# Patient Record
Sex: Female | Born: 1970 | Race: White | Hispanic: No | Smoking: Never smoker
Health system: Southern US, Community
[De-identification: ages and names within clinical notes are randomized; demographics above are authoritative.]

## PROBLEM LIST (undated history)

## (undated) DIAGNOSIS — I4719 Other supraventricular tachycardia: Secondary | ICD-10-CM

## (undated) DIAGNOSIS — F32A Depression, unspecified: Secondary | ICD-10-CM

## (undated) DIAGNOSIS — R51 Headache: Secondary | ICD-10-CM

## (undated) DIAGNOSIS — R000 Tachycardia, unspecified: Secondary | ICD-10-CM

## (undated) DIAGNOSIS — N200 Calculus of kidney: Secondary | ICD-10-CM

## (undated) DIAGNOSIS — R55 Syncope and collapse: Secondary | ICD-10-CM

## (undated) DIAGNOSIS — E785 Hyperlipidemia, unspecified: Secondary | ICD-10-CM

## (undated) DIAGNOSIS — I471 Supraventricular tachycardia: Secondary | ICD-10-CM

## (undated) DIAGNOSIS — T7840XA Allergy, unspecified, initial encounter: Secondary | ICD-10-CM

## (undated) DIAGNOSIS — R002 Palpitations: Secondary | ICD-10-CM

## (undated) DIAGNOSIS — I499 Cardiac arrhythmia, unspecified: Secondary | ICD-10-CM

## (undated) DIAGNOSIS — M1990 Unspecified osteoarthritis, unspecified site: Secondary | ICD-10-CM

## (undated) DIAGNOSIS — R519 Headache, unspecified: Secondary | ICD-10-CM

## (undated) DIAGNOSIS — IMO0001 Reserved for inherently not codable concepts without codable children: Secondary | ICD-10-CM

## (undated) HISTORY — DX: Depression, unspecified: F32.A

## (undated) HISTORY — DX: Tachycardia, unspecified: R00.0

## (undated) HISTORY — DX: Hyperlipidemia, unspecified: E78.5

## (undated) HISTORY — PX: TONSILLECTOMY: SUR1361

## (undated) HISTORY — DX: Unspecified osteoarthritis, unspecified site: M19.90

## (undated) HISTORY — DX: Palpitations: R00.2

## (undated) HISTORY — DX: Allergy, unspecified, initial encounter: T78.40XA

---

## 2004-08-21 ENCOUNTER — Ambulatory Visit: Payer: Self-pay

## 2005-10-12 ENCOUNTER — Ambulatory Visit: Payer: Self-pay

## 2005-11-15 ENCOUNTER — Ambulatory Visit: Payer: Self-pay | Admitting: Obstetrics and Gynecology

## 2005-11-21 ENCOUNTER — Encounter: Payer: Self-pay | Admitting: Internal Medicine

## 2005-11-28 ENCOUNTER — Ambulatory Visit: Payer: Self-pay

## 2006-02-26 ENCOUNTER — Observation Stay: Payer: Self-pay | Admitting: Certified Nurse Midwife

## 2006-02-26 ENCOUNTER — Ambulatory Visit: Payer: Self-pay | Admitting: Obstetrics and Gynecology

## 2006-02-27 ENCOUNTER — Ambulatory Visit: Payer: Self-pay | Admitting: Obstetrics and Gynecology

## 2006-04-07 ENCOUNTER — Observation Stay: Payer: Self-pay | Admitting: Obstetrics and Gynecology

## 2006-04-25 ENCOUNTER — Inpatient Hospital Stay: Payer: Self-pay | Admitting: Obstetrics and Gynecology

## 2008-09-19 ENCOUNTER — Ambulatory Visit: Payer: Self-pay | Admitting: Family Medicine

## 2009-08-09 ENCOUNTER — Ambulatory Visit: Payer: Self-pay | Admitting: General Practice

## 2009-08-15 ENCOUNTER — Encounter: Payer: Self-pay | Admitting: General Practice

## 2009-09-09 ENCOUNTER — Encounter: Payer: Self-pay | Admitting: General Practice

## 2009-10-09 ENCOUNTER — Encounter: Payer: Self-pay | Admitting: General Practice

## 2010-07-10 ENCOUNTER — Emergency Department: Payer: Self-pay | Admitting: Emergency Medicine

## 2010-07-13 ENCOUNTER — Encounter: Payer: Self-pay | Admitting: Internal Medicine

## 2010-07-13 ENCOUNTER — Observation Stay: Payer: Self-pay | Admitting: Internal Medicine

## 2010-07-14 ENCOUNTER — Encounter: Payer: Self-pay | Admitting: Internal Medicine

## 2010-07-18 ENCOUNTER — Ambulatory Visit (INDEPENDENT_AMBULATORY_CARE_PROVIDER_SITE_OTHER): Payer: PRIVATE HEALTH INSURANCE | Admitting: Internal Medicine

## 2010-07-18 ENCOUNTER — Encounter: Payer: Self-pay | Admitting: Internal Medicine

## 2010-07-18 DIAGNOSIS — I498 Other specified cardiac arrhythmias: Secondary | ICD-10-CM | POA: Insufficient documentation

## 2010-07-18 DIAGNOSIS — R0989 Other specified symptoms and signs involving the circulatory and respiratory systems: Secondary | ICD-10-CM

## 2010-07-18 DIAGNOSIS — I459 Conduction disorder, unspecified: Secondary | ICD-10-CM | POA: Insufficient documentation

## 2010-07-18 DIAGNOSIS — R0609 Other forms of dyspnea: Secondary | ICD-10-CM

## 2010-07-18 DIAGNOSIS — Q078 Other specified congenital malformations of nervous system: Secondary | ICD-10-CM

## 2010-07-18 DIAGNOSIS — R002 Palpitations: Secondary | ICD-10-CM | POA: Insufficient documentation

## 2010-07-20 ENCOUNTER — Telehealth: Payer: Self-pay | Admitting: Internal Medicine

## 2010-07-20 ENCOUNTER — Encounter: Payer: Self-pay | Admitting: Internal Medicine

## 2010-07-20 DIAGNOSIS — I514 Myocarditis, unspecified: Secondary | ICD-10-CM | POA: Insufficient documentation

## 2010-07-24 ENCOUNTER — Telehealth: Payer: Self-pay | Admitting: Internal Medicine

## 2010-07-25 ENCOUNTER — Other Ambulatory Visit: Payer: Self-pay | Admitting: Internal Medicine

## 2010-07-25 DIAGNOSIS — I514 Myocarditis, unspecified: Secondary | ICD-10-CM

## 2010-07-26 ENCOUNTER — Ambulatory Visit (HOSPITAL_COMMUNITY)
Admission: RE | Admit: 2010-07-26 | Discharge: 2010-07-26 | Disposition: A | Discharge status: Home / Self Care | Payer: PRIVATE HEALTH INSURANCE | Source: Ambulatory Visit | Attending: Internal Medicine | Admitting: Internal Medicine

## 2010-07-26 DIAGNOSIS — I514 Myocarditis, unspecified: Secondary | ICD-10-CM | POA: Insufficient documentation

## 2010-07-26 MED ORDER — GADOPENTETATE DIMEGLUMINE 469.01 MG/ML IV SOLN
27.0000 mL | Freq: Once | INTRAVENOUS | Status: AC
  Administered 2010-07-26: 27 mL via INTRAVENOUS

## 2010-07-27 NOTE — Letter (Signed)
Summary: Work Writer at [employer]. [address]   [place], [place] [address]   Phone: [phone]  Fax: [phone]     July 20, 2010    Lisa Clay   The above named patient had a doctor appointment 07/18/10 @ 9:30am. Upon assessment, patient may work 2 hour shift today for medical reasons from 9:00am - 11:00am and will call our office to notify us if she can tolerate.  Please take this into consideration when reviewing the time away from work.  Feel free to contact our office with any questions or concerns at [phone].    Sincerely yours,       Sherryl Manges, MD  [facility]

## 2010-07-27 NOTE — Assessment & Plan Note (Signed)
Summary: SINUS TACHYCARDIA/PALPITATIONS/Referred by Patel/#[phone]...   Visit Type:  Initial Consult Primary Provider:  Dr. Terance Hart  CC:  c/o SOB on exertion and palpitations and tachycardia.Marland Kitchen  History of Present Illness: Ms. Detwiler is referred by Dr. Allena Katz from [facility] because of tachycardia.  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 It was thought that her tachycardia might be related to Allegra and/or coffee.   In the wake of that, she noted that she remained  dyspneic with exertion and that this was associated with a rapid rate with heart rates recorded in the 130-40 range. She was seen in the emergency room on 2 occasions and is admitted on one because of the aforementioned symptoms. During these visits she underwent CT scanning to exclude pulmonary embolism, fluid resuscitation, 2-D echo that was normal including left ventricular and pulmonary artery pressures (see below).Her TSH and hemoglobin were also normal  She has found that her heart rate has continued to be a problem associated with dyspnea.  Most of the time these symptoms track together. She is also had some palpitations where her heart rate has only been 80-90 range.  She was started on metoprolol at 12.5 daily; this has been associated with some improvement in her heart rate but not in her overall symptoms.  She remains quite fatigued area and she is having difficulty with concentration. She has been sleeping much more than her normal 3-4 hours a night.  she has noted some recent orthostatic intolerance. She denies sour intolerance. Her urine color suggest that she is volume repleted; her diet is probably average her salt intake.  she denies significant psychosocial stress apart from what is secondary to her acute symptoms. She notes that she was taking an appetite suppressant from the summer  through December. This is true not withstanding the fact that she is quite. He denies anorexia and or bulimia as his only that she is "very conscientious about her weight".    Preventive Screening-Counseling & Management  Caffeine-Diet-Exercise     Does Patient Exercise: no  Current Medications (verified): 1)  Metoprolol Succinate 25 Mg Xr24h-Tab (Metoprolol Succinate) .... 1/2 Tablet Once Daily 2)  Ambien 5 Mg Tabs (Zolpidem Tartrate) .Marland Kitchen.. 1 To 2 Tablets At Bedtime As Needed 3)  Mirena 20 Mcg/24hr Iud (Levonorgestrel)  Allergies (verified): 1)  ! Erythromycin  Past History:  Past Medical History: Last updated: 07/17/2010 Palpitations-[facility]-07/2010 Sinus tachycardia-[facility]-07/2010  Family History: Last updated: 07/18/2010 Maternal Grandmother-massive MI age 38-deceased Father: Hypertension, High cholesterol Mother:Hypertension, High cholesterol  Social History: Last updated: 07/18/2010 Full Time Tobacco Use - No.  Alcohol Use - yes-occasional Drug Use - no single Regular Exercise - no  Risk Factors: Exercise: no (07/18/2010)  Risk Factors: Smoking Status: never (07/17/2010)  Past Surgical History: Tonsillectomy  Family History: Maternal Grandmother-massive MI age 38-deceased Father: Hypertension, High cholesterol Mother:Hypertension, High cholesterol  Social History: Full Time Tobacco Use - No.  Alcohol Use - yes-occasional Drug Use - no single Regular Exercise - no Does Patient Exercise:  no  Review of Systems       full review of systems was negative apart from a history of present illness and past medical history allergic diathesis and menstrual dysfunction   Vital Signs:  Patient profile:   40 year old female Height:      66 inches Weight:      124 pounds  BMI:     20.09 Pulse rate:   85 / minute BP supine:   127 / 80  (left arm) BP sitting:   138 / 88  (left arm) BP standing:   128 / 88  (left arm) Cuff size:   regular  Vitals Entered By:  Lysbeth Galas CMA (July 18, 2010 9:54 AM)  Serial Vital Signs/Assessments:  Time      Position  BP       Pulse  Resp  Temp     By                     118/81   102                   Lysbeth Galas CMA   Physical Exam  General:  Well developed, well nourished,quite thin middle-aged Caucasian female appearing her stated agein no acute distress. Head:  normal HEENT Neck:  supple without thyromegaly Chest Wall:  without kyphosis Lungs:  clear to auscultation Heart:  regular rate and rhythm without murmurs or gallops Abdomen:  soft with active bowel sounds with abdomen pulsation or hepatomegaly Msk:  without obvious skeletal defects Pulses:  intact distal pulses Extremities:  without clubbing cyanosis or edema Neurologic:  alert and oriented and grossly normal motor and sensory function Skin:  warm and dry Cervical Nodes:  without adenopathy Psych:  somewhat flat affect   EKG  Procedure date:  07/18/2010  Findings:      sinus rhythm at 81 Intervals 0.1-6.09/23 8 Axis is 92 Otherwise normal  EKG  Procedure date:  07/13/2010  Findings:      2 different with her cardiograms are compared. P Wave morphologies compared to today's heart rate 81 shows 80 negative P wave in lead V2 otherwise the vectors are quite consistent  Impression & Recommendations:  Problem # 1:  SINUS TACHYCARDIA (ICD-427.89) the patient has supraventricular tachycardia with a long RP configuration. P Wave morphologies suggest that these are quite similar to sinus. The multiple rates of her rapid heart rate that has been described and recorded make me think it is unlikely that this represents atrial ectopic focus which would more likely had a narrower heart rate range. the alternative explanation to a primary arrhythmia would be secondary sinus tachycardia. There is no obvious trigger for this raising the possibility that this represents a dysautonomic process. He is not consistent with POTS in at the symptoms  occur while flat and were not really ameliorated by hydration. There is an orthostatic component however. 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  The dyspnea may well be explained by the dysautonomic process. However, there may be something else that has been missed here that is more cardio respiratory in origin. The fact that her echo cardiogram was normal makes this less likely. I will talk with colleagues about whether a myocarditis process is possible in the absence of left ventricular dysfunction which might be identifiable by MRI scanning.  In addition we will undertake the use of an event recorder as the transition from offset to onset and back might help [place] understand the mechanism of her tachycardia.  I have given her information from the  NDRF website I've also asked her to increase her metoprolol from 12.5-25 mg a day. She'll let us know later this week as she is feeling is  released to return to work.  I have also emphasized the fact that Dysautonomic processes are frequently associated with psychosocial stress. Also have to explore whether her exposure to appetite suppressants maycontribute Her updated medication list for this problem includes:    Metoprolol Succinate 25 Mg Xr24h-tab (Metoprolol succinate) .Marland Kitchen... Take one tablet once daily  Problem # 2:  PALPITATIONS, RECURRENT (ICD-785.1) she has 2 different palpitations syndrome. However, I think they probably related more to the right heart rate range and a different mechanism Her updated medication list for this problem includes:    Metoprolol Succinate 25 Mg Xr24h-tab (Metoprolol succinate) .Marland Kitchen... 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 Take one tablet once daily  Patient Instructions: 1)  Your physician recommends that you schedule a follow-up appointment in:  4 weeks. 2)  Your physician has recommended you make the following change in your medication:  Increase Metoprolol from 12.5mg  to 25mg  take one tablet daily.  3)  Your physician has recommended that you wear an event monitor.  Event monitors are medical devices that record the heart's electrical activity. Doctors most often use these monitors to diagnose arrhythmias. Arrhythmias are problems with the speed or rhythm of the heartbeat. The monitor is a small, portable device. You can wear one while you do your normal daily activities. This is usually used to diagnose what is causing palpitations/syncope (passing out). Prescriptions: METOPROLOL SUCCINATE 25 MG XR24H-TAB (METOPROLOL SUCCINATE) take one tablet once daily  #30 x 6   Entered by:   Lanny Hurst RN   Authorized by:   Nathen May, MD, [facility]   Signed by:   Lanny Hurst RN on 07/18/2010   Method used:   Electronically to        [facility] (retail)       [address]       [place], [place]  [address]       Ph: [phone]       Fax: [phone]   RxID:   [id number]

## 2010-07-27 NOTE — Letter (Signed)
Summary: Work Writer at [employer]. [address]   [place], [place] [address]   Phone: [phone]  Fax: [phone]     July 20, 2010    Lisa Clay   The above named patient needs to have a sedentary position at work until further notice. If you have any questions or concerns you may call Dr. Graciela Husbands at [facility] in [place] at [phone].    Sincerely yours,       Sherryl Manges, MD  [facility]

## 2010-07-27 NOTE — Progress Notes (Addendum)
Summary: Needing a letter for work for restrictions  Phone Note Call from Patient Call back at [employer] [phone]   Caller: Self Call For: Gollan Summary of Call: Pt is calling to inform that she is still SOB after working for 2  hours.  Pt states that she does not want to be out of work completely.  Pt is inquiring whether she can have a note with modifications for work.  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  Pt is also calling about the appt for a cardiac MRI.  Pt would prefer to be able to have a release to do some level of work, but does not feel comfortable working at a normal level.  Pt has been having episodes of the SOB and palpitations on exertion only.  This was the pt's first time returning to work since her appt here.   Initial call taken by: Harlon Flor,  July 20, 2010 11:34 AM     Appended Document: Needing a letter for work for restrictions do not know patient. Will forward to Dr. Graciela Husbands

## 2010-07-27 NOTE — Letter (Signed)
Summary: Letter  Letter   Imported By: Harlon Flor 07/21/2010 15:50:06  _____________________________________________________________________  External Attachment:    Type:   Image     Comment:   External Document

## 2010-07-27 NOTE — Miscellaneous (Signed)
Summary: Cardiac MRI orders  Clinical Lists Changes  Problems: Added new problem of UNSPECIFIED MYOCARDITIS (ICD-429.0) Orders: Added new Referral order of Cardiac MRI (Cardiac MRI) - Signed

## 2010-07-30 DIAGNOSIS — I517 Cardiomegaly: Secondary | ICD-10-CM

## 2010-08-02 NOTE — Letter (Signed)
Summary: Letter for Work  Letter for Work   Imported By: Harlon Flor 07/24/2010 15:13:25  _____________________________________________________________________  External Attachment:    Type:   Image     Comment:   External Document

## 2010-08-02 NOTE — Progress Notes (Signed)
Summary: Event monitor  Phone Note Call from Patient   Caller: Patient Details for Reason: Monitor [phone] Summary of Call: She received the monitor and thought it would be continuous monitoring but this monitor is only when she has a spell to push the button. Please call after 12:00, she is in meetings all morning. Initial call taken by: Bishop Dublin, CMA,  July 24, 2010 9:44 AM  Follow-up for Phone Call        Attempted to contact pt, LMOM TCB Lanny Hurst RN  July 25, 2010 10:28 AM   Spoke to pt, notified her that the Cougar of hearts that Dr. Graciela Husbands ordered only records when pt has symptoms, it does not continuously record. Pt is concerned that the rhythms that are important are not being captured with this type of monitor. Pt also states since taking metoprolol that this has helped some. Will talk to Dr. Graciela Husbands today to see what he recommends. Follow-up by: Lanny Hurst RN,  July 25, 2010 10:45 AM  Additional Follow-up for Phone Call Additional follow up Details #1::        Spoke to Dr. Graciela Husbands, pt's event monitor was changed to Explorer with 412 seconds pre-recording and 27 sec post recording. Also talked to [facility], pt's cardiac MRI is scheduled for tomorrow 07/26/10 @ 2:00pm. Attempted to call pt with this information, LMOM TCB Lanny Hurst RN  July 25, 2010 2:59 PM   Notified pt of above, pt will go for MRI tomorrow at [facility]. Additional Follow-up by: Lanny Hurst RN,  July 25, 2010 4:01 PM

## 2010-08-08 ENCOUNTER — Telehealth: Payer: Self-pay | Admitting: Internal Medicine

## 2010-08-15 ENCOUNTER — Encounter: Payer: Self-pay | Admitting: Internal Medicine

## 2010-08-15 ENCOUNTER — Ambulatory Visit (INDEPENDENT_AMBULATORY_CARE_PROVIDER_SITE_OTHER): Payer: PRIVATE HEALTH INSURANCE | Admitting: Internal Medicine

## 2010-08-15 DIAGNOSIS — Q078 Other specified congenital malformations of nervous system: Secondary | ICD-10-CM

## 2010-08-16 ENCOUNTER — Telehealth: Payer: Self-pay | Admitting: Internal Medicine

## 2010-08-16 ENCOUNTER — Encounter: Payer: Self-pay | Admitting: Internal Medicine

## 2010-08-22 NOTE — Letter (Signed)
Summary: FMLA papers  FMLA papers   Imported By: Harlon Flor 08/16/2010 16:05:08  _____________________________________________________________________  External Attachment:    Type:   Image     Comment:   External Document

## 2010-08-22 NOTE — Letter (Signed)
SummaryScientist, [facility]   [facility]   Imported By: Roderic Ovens 08/10/2010 11:28:20  _____________________________________________________________________  External Attachment:    Type:   Image     Comment:   External Document

## 2010-08-22 NOTE — Progress Notes (Signed)
Summary: Pulmonary appt  Phone Note Call from Patient Call back at Home Phone [phone]   Caller: Patient Call For: Nurse Summary of Call: Pt called wanting to be referred to pulmonary. Let pt know I will call her with appt. She wants to be referred to Dr. Meredeth Ide. Appt is made for 08/25/10 @9 :15am. pt notified/sab Initial call taken by: Lysbeth Galas CMA,  August 16, 2010 3:59 PM

## 2010-08-22 NOTE — Letter (Signed)
Summary: Generic Engineer, agricultural at [address]   [place], [place] [address]   Phone: [phone]  Fax: [phone]    08/16/2010  Lisa Clay [address] [place], [place]  [address]  [place]  Dear Ms. [name],    We have you scheduled with Dr. Luella Cook on 09/04/2010 @ 10:30. Arrive 15 minutes before appointment.   Address: [facility], [place] [address]  Phone number: [phone]      Sincerely,   Lysbeth Galas CMA

## 2010-08-22 NOTE — Progress Notes (Signed)
Summary: Wants call back  Phone Note Call from Patient Call back at Home Phone [phone]   Caller: Patient Call For: Lisa Clay/Megan Summary of Call: pt states she wants to talk to you. She states that she hasn't received a call from Dr. Graciela Husbands.  Initial call taken by: Lysbeth Galas CMA,  August 08, 2010 9:05 AM  Follow-up for Phone Call        saw pt in office today Follow-up by: Nathen May, MD, [facility],  August 15, 2010 6:17 PM

## 2010-08-22 NOTE — Letter (Signed)
Summary: Work Writer at [employer]. [address]   [place], [place] [address]   Phone: [phone]  Fax: [phone]     August 15, 2010    Lisa Clay   The above named patient had a medical visit today at:   08/15/10  @11  am.  Please take this into consideration when reviewing the time away from work until further notice.     Sincerely yours,  Architectural technologist

## 2010-08-22 NOTE — Letter (Signed)
Summary: [facility] Cardiology Consult, 24 Hour Holter Report   [facility] Cardiology Consult, 24 Hour Holter Report   Imported By: Roderic Ovens 08/10/2010 11:27:35  _____________________________________________________________________  External Attachment:    Type:   Image     Comment:   External Document

## 2010-08-22 NOTE — Assessment & Plan Note (Signed)
Summary: F4W/AMD   Visit Type:  Follow-up Primary Provider:  Dr. Terance Clay  CC:  "doing well". No problems since f/u. Denies chest pain and SOB.Marland Kitchen  History of Present Illness: Lisa Clay is seen in followup for tachycardia palpitations dyspnea on exertion that you've lost rather acutely about 6 or 8 weeks ago following a viral illness. Cardiac MRI failed to demonstrate any evidence of myocardial disease. As it recurs thus far with incomplete due to have failed to demonstrate anything besides gradually changing heart rates consistent with sinus tachycardia and my working diagnosis has been dysautonomia. [name] been tried on low-dose beta blockers. He is working on salt and water repletion.  SHe comes in today frustrated and angry the latter related to poor communication from our office which I am probably responsible and has been apologetic. She is also remains frustrated with her diagnosis and ongoing symptoms. She continues to have significant dyspnea with exertion and hence is concerned about being able to return to work  Current Medications (verified): 1)  Metoprolol Succinate 25 Mg Xr24h-Tab (Metoprolol Succinate) .... Take One Tablet Once Daily 2)  Ambien 5 Mg Tabs (Zolpidem Tartrate) .Marland Kitchen.. 1 To 2 Tablets At Bedtime As Needed 3)  Mirena 20 Mcg/24hr Iud (Levonorgestrel)  Allergies (verified): 1)  ! Erythromycin  Past History:  Past Medical History: Last updated: 07/17/2010 Palpitations-[facility]-07/2010 Sinus tachycardia-[facility]-07/2010  Past Surgical History: Last updated: 07/18/2010 Tonsillectomy  Family History: Last updated: 07/18/2010 Maternal Grandmother-massive MI age 76-deceased Father: Hypertension, High cholesterol Mother:Hypertension, High cholesterol  Social History: Last updated: 07/18/2010 Full Time Tobacco Use - No.  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 We spent about 30-35 minutes discussing the physiology the potential relationship to viral illness and outcomes. Also discussed treatments. We reviewed her excursion into NDRFc website. I think that there is more information there that she would benefit from.  She is quite unsettled with the diagnosis. I have told her given her dyspnea that she should see pulmonary. She will look into whether she wants to have it done here in [name] or with [name];  we will also refer her to Lisa Clay at [facility] for another opinion related to her tachycardia palpitations.  Patient Instructions: 1)  Your physician recommends that you continue on your current medications as directed. Please refer to the Current Medication list given to you today. 2)  Will call Lisa Clay office at [facility] to set you up with a appointment, appt set up for 3/26 @ 10:30 w/ LisaSimmons.

## 2010-08-25 ENCOUNTER — Other Ambulatory Visit: Payer: Self-pay | Admitting: Specialist

## 2010-08-29 ENCOUNTER — Telehealth: Payer: Self-pay | Admitting: Internal Medicine

## 2010-08-29 NOTE — Telephone Encounter (Signed)
Pt is f/u on the status of her short term disability papers.  The return date to work was missing and this was discussed with [place] Dec.  Pt stated that she was to receive a return call to inform her of once this has been corrected.  Pt has not heard anything in a week from our office. Please advise.

## 2010-08-29 NOTE — Telephone Encounter (Signed)
Pt. Called regarding short term disability papers. Left message to call back.

## 2010-08-31 ENCOUNTER — Ambulatory Visit: Payer: Self-pay | Admitting: Specialist

## 2010-08-31 NOTE — Telephone Encounter (Signed)
Please tell pt did not know how to fill in return to work status;  Anticipated that she was seeing Dr Sharol Harness at [facility]  3/26  And would suggest we wait for his opinion Thank steve

## 2010-09-01 NOTE — Telephone Encounter (Signed)
FYI

## 2010-09-01 NOTE — Telephone Encounter (Signed)
Spoke with patient and form not filled out all the way. Spoke with Dr.Klein and he wants to wait till pt sees Dr.Simmons at [facility] with patient and she understands that she needs to wait till after the visit with Dr.Simmons. Also patient is having last office visit, procedures, and lab work faxed over to our office from Dr. Reita Cliche office.

## 2010-09-19 ENCOUNTER — Other Ambulatory Visit: Payer: Self-pay | Admitting: General Practice

## 2010-09-28 ENCOUNTER — Telehealth: Payer: Self-pay | Admitting: Emergency Medicine

## 2010-09-28 NOTE — Telephone Encounter (Signed)
Pt called in today regarding Dr.Simmons. She states that Dr.Simmons was supposed to contact you (Dr.Klein) and Dr.Fleming regarding her care. Per Maryellen he has not done that. She was supposed to have a f/u appt with him in the next week or so but Dr.Simmons is out of town. The office won't give her a f/u appt. She feels like she is getting the run around with them. She wanted to know your recommendations. Pt is very frustrated.

## 2010-10-04 NOTE — Telephone Encounter (Signed)
Lisa Clay, I have little to add here unfortunately;  I dont know what Dr Sharol Harness said and she was fairly unsatisfied with my diagnosis and insights I look ofroward to hearing from Dr Sharol Harness

## 2010-11-02 ENCOUNTER — Other Ambulatory Visit: Payer: Self-pay | Admitting: Rheumatology

## 2010-12-22 ENCOUNTER — Encounter: Payer: Self-pay | Admitting: Internal Medicine

## 2011-02-15 ENCOUNTER — Telehealth: Payer: Self-pay | Admitting: Internal Medicine

## 2011-02-15 NOTE — Telephone Encounter (Signed)
Patient called and wasn't sure what she needs to do.  She was referred to Dr. Sharol Harness at [facility] for EP by DR. Graciela Husbands.  She is very unhappy with the care she has received there and is having recurring symptoms on the medication and has run into a difficult time even getting an appointment with DR. Simmons.  She does not plan on going back there and wants to know if DR. Graciela Husbands could suggest another EP specialist or can Dr. Graciela Husbands see her again.  Her heart rate has been on and off running in the 140's to 150s.

## 2011-02-15 NOTE — Telephone Encounter (Signed)
Notified patient need to increase her salt intake due to decrease blood pressure when taking the extra 1/2 metoprolol for increased heart rate.  Told her to keep a record of blood pressure readings & heart rate for one week.  She will bring the readings to the office next Thursday. She was told to contact our office or go to the ER if she gets worse.  We will decide then if need to make a follow up appointment with Dr. Mariah Milling after she has brought the readings by.  Dr. Mariah Milling had mentioned [facility] but will need to see her first.  She does not want to see Dr. Sharol Harness for EP anymore, she is not very happy with the practice at this time.  She would like to see Dr. Graciela Husbands again or another EP at [facility]. I told her to follow up with Dr. Sharol Harness first since he had mentioned some further testing. She refused to call Dr. Sharol Harness office back.

## 2011-03-21 ENCOUNTER — Other Ambulatory Visit: Payer: Self-pay | Admitting: *Deleted

## 2011-03-21 MED ORDER — METOPROLOL SUCCINATE ER 25 MG PO TB24: 25.0000 mg | ORAL_TABLET | Freq: Every day | ORAL | Status: DC

## 2011-04-20 ENCOUNTER — Ambulatory Visit: Payer: Self-pay | Admitting: Specialist

## 2011-04-24 HISTORY — PX: CARDIAC ELECTROPHYSIOLOGY STUDY AND ABLATION: SHX1294

## 2011-05-21 ENCOUNTER — Other Ambulatory Visit: Payer: Self-pay | Admitting: Family Medicine

## 2011-05-28 DIAGNOSIS — I499 Cardiac arrhythmia, unspecified: Secondary | ICD-10-CM | POA: Insufficient documentation

## 2011-06-09 ENCOUNTER — Ambulatory Visit: Payer: Self-pay | Admitting: Family Medicine

## 2011-06-15 ENCOUNTER — Other Ambulatory Visit: Payer: Self-pay

## 2011-06-15 MED ORDER — METOPROLOL SUCCINATE ER 25 MG PO TB24: 25.0000 mg | ORAL_TABLET | Freq: Every day | ORAL | Status: DC

## 2011-08-30 DIAGNOSIS — R000 Tachycardia, unspecified: Secondary | ICD-10-CM | POA: Insufficient documentation

## 2011-09-08 ENCOUNTER — Other Ambulatory Visit: Payer: Self-pay

## 2011-09-08 LAB — BASIC METABOLIC PANEL
Co2: 26 mmol/L (ref 21–32)
Creatinine: 0.79 mg/dL (ref 0.60–1.30)
EGFR (Non-African Amer.): >60
Osmolality: 287 (ref 275–301)

## 2012-09-05 DIAGNOSIS — R000 Tachycardia, unspecified: Secondary | ICD-10-CM | POA: Insufficient documentation

## 2012-09-05 DIAGNOSIS — I4892 Unspecified atrial flutter: Secondary | ICD-10-CM | POA: Insufficient documentation

## 2012-09-05 DIAGNOSIS — D8989 Other specified disorders involving the immune mechanism, not elsewhere classified: Secondary | ICD-10-CM | POA: Insufficient documentation

## 2013-04-15 ENCOUNTER — Ambulatory Visit: Payer: Self-pay | Admitting: Family Medicine

## 2013-04-24 ENCOUNTER — Ambulatory Visit: Payer: Self-pay | Admitting: Family Medicine

## 2013-05-04 ENCOUNTER — Ambulatory Visit: Payer: Self-pay | Admitting: Family Medicine

## 2013-05-11 HISTORY — PX: BREAST BIOPSY: SHX20

## 2013-10-29 ENCOUNTER — Encounter: Payer: Self-pay | Admitting: Internal Medicine

## 2014-05-11 DIAGNOSIS — R55 Syncope and collapse: Secondary | ICD-10-CM | POA: Insufficient documentation

## 2014-12-29 DIAGNOSIS — M7581 Other shoulder lesions, right shoulder: Secondary | ICD-10-CM

## 2014-12-29 DIAGNOSIS — M7550 Bursitis of unspecified shoulder: Secondary | ICD-10-CM | POA: Insufficient documentation

## 2014-12-29 DIAGNOSIS — M778 Other enthesopathies, not elsewhere classified: Secondary | ICD-10-CM | POA: Insufficient documentation

## 2015-01-03 ENCOUNTER — Ambulatory Visit: Payer: Medicaid Other | Attending: Surgery | Admitting: Physical Therapy

## 2015-01-03 DIAGNOSIS — M6281 Muscle weakness (generalized): Secondary | ICD-10-CM | POA: Diagnosis not present

## 2015-01-03 DIAGNOSIS — M25511 Pain in right shoulder: Secondary | ICD-10-CM | POA: Insufficient documentation

## 2015-01-03 DIAGNOSIS — M25611 Stiffness of right shoulder, not elsewhere classified: Secondary | ICD-10-CM | POA: Diagnosis not present

## 2015-01-04 ENCOUNTER — Encounter: Payer: Self-pay | Admitting: Physical Therapy

## 2015-01-04 NOTE — Therapy (Signed)
[facility] [address] [place], [place], [address] Phone: [phone]   Fax:  [phone]  Physical Therapy Evaluation  Patient Details  Name: Lisa Clay MRN: [id number] Date of Birth: Oct 23, 1970 Referring Provider:  Christena Flake, MD  Encounter Date: 01/03/2015      PT End of Session - 01/04/15 1905    Visit Number 11   PT Start Time 1038   PT Stop Time 1135   PT Time Calculation (min) 57 min   Activity Tolerance Patient tolerated treatment well;Patient limited by pain   Behavior During Therapy [facility] for tasks assessed/performed      Past Medical History  Diagnosis Date  . Palpitations     [facility] 2/12  . Sinus tachycardia     [facility] 2/12    Past Surgical History  Procedure Laterality Date  . Tonsillectomy      There were no vitals filed for this visit.  Visit Diagnosis:  Right shoulder pain  Stiffness of joint, shoulder region, right  Muscle weakness      Subjective Assessment - 01/04/15 1851    Subjective Pt. reports severe R shoulder pain with movement/ sleeping position.  Pt. states pain started 2 months ago with no known MOI.  Pt. 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  Limited PT in past secondary to financial issues.  Medicaid only allows 1 PT visit/calendar year.     Limitations Lifting;House hold activities;Other (comment)   Diagnostic tests No MRI   Patient Stated Goals Decrease R shoulder pain/ increase ROM.     Currently in Pain? Yes   Pain Score 7    Pain Location Shoulder   Pain Orientation Right   Pain Descriptors / Indicators Aching;Constant;Sharp;Radiating;Stabbing;Tightness   Pain Type Chronic pain   Pain Onset More than a month ago   Pain Frequency Constant   Aggravating Factors  reaching/ household tasks/ dressing/ bathing/ sleeping   Pain Relieving Factors Nothing.  Pt. reports having injection last week in R shoulder with  no benefit.              [facility] PT Assessment - 01/04/15 0001    Assessment   Medical Diagnosis R shoulder bursitis   Hand Dominance Right   Prior Therapy PT for L shoulder last year (limited visits).            PT Education - 01/04/15 1904    Education provided Yes   Education Details Issued HEP/ sh. pulleys for ROM/ discussed benefits of ice/heat/ [employer] clinic referral.   Person(s) Educated Patient   Methods Explanation;Demonstration;Handout   Comprehension Verbalized understanding;Returned demonstration            Plan - 01/04/15 1847    Clinical Impression Statement Pt. is a 44 y/o female with h/o R shoulder pain and recent c/o L shoulder joint stiffness/severe pain with no known MOI.  Decrease R shoulder AROM: flexion 130 deg., abd. 95 deg., IR to R PSIS (sharp pain reported).  Significant c/o pain with PROM but pt. able to progress AAROM with use of sh. pulleys in seated position. Cervical AROM WFL (no reproduction of R sh. symptoms).  (+) Hawkins, (+) Neers impingement, (-) apprehension, (-) empty can test. Pt. currently on disability for cardiac issues and limited all ADLS/ household tasks involving R shoulder.  QuickDASH: 63.6%.  Pt. will benefit from daily completion of HEP and referral to [facility] at [facility] DPT program to decrease R shoulder pain/ increase pain-free ROM.      Pt will benefit from skilled therapeutic intervention in order to improve on the following deficits Hypomobility;Decreased activity tolerance;Decreased strength;Pain;Decreased mobility;Decreased range of motion;Improper body mechanics   PT Frequency 1x / week   PT Treatment/Interventions ADLs/Self Care Home Management;Cryotherapy;Electrical Stimulation;Iontophoresis 4mg /ml Dexamethasone;Moist Heat;Therapeutic exercise;Functional mobility training;Dry needling;Patient/family education;Passive range of motion   PT Next Visit Plan Pt. instructed to call PT with any further  questions and f/u with [facility] clinic at [facility].    PT Home Exercise Plan see handouts.         Problem List Patient Active Problem List   Diagnosis Date Noted  . UNSPECIFIED MYOCARDITIS 07/20/2010  . SINUS TACHYCARDIA 07/18/2010  . DYSAUTONOMIA 07/18/2010  . PALPITATIONS, RECURRENT 07/18/2010  . DYSPNEA ON EXERTION 07/18/2010   Cammie Mcgee, PT, DPT # [phone]   01/04/2015, 7:16 PM  [facility] [address] [place], [place], [address] Phone: [phone]   Fax:  [phone]

## 2015-02-03 ENCOUNTER — Ambulatory Visit (INDEPENDENT_AMBULATORY_CARE_PROVIDER_SITE_OTHER): Payer: Medicaid Other | Admitting: Urology

## 2015-02-03 ENCOUNTER — Ambulatory Visit: Payer: Self-pay

## 2015-02-03 VITALS — BP 127/77 | HR 71 | Ht 66.0 in | Wt 117.1 lb

## 2015-02-03 DIAGNOSIS — N200 Calculus of kidney: Secondary | ICD-10-CM

## 2015-02-03 LAB — URINALYSIS, COMPLETE
Bilirubin, UA: POSITIVE — AB
Glucose, UA: NEGATIVE
Ketones, UA: NEGATIVE
NITRITE UA: NEGATIVE
Specific Gravity, UA: 1.025 (ref 1.005–1.030)
Urobilinogen, Ur: 1 mg/dL (ref 0.2–1.0)
pH, UA: 6 (ref 5.0–7.5)

## 2015-02-03 LAB — MICROSCOPIC EXAMINATION: Epithelial Cells (non renal): >10 /hpf — ABNORMAL HIGH (ref 0–10)

## 2015-02-03 NOTE — Progress Notes (Signed)
H&P  Chief Complaint: kidney stone  History of Present Illness: Lisa Clay is a 44 y.o. year old female who presents to our office for evaluation and management recent ureteral calculi. She became symptomatic approximately 8 days ago with sudden onset of lower abdominal/pressure, frequency, urgency, pain after urination as well as eventually hematuria. She denied any fever, chills, nausea, vomiting, lateralizing flank or abdominal pain. She denies prior history of kidney stones. She does have a family history of gout.  She began passing quite a few small fragments of stones couple of days ago. She brings those with her. She denies significant persistent lower urinary tract/abdominal symptoms. She denies any change in bowel habits. She denies frequent urinary tract infections.  Past Medical History  Diagnosis Date  . Palpitations     [facility] 2/12  . Sinus tachycardia     [facility] 2/12    Past Surgical History  Procedure Laterality Date  . Tonsillectomy      Home Medications:   (Not in a hospital admission)  Allergies:  Allergies  Allergen Reactions  . Erythromycin     Family History  Problem Relation Age of Onset  . Heart attack Maternal Grandmother 50    deceased  . Hypertension Father     and high cholesterol   . Hypertension Mother     and high cholesterol     Social History:  reports that she drinks alcohol. She reports that she does not use illicit drugs. Her tobacco history is not on file.  ROS: A complete review of systems was performed.  All systems are negative except for pertinent findings as noted.  Physical Exam:  Vital signs in last 24 hours: @ General:  Alert and oriented, No acute distress HEENT: Normocephalic, atraumatic Neck: No JVD or lymphadenopathy Cardiovascular: Regular rate and rhythm Lungs: normal respiratory rate, normal inspiratory effort. Abdomen: Soft, nontender, nondistended, no abdominal massesscaphoid Back: No CVA  tenderness Extremities: No edema Neurologic: Grossly intact  Laboratory Data:  No results found for this or any previous visit (from the past 24 hour(s)). No results found for this or any previous visit (from the past 240 hour(s)).   Patient did not collect clean-catch urine Creatinine No results for input(s): CREATININE in the last 168 hours.  Radiologic Imaging: No results found.   I reviewed her KUB results-she was seen to have a possible right renal stone  Impression/Assessment:  Multiple small ureteral calculi, passed. She is currently asymptomatic. We do not have a current urinalysis on her. Question history of right renal stone based on her KUB. Based on the appearance of her calculi which are tan in color, as well as the multiple small fragments, question whether these are uric acid, especially with family history of gout.  Plan:  1. We will send stones for analysis  2. I will send her out with a 24-hour urine litho-link request  3. I would like her to come back in 6 weeks following renal ultrasound for upper tract calculi  4. I did discuss with patient effect these may well be uric acid calculi  5. She does have a normal serum calcium having been done recently.  Chelsea Aus 02/03/2015, 9:23 AM  Bertram Millard. Markees Carns MD

## 2015-02-08 ENCOUNTER — Ambulatory Visit: Payer: Self-pay | Admitting: Obstetrics and Gynecology

## 2015-02-11 ENCOUNTER — Other Ambulatory Visit: Payer: Self-pay | Admitting: Urology

## 2015-02-11 DIAGNOSIS — N200 Calculus of kidney: Secondary | ICD-10-CM

## 2015-02-15 ENCOUNTER — Other Ambulatory Visit: Payer: Medicaid Other

## 2015-02-15 ENCOUNTER — Encounter: Payer: Self-pay | Admitting: *Deleted

## 2015-02-15 ENCOUNTER — Ambulatory Visit
Admission: RE | Admit: 2015-02-15 | Discharge: 2015-02-15 | Disposition: A | Discharge status: Home / Self Care | Payer: Medicaid Other | Source: Ambulatory Visit | Attending: Urology | Admitting: Urology

## 2015-02-15 DIAGNOSIS — N200 Calculus of kidney: Secondary | ICD-10-CM | POA: Insufficient documentation

## 2015-02-16 ENCOUNTER — Ambulatory Visit: Payer: Medicaid Other | Admitting: Anesthesiology

## 2015-02-16 ENCOUNTER — Other Ambulatory Visit: Payer: Self-pay | Admitting: Urology

## 2015-02-16 ENCOUNTER — Ambulatory Visit
Admission: RE | Admit: 2015-02-16 | Discharge: 2015-02-16 | Disposition: A | Discharge status: Home / Self Care | Payer: Medicaid Other | Source: Ambulatory Visit | Attending: Surgery | Admitting: Surgery

## 2015-02-16 ENCOUNTER — Encounter
Admission: RE | Disposition: A | Discharge status: Home / Self Care | Payer: Self-pay | Source: Ambulatory Visit | Attending: Surgery

## 2015-02-16 DIAGNOSIS — Z881 Allergy status to other antibiotic agents status: Secondary | ICD-10-CM | POA: Diagnosis not present

## 2015-02-16 DIAGNOSIS — G901 Familial dysautonomia [Riley-Day]: Secondary | ICD-10-CM | POA: Insufficient documentation

## 2015-02-16 DIAGNOSIS — M7501 Adhesive capsulitis of right shoulder: Secondary | ICD-10-CM | POA: Diagnosis present

## 2015-02-16 DIAGNOSIS — M778 Other enthesopathies, not elsewhere classified: Secondary | ICD-10-CM

## 2015-02-16 DIAGNOSIS — M7581 Other shoulder lesions, right shoulder: Secondary | ICD-10-CM

## 2015-02-16 DIAGNOSIS — M7500 Adhesive capsulitis of unspecified shoulder: Secondary | ICD-10-CM | POA: Insufficient documentation

## 2015-02-16 HISTORY — DX: Syncope and collapse: R55

## 2015-02-16 HISTORY — DX: Reserved for inherently not codable concepts without codable children: IMO0001

## 2015-02-16 HISTORY — PX: CLOSED MANIPULATION SHOULDER WITH STERIOD INJECTION: SHX5611

## 2015-02-16 HISTORY — DX: Calculus of kidney: N20.0

## 2015-02-16 HISTORY — DX: Cardiac arrhythmia, unspecified: I49.9

## 2015-02-16 HISTORY — DX: Headache: R51

## 2015-02-16 HISTORY — DX: Headache, unspecified: R51.9

## 2015-02-16 SURGERY — CLOSED MANIPULATION SHOULDER WITH STEROID INJECTION
Anesthesia: Regional | Laterality: Right | Wound class: Clean

## 2015-02-16 MED ORDER — ROPIVACAINE HCL 5 MG/ML IJ SOLN
INTRAMUSCULAR | Status: DC | PRN
  Administered 2015-02-16 (×2): 20 mL via PERINEURAL

## 2015-02-16 MED ORDER — OXYCODONE HCL 5 MG PO TABS: 5.0000 mg | ORAL_TABLET | ORAL | Status: DC | PRN

## 2015-02-16 MED ORDER — BUPIVACAINE-EPINEPHRINE 0.25% -1:200000 IJ SOLN
INTRAMUSCULAR | Status: DC | PRN
  Administered 2015-02-16: 9 mL

## 2015-02-16 MED ORDER — TRAMADOL HCL 50 MG PO TABS: 50.0000 mg | ORAL_TABLET | Freq: Four times a day (QID) | ORAL | Status: DC | PRN

## 2015-02-16 MED ORDER — MIDAZOLAM HCL 2 MG/2ML IJ SOLN
INTRAMUSCULAR | Status: DC | PRN
  Administered 2015-02-16: 2 mg via INTRAVENOUS

## 2015-02-16 MED ORDER — TRIAMCINOLONE ACETONIDE 40 MG/ML IJ SUSP
INTRAMUSCULAR | Status: DC | PRN
  Administered 2015-02-16: 40 mg

## 2015-02-16 MED ORDER — LIDOCAINE HCL (CARDIAC) 20 MG/ML IV SOLN
INTRAVENOUS | Status: DC | PRN
  Administered 2015-02-16: 40 mg via INTRAVENOUS

## 2015-02-16 MED ORDER — POTASSIUM CHLORIDE IN NACL 20-0.9 MEQ/L-% IV SOLN: INTRAVENOUS | Status: DC

## 2015-02-16 MED ORDER — OXYCODONE HCL 5 MG PO TABS: 5.0000 mg | ORAL_TABLET | Freq: Once | ORAL | Status: DC | PRN

## 2015-02-16 MED ORDER — METOCLOPRAMIDE HCL 5 MG PO TABS: 5.0000 mg | ORAL_TABLET | Freq: Three times a day (TID) | ORAL | Status: DC | PRN

## 2015-02-16 MED ORDER — ONDANSETRON HCL 4 MG/2ML IJ SOLN: 4.0000 mg | Freq: Four times a day (QID) | INTRAMUSCULAR | Status: DC | PRN

## 2015-02-16 MED ORDER — OXYCODONE HCL 5 MG/5ML PO SOLN: 5.0000 mg | Freq: Once | ORAL | Status: DC | PRN

## 2015-02-16 MED ORDER — FENTANYL CITRATE (PF) 100 MCG/2ML IJ SOLN
INTRAMUSCULAR | Status: DC | PRN
  Administered 2015-02-16: 100 ug via INTRAVENOUS

## 2015-02-16 MED ORDER — METOCLOPRAMIDE HCL 5 MG/ML IJ SOLN: 5.0000 mg | Freq: Three times a day (TID) | INTRAMUSCULAR | Status: DC | PRN

## 2015-02-16 MED ORDER — ONDANSETRON HCL 4 MG PO TABS: 4.0000 mg | ORAL_TABLET | Freq: Four times a day (QID) | ORAL | Status: DC | PRN

## 2015-02-16 MED ORDER — LACTATED RINGERS IV SOLN
INTRAVENOUS | Status: DC
  Administered 2015-02-16: 13:00:00 via INTRAVENOUS

## 2015-02-16 MED ORDER — PROPOFOL 10 MG/ML IV BOLUS
INTRAVENOUS | Status: DC | PRN
  Administered 2015-02-16: 100 mg via INTRAVENOUS

## 2015-02-16 SURGICAL SUPPLY — 8 items
BNDG ADH 2 X3.75 FABRIC TAN LF (GAUZE/BANDAGES/DRESSINGS) ×3 IMPLANT
NEEDLE HYPO 21X1.5 SAFETY (NEEDLE) IMPLANT
PAD ALCOHOL SWAB (MISCELLANEOUS) IMPLANT
SLING ARM LRG DEEP (SOFTGOODS) IMPLANT
SLING ARM M TX990204 (SOFTGOODS) ×3 IMPLANT
SLING ARM S TX990203 (SOFTGOODS) IMPLANT
STRAP BODY AND KNEE 60X3 (MISCELLANEOUS) ×3 IMPLANT
SYRINGE 10CC LL (SYRINGE) IMPLANT

## 2015-02-16 NOTE — Discharge Instructions (Signed)
General Anesthesia, Care After °Refer to this sheet in the next few weeks. These instructions provide you with information on caring for yourself after your procedure. Your health care provider may also give you more specific instructions. Your treatment has been planned according to current medical practices, but problems sometimes occur. Call your health care provider if you have any problems or questions after your procedure. °WHAT TO EXPECT AFTER THE PROCEDURE °After the procedure, it is typical to experience: °· Sleepiness. °· Nausea and vomiting. °HOME CARE INSTRUCTIONS °· For the first 24 hours after general anesthesia: °¨ Have a responsible person with you. °¨ Do not drive a car. If you are alone, do not take public transportation. °¨ Do not drink alcohol. °¨ Do not take medicine that has not been prescribed by your health care provider. °¨ Do not sign important papers or make important decisions. °¨ You may resume a normal diet and activities as directed by your health care provider. °· Change bandages (dressings) as directed. °· If you have questions or problems that seem related to general anesthesia, call the hospital and ask for the anesthetist or anesthesiologist on call. °SEEK MEDICAL CARE IF: °· You have nausea and vomiting that continue the day after anesthesia. °· You develop a rash. °SEEK IMMEDIATE MEDICAL CARE IF:  °· You have difficulty breathing. °· You have chest pain. °· You have any allergic problems. °Document Released: 09/03/2000 Document Revised: 06/02/2013 Document Reviewed: 12/11/2012 °ExitCare® Patient Information ©2015 [employer]. This information is not intended to replace advice given to you by your health care provider. Make sure you discuss any questions you have with your health care provider. ° °Keep sling on until arm function returns, then remove sling and use arm as much as possible. °Apply ice to affected area frequently. °Begin physical therapy as scheduled. °Return for  follow-up in 10-14 days or as scheduled. ° °

## 2015-02-16 NOTE — Anesthesia Preprocedure Evaluation (Signed)
Anesthesia Evaluation  Patient identified by MRN, date of birth, ID band  Reviewed: NPO status   History of Anesthesia Complications Negative for: history of anesthetic complications  Airway Mallampati: II  TM Distance: >3 FB Neck ROM: full    Dental no notable dental hx.    Pulmonary neg pulmonary ROS,    Pulmonary exam normal        Cardiovascular Normal cardiovascular exam+ dysrhythmias (tachy > ablated 2012)      Neuro/Psych  Headaches, Near Syncope ; DYSAUTONOMIA; negative psych ROS   GI/Hepatic negative GI ROS, Neg liver ROS,   Endo/Other  negative endocrine ROS  Renal/GU negative Renal ROS  negative genitourinary   Musculoskeletal   Abdominal   Peds  Hematology negative hematology ROS (+)   Anesthesia Other Findings Pt refused urine HCG.  Reproductive/Obstetrics                             Anesthesia Physical Anesthesia Plan  ASA: II  Anesthesia Plan: Regional   Post-op Pain Management:    Induction:   Airway Management Planned:   Additional Equipment:   Intra-op Plan:   Post-operative Plan:   Informed Consent: I have reviewed the patients History and Physical, chart, labs and discussed the procedure including the risks, benefits and alternatives for the proposed anesthesia with the patient or authorized representative who has indicated his/her understanding and acceptance.     Plan Discussed with: CRNA  Anesthesia Plan Comments: (Interscalene block)        Anesthesia Quick Evaluation

## 2015-02-16 NOTE — Anesthesia Procedure Notes (Addendum)
Anesthesia Regional Block:  Interscalene brachial plexus block  Pre-Anesthetic Checklist: ,, timeout performed, Correct Patient, Correct Site, Correct Laterality, Correct Procedure, Correct Position, site marked, Risks and benefits discussed,  Surgical consent,  Pre-op evaluation,  At surgeon's request and post-op pain management  Laterality: Right  Prep: chloraprep       Needles:  Injection technique: Single-shot  Needle Type: Stimiplex     Needle Length: 10cm 10 cm Needle Gauge: 21 and 21 G    Additional Needles:  Procedures: ultrasound guided (picture in chart) Interscalene brachial plexus block  Nerve Stimulator or Paresthesia:  Response: shoulder twitch, 0.5 mA,   Additional Responses:   Narrative:  Start time: 02/16/2015 12:51 PM End time: 02/16/2015 1:01 PM Injection made incrementally with aspirations every 5 mL.  Performed by: Personally  Anesthesiologist: Orrin Brigham  Additional Notes: Functioning IV was confirmed and monitors applied. Ultrasound guidance: relevant anatomy identified, needle position confirmed, local anesthetic spread visualized around nerve(s)., vascular puncture avoided.  Image printed for medical record.  Negative aspiration and no paresthesias; incremental administration of local anesthetic. The patient tolerated the procedure well. Vitals signes recorded in RN notes.   Procedure Name: MAC Performed by: Andee Poles Pre-anesthesia Checklist: Patient identified, Emergency Drugs available, Suction available, Timeout performed and Patient being monitored Patient Re-evaluated:Patient Re-evaluated prior to inductionOxygen Delivery Method: Nasal cannula Placement Confirmation: positive ETCO2

## 2015-02-16 NOTE — Progress Notes (Signed)
Assisted Debabtrata Maji ANMD with right, interscalene  block. Side rails up, monitors on throughout procedure. See vital signs in flow sheet. Tolerated Procedure well.

## 2015-02-16 NOTE — H&P (Signed)
Paper H&P to be scanned into permanent record. H&P reviewed. No changes. 

## 2015-02-16 NOTE — Anesthesia Postprocedure Evaluation (Signed)
  Anesthesia Post-op Note  Patient: Lisa Clay  Procedure(s) Performed: Procedure(s): CLOSED MANIPULATION SHOULDER WITH STEROID INJECTION (Right)  Anesthesia type:Regional  Patient location: PACU  Post pain: Pain level controlled  Post assessment: Post-op Vital signs reviewed, Patient's Cardiovascular Status Stable, Respiratory Function Stable, Patent Airway and No signs of Nausea or vomiting  Post vital signs: Reviewed and stable  Last Vitals:  Filed Vitals:   02/16/15 1330  BP: 99/64  Pulse: 63  Temp:   Resp:     Level of consciousness: awake, alert  and patient cooperative  Complications: No apparent anesthesia complications

## 2015-02-16 NOTE — Transfer of Care (Signed)
Immediate Anesthesia Transfer of Care Note  Patient: Lisa Clay  Procedure(s) Performed: Procedure(s): CLOSED MANIPULATION SHOULDER WITH STEROID INJECTION (Right)  Patient Location: PACU  Anesthesia Type: Regional  Level of Consciousness: awake, alert  and patient cooperative  Airway and Oxygen Therapy: Patient Spontanous Breathing and Patient connected to supplemental oxygen  Post-op Assessment: Post-op Vital signs reviewed, Patient's Cardiovascular Status Stable, Respiratory Function Stable, Patent Airway and No signs of Nausea or vomiting  Post-op Vital Signs: Reviewed and stable  Complications: No apparent anesthesia complications

## 2015-02-16 NOTE — Op Note (Signed)
02/16/2015  1:14 PM  Patient:   Lisa Clay  Pre-Op Diagnosis:   Primary adhesive capsulitis, right shoulder.  Post-Op Diagnosis:   Same  Procedure:   Manipulation under anesthesia with steroid injection, right shoulder.  Surgeon:   Maryagnes Amos, MD  Assistant:   None  Anesthesia:   IV sedation with interscalene block placed preoperatively by anesthesiologist.  Findings:   As above. Prior to manipulation, the right shoulder could be forward flexed to 130 and abducted to 110. At 90 of abduction, the shoulder could be externally rotated to 60 and internally rotated to 35. Following manipulation, the shoulder could be forward flexed to 170, abducted to 165 and, at 90 of abduction, externally rotated to 90 and internally rotated to 60.  Complications:   None  EBL:   0 cc  Fluids:   200 cc crystalloid  TT:   None  Drains:   None  Closure:   None  Brief Clinical Note:   The patient is a 44 year old female with a 6+ month history of painful stiffness of her right shoulder. Despite extensive physical therapy, the patient continues to have difficulty regaining shoulder range of motion. The patient's history and examination are consistent with primary adhesive capsulitis. The patient presents at this time for a manipulation under anesthesia with steroid injection of the right shoulder.  Procedure:   The patient underwent placement of an interscalene block in the preoperative holding area before being brought into the operating room and lain in the supine position. After adequate IV sedation was achieved, a timeout was performed to verify the correct surgical site. The right shoulder was gently manipulated in both abduction and external rotation, as well as adduction and internal rotation. Several palpable and audible pops were heard as the scar tissue released, permitting full range of motion of the shoulder. The glenohumeral joint was injected sterilely using 1 cc  of Kenalog 40 and 9 cc of 0.25% Sensorcaine with epinephrine before the patient was placed into a sling. The patient was then awakened and returned to the recovery room in satisfactory condition after tolerating the procedure well.

## 2015-02-17 ENCOUNTER — Encounter: Payer: Self-pay | Admitting: Surgery

## 2015-02-22 ENCOUNTER — Telehealth: Payer: Self-pay

## 2015-02-22 NOTE — Telephone Encounter (Signed)
LMOM- calcium stone

## 2015-02-22 NOTE — Telephone Encounter (Signed)
-----   Message from Marcine Matar, MD sent at 02/22/2015  1:20 PM EDT ----- Notify pt--calcium stone

## 2015-02-22 NOTE — Telephone Encounter (Signed)
Pt called requesting u/s results. Please advise ?

## 2015-02-22 NOTE — Telephone Encounter (Signed)
Her RUS demonstrated small non obstructing calculi in each kidney. No hydronephrosis on either side. No ureterectasis. Study otherwise unremarkable.

## 2015-02-23 ENCOUNTER — Ambulatory Visit: Payer: Self-pay

## 2015-02-23 NOTE — Telephone Encounter (Signed)
Spoke with pt in reference to RUS results. Pt voiced understanding.  

## 2015-03-17 ENCOUNTER — Ambulatory Visit (INDEPENDENT_AMBULATORY_CARE_PROVIDER_SITE_OTHER): Payer: Medicaid Other | Admitting: Urology

## 2015-03-17 VITALS — BP 108/70 | HR 65 | Ht 66.0 in | Wt 115.6 lb

## 2015-03-17 DIAGNOSIS — N200 Calculus of kidney: Secondary | ICD-10-CM

## 2015-03-17 LAB — URINALYSIS, COMPLETE
BILIRUBIN UA: NEGATIVE
Glucose, UA: NEGATIVE
Ketones, UA: NEGATIVE
LEUKOCYTES UA: NEGATIVE
Nitrite, UA: NEGATIVE
PH UA: 5.5 (ref 5.0–7.5)
Protein, UA: NEGATIVE
RBC UA: NEGATIVE
Specific Gravity, UA: >1.03 — ABNORMAL HIGH (ref 1.005–1.030)
Urobilinogen, Ur: 0.2 mg/dL (ref 0.2–1.0)

## 2015-03-17 LAB — MICROSCOPIC EXAMINATION
RBC, UA: NONE SEEN /hpf (ref 0–?)
RENAL EPITHEL UA: NONE SEEN /HPF

## 2015-03-17 MED ORDER — POTASSIUM CITRATE ER 10 MEQ (1080 MG) PO TBCR: 20.0000 meq | EXTENDED_RELEASE_TABLET | Freq: Three times a day (TID) | ORAL | Status: DC

## 2015-03-17 NOTE — Progress Notes (Signed)
03/17/2015 10:44 AM   Lisa Clay January 23, 1971 [id number]  Referring provider: Dorothey Baseman, MD [address] [facility] [place], [place] [address]  Chief Complaint  Patient presents with  . Nephrolithiasis    6wk follow up    HPI: The patient is a 44 year old female who presents for follow-up of her nephrolithiasis. She passed stone fragments in August 2016. She is been asymptomatic since that time. She has passed no more stone fragments. Her stone analysis as described below came back as calcium based stones. A recent renal ultrasound shows a few small 2-3 mm stones bilaterally. Of note today her urinary pH is 5.5.   PMH: Past Medical History  Diagnosis Date  . Palpitations     [facility] 2/12  . Sinus tachycardia     [facility] 2/12  . Hypertension   . Dysrhythmia     Hx: PSVT. Current: Postural Orthostatic Tachycardia Syndrome, Inappropriate Sinus Node Tachycardia  . Shortness of breath dyspnea     on exertion, secondary to heart issues  . Headache     migraines, rare.  . Syncope     secondary to heart issues  . Kidney stones     Surgical History: Past Surgical History  Procedure Laterality Date  . Cardiac electrophysiology study and ablation  04/24/11    [facility]  . Tonsillectomy    . Closed manipulation shoulder with steriod injection Right 02/16/2015    Procedure: CLOSED MANIPULATION SHOULDER WITH STEROID INJECTION;  Surgeon: Christena Flake, MD;  Location: [facility];  Service: Orthopedics;  Laterality: Right;    Home Medications:    Medication List       This list is accurate as of: 03/17/15 10:44 AM.  Always use your most recent med list.               AMBIEN 5 MG tablet  Generic drug:  zolpidem  Take 5-10 mg by mouth at bedtime as needed.     CORLANOR 5 MG Tabs tablet  Generic drug:  ivabradine  Take 5 mg by mouth 2 (two) times daily.     fludrocortisone 0.1 MG tablet  Commonly  known as:  FLORINEF  Take 100 mcg by mouth daily.     MIRENA (52 MG) 20 MCG/24HR IUD  Generic drug:  levonorgestrel  1 each by Intrauterine route once.     potassium citrate 10 MEQ (1080 MG) SR tablet  Commonly known as:  UROCIT-K  Take 2 tablets (20 mEq total) by mouth 3 (three) times daily with meals.     propranolol 20 MG tablet  Commonly known as:  INDERAL  TAKE 1 TABLET (20 MG TOTAL) BY MOUTH 4 TIMES DAILY.     topiramate 25 MG tablet  Commonly known as:  TOPAMAX  TAKE 1 TABLET (25 MG TOTAL) BY MOUTH 3 (THREE) TIMES A DAY.        Allergies:  Allergies  Allergen Reactions  . Erythromycin Nausea And Vomiting    Family History: Family History  Problem Relation Age of Onset  . Heart attack Maternal Grandmother 50    deceased  . Hypertension Father     and high cholesterol   . Hypertension Mother     and high cholesterol     Social History:  reports that she has never smoked. She does not have any smokeless tobacco history on file. She reports that she does not drink alcohol or use illicit drugs.  ROS: UROLOGY Frequent Urination?: No Hard to postpone urination?: No Burning/pain with urination?: No Get up at night to urinate?: No Leakage of urine?: Yes Urine stream starts and stops?: No Trouble starting stream?: No Do you have to strain to urinate?: No Blood in urine?: No Urinary tract infection?: No Sexually transmitted disease?: No Injury to kidneys or bladder?: No Painful intercourse?: No Weak stream?: No Currently pregnant?: No Vaginal bleeding?: No Last menstrual period?: n  Gastrointestinal Nausea?: No Vomiting?: No Indigestion/heartburn?: No Diarrhea?: No Constipation?: No  Constitutional Fever: No Night sweats?: No Weight loss?: No Fatigue?: No  Skin Skin rash/lesions?: No Itching?: No  Eyes Blurred vision?: No Double vision?: No  Ears/Nose/Throat Sore throat?: No Sinus problems?: No  Hematologic/Lymphatic Swollen glands?:  No Easy bruising?: No  Cardiovascular Leg swelling?: No Chest pain?: No  Respiratory Cough?: No Shortness of breath?: No  Endocrine Excessive thirst?: No  Musculoskeletal Back pain?: No Joint pain?: No  Neurological Headaches?: No Dizziness?: No  Psychologic Depression?: No Anxiety?: No  Physical Exam: BP 108/70 mmHg  Pulse 65  Ht  (1.676 m)  Wt 115 lb 9.6 oz (52.436 kg)  BMI 18.67 kg/m2  LMP 03/16/2015  Constitutional:  Alert and oriented, No acute distress. HEENT: [place] AT, moist mucus membranes.  Trachea midline, no masses. Cardiovascular: No clubbing, cyanosis, or edema. Respiratory: Normal respiratory effort, no increased work of breathing. GI: Abdomen is soft, nontender, nondistended, no abdominal masses GU: No CVA tenderness.  Skin: No rashes, bruises or suspicious lesions. Lymph: No cervical or inguinal adenopathy. Neurologic: Grossly intact, no focal deficits, moving all 4 extremities. Psychiatric: Normal mood and affect.  Laboratory Data: No results found for: WBC, HGB, HCT, MCV, PLT  Lab Results  Component Value Date   CREATININE 0.79 09/08/2011    No results found for: PSA  No results found for: TESTOSTERONE  No results found for: HGBA1C  Urinalysis    Component Value Date/Time   GLUCOSEU Negative 02/03/2015 1001   BILIRUBINUR Positive* 02/03/2015 1001   NITRITE Negative 02/03/2015 1001   LEUKOCYTESUR Trace* 02/03/2015 1001    Pertinent Imaging: CLINICAL DATA: Renal calculi ; suprapubic pain with urination  EXAM: RENAL / URINARY TRACT ULTRASOUND COMPLETE  COMPARISON: None.  FINDINGS: Right Kidney:  Length: 11.5 cm. Echogenicity and renal cortical thickness are within normal limits. No mass, perinephric fluid, or hydronephrosis visualized. There are several 2-3 mm echogenic foci in the right kidney, felt to represent small calculi. No ureterectasis.  Left Kidney:  Length: 11.6 cm. Echogenicity and renal cortical  thickness are within normal limits. No mass, perinephric fluid, or hydronephrosis visualized. There are several 2-3 mm echogenic foci in the left kidney, felt to represent small calculi. No ureterectasis.  Bladder:  Appears normal for degree of bladder distention.  IMPRESSION: Small nonobstructing calculi in each kidney. No hydronephrosis on either side. No ureterectasis. Study otherwise unremarkable.   Stone analysis shows calcium oxalate monohydrate 40%, calcium oxalate dihydrate 15%, and calcium phosphate carbonate 45%  24-hour urine shows a low urine volume of 1.07 and hypocitraturia the citrate level of 14 g a day (greater than 550 is normal in females) Assessment & Plan:    I discussed with the patient results were 24-hour urinalysis. Her low urine pH, hypocitraturia, and low urine volume all put her at risk for stone formation. She was advised to drink more fluid to increase her urine vomited 2-1/2 L. She will also be started on potassium citrate 20 mEq 3 times a day. She  was advised to this medication with meals. She will follow-up in one month's time to check her urinalysis in this metabolic panel, in particular potassium.  1. Nephrolithiasis -K citrate 20 meq tid -check U/A, BMP in one month    Return in about 4 weeks (around 04/14/2015) for BMP, U/A.  Hildred Laser, MD  [facility] [address] [place], [place] [address] [phone]

## 2015-03-18 ENCOUNTER — Other Ambulatory Visit: Payer: Self-pay | Admitting: Urology

## 2015-03-24 ENCOUNTER — Other Ambulatory Visit: Payer: Self-pay

## 2015-03-24 DIAGNOSIS — N200 Calculus of kidney: Secondary | ICD-10-CM

## 2015-03-24 MED ORDER — POTASSIUM CITRATE ER 10 MEQ (1080 MG) PO TBCR: 20.0000 meq | EXTENDED_RELEASE_TABLET | Freq: Three times a day (TID) | ORAL | Status: DC

## 2015-04-06 ENCOUNTER — Other Ambulatory Visit: Payer: Self-pay | Admitting: Neurology

## 2015-04-06 ENCOUNTER — Other Ambulatory Visit: Payer: Self-pay | Admitting: Obstetrics and Gynecology

## 2015-04-06 DIAGNOSIS — N3946 Mixed incontinence: Secondary | ICD-10-CM | POA: Insufficient documentation

## 2015-04-06 DIAGNOSIS — R921 Mammographic calcification found on diagnostic imaging of breast: Secondary | ICD-10-CM

## 2015-04-06 DIAGNOSIS — R4189 Other symptoms and signs involving cognitive functions and awareness: Secondary | ICD-10-CM

## 2015-04-06 DIAGNOSIS — Z01419 Encounter for gynecological examination (general) (routine) without abnormal findings: Secondary | ICD-10-CM | POA: Insufficient documentation

## 2015-04-14 ENCOUNTER — Encounter: Payer: Self-pay | Admitting: Urology

## 2015-04-14 ENCOUNTER — Ambulatory Visit (INDEPENDENT_AMBULATORY_CARE_PROVIDER_SITE_OTHER): Payer: Medicaid Other | Admitting: Urology

## 2015-04-14 VITALS — BP 104/66 | HR 61 | Ht 66.0 in | Wt 121.0 lb

## 2015-04-14 DIAGNOSIS — N200 Calculus of kidney: Secondary | ICD-10-CM | POA: Diagnosis not present

## 2015-04-14 LAB — MICROSCOPIC EXAMINATION
RBC MICROSCOPIC, UA: NONE SEEN /HPF (ref 0–?)
Renal Epithel, UA: NONE SEEN /hpf

## 2015-04-14 LAB — URINALYSIS, COMPLETE
BILIRUBIN UA: NEGATIVE
Glucose, UA: NEGATIVE
KETONES UA: NEGATIVE
NITRITE UA: NEGATIVE
Protein, UA: NEGATIVE
RBC UA: NEGATIVE
SPEC GRAV UA: 1.015 (ref 1.005–1.030)
UUROB: 1 mg/dL (ref 0.2–1.0)
pH, UA: 8.5 — ABNORMAL HIGH (ref 5.0–7.5)

## 2015-04-14 MED ORDER — POTASSIUM CITRATE ER 10 MEQ (1080 MG) PO TBCR: 20.0000 meq | EXTENDED_RELEASE_TABLET | Freq: Two times a day (BID) | ORAL | Status: DC

## 2015-04-14 NOTE — Progress Notes (Signed)
04/14/2015 9:46 AM   Ashanti Efraim Kaufmann Rosales 1970-10-23 [id number]  Referring provider: Dorothey Baseman, MD [address] [facility] [place], [place] [address]  Chief Complaint  Patient presents with  . Nephrolithiasis    79month    HPI: The patient is a 44 year old female who presents for follow-up of her nephrolithiasis. She passed stone fragments in August 2016. She is been asymptomatic since that time. She has passed no more stone fragments. Her stone analysis as described below came back as calcium based stones. A recent renal ultrasound shows a few small 2-3 mm stones bilaterally. Of note today her urinary pH is 5.5. Her 24-hour urinalysis showed low volume, hypocitraturia, and low urinary pH.  Interval history: The patient returns today after being started on potassium citrate. She has been taking her potassium citrate 20 mEq 3 times a day. Her urinalysis today shows a pH of 8.5. She recently had labs drawn at [facility] which showed normal potassium and bicarbonate.   PMH: Past Medical History  Diagnosis Date  . Palpitations     [facility] 2/12  . Sinus tachycardia (HCC)     [facility] 2/12  . Hypertension   . Dysrhythmia     Hx: PSVT. Current: Postural Orthostatic Tachycardia Syndrome, Inappropriate Sinus Node Tachycardia  . Shortness of breath dyspnea     on exertion, secondary to heart issues  . Headache     migraines, rare.  . Syncope     secondary to heart issues  . Kidney stones     Surgical History: Past Surgical History  Procedure Laterality Date  . Cardiac electrophysiology study and ablation  04/24/11    [facility]  . Tonsillectomy    . Closed manipulation shoulder with steriod injection Right 02/16/2015    Procedure: CLOSED MANIPULATION SHOULDER WITH STEROID INJECTION;  Surgeon: Christena Flake, MD;  Location: [facility];  Service: Orthopedics;  Laterality: Right;    Home Medications:    Medication List       This list is accurate as of: 04/14/15  9:46 AM.  Always use your most recent med list.               AMBIEN 5 MG tablet  Generic drug:  zolpidem  Take 5-10 mg by mouth at bedtime as needed.     CALCIUM + D PO  Take by mouth.     CORLANOR 5 MG Tabs tablet  Generic drug:  ivabradine  Take 5 mg by mouth 2 (two) times daily.     fludrocortisone 0.1 MG tablet  Commonly known as:  FLORINEF  Take 100 mcg by mouth daily.     MIRENA (52 MG) 20 MCG/24HR IUD  Generic drug:  levonorgestrel  1 each by Intrauterine route once.     multivitamin capsule  Take 1 capsule by mouth daily.     potassium citrate 10 MEQ (1080 MG) SR tablet  Commonly known as:  UROCIT-K  Take 2 tablets (20 mEq total) by mouth 3 (three) times daily with meals.     propranolol 20 MG tablet  Commonly known as:  INDERAL  TAKE 1 TABLET (20 MG TOTAL) BY MOUTH 4 TIMES DAILY.     topiramate 25 MG tablet  Commonly known as:  TOPAMAX  TAKE 1 TABLET (25 MG TOTAL) BY MOUTH 3 (THREE) TIMES A DAY.     vitamin B-12 1000 MCG tablet  Commonly known as:  CYANOCOBALAMIN  Take 1,000 mcg by  mouth daily.        Allergies:  Allergies  Allergen Reactions  . Erythromycin Nausea And Vomiting    Family History: Family History  Problem Relation Age of Onset  . Heart attack Maternal Grandmother 50    deceased  . Hypertension Father     and high cholesterol   . Hypertension Mother     and high cholesterol     Social History:  reports that she has never smoked. She does not have any smokeless tobacco history on file. She reports that she does not drink alcohol or use illicit drugs.  ROS: UROLOGY Frequent Urination?: No Hard to postpone urination?: No Burning/pain with urination?: No Get up at night to urinate?: No Leakage of urine?: No Urine stream starts and stops?: No Trouble starting stream?: No Do you have to strain to urinate?: No Blood in urine?: No Urinary tract infection?: No Sexually transmitted  disease?: No Injury to kidneys or bladder?: No Painful intercourse?: No Weak stream?: No Currently pregnant?: No Vaginal bleeding?: No Last menstrual period?: n  Gastrointestinal Nausea?: No Vomiting?: No Indigestion/heartburn?: No Diarrhea?: No Constipation?: No  Constitutional Fever: No Night sweats?: No Weight loss?: No Fatigue?: No  Skin Skin rash/lesions?: No Itching?: No  Eyes Blurred vision?: No Double vision?: No  Ears/Nose/Throat Sore throat?: No Sinus problems?: No  Hematologic/Lymphatic Swollen glands?: No Easy bruising?: No  Cardiovascular Leg swelling?: No Chest pain?: No  Respiratory Cough?: No Shortness of breath?: No  Endocrine Excessive thirst?: No  Musculoskeletal Back pain?: No Joint pain?: No  Neurological Headaches?: Yes Dizziness?: No  Psychologic Depression?: No Anxiety?: No  Physical Exam: BP 104/66 mmHg  Pulse 61  Ht 5\' 6"  (1.676 m)  Wt 121 lb (54.885 kg)  BMI 19.54 kg/m2  LMP 03/16/2015  Constitutional:  Alert and oriented, No acute distress. HEENT: [place] AT, moist mucus membranes.  Trachea midline, no masses. Cardiovascular: No clubbing, cyanosis, or edema. Respiratory: Normal respiratory effort, no increased work of breathing. GI: Abdomen is soft, nontender, nondistended, no abdominal masses GU: No CVA tenderness.  Skin: No rashes, bruises or suspicious lesions. Lymph: No cervical or inguinal adenopathy. Neurologic: Grossly intact, no focal deficits, moving all 4 extremities. Psychiatric: Normal mood and affect.  Laboratory Data: No results found for: WBC, HGB, HCT, MCV, PLT  Lab Results  Component Value Date   CREATININE 0.79 09/08/2011    No results found for: PSA  No results found for: TESTOSTERONE  No results found for: HGBA1C  Urinalysis    Component Value Date/Time   GLUCOSEU Negative 03/17/2015 0957   BILIRUBINUR Negative 03/17/2015 0957   NITRITE Negative 03/17/2015 0957   LEUKOCYTESUR  Negative 03/17/2015 0957     Assessment & Plan:   1. Nephrolithiasis  The patient was instructed to decrease her potassium citrate to 20 mEq twice a day from 3 times a day as her urinary pH today is 8.5. Her potassium is normal at this time. She'll follow-up in 4 months for CBC, BMP, and urinalysis.    Return in about 4 months (around 08/12/2015).  Hildred LaserBrian James Damonie Ellenwood, MD  [facility] [address], [address] [place], [place] [id number] [phone]

## 2015-04-15 ENCOUNTER — Ambulatory Visit: Payer: Medicaid Other

## 2015-04-15 LAB — BASIC METABOLIC PANEL
BUN/Creatinine Ratio: 14 (ref 9–23)
BUN: 11 mg/dL (ref 6–24)
CO2: 23 mmol/L (ref 18–29)
CREATININE: 0.78 mg/dL (ref 0.57–1.00)
Calcium: 9.6 mg/dL (ref 8.7–10.2)
Chloride: 102 mmol/L (ref 97–106)
GFR calc Af Amer: 107 mL/min/{1.73_m2} (ref >59)
GFR, EST NON AFRICAN AMERICAN: 93 mL/min/{1.73_m2} (ref >59)
GLUCOSE: 97 mg/dL (ref 65–99)
Potassium: 5.5 mmol/L — ABNORMAL HIGH (ref 3.5–5.2)
SODIUM: 138 mmol/L (ref 136–144)

## 2015-04-20 ENCOUNTER — Ambulatory Visit
Admission: RE | Admit: 2015-04-20 | Discharge: 2015-04-20 | Disposition: A | Discharge status: Home / Self Care | Payer: Medicaid Other | Source: Ambulatory Visit | Attending: Neurology | Admitting: Neurology

## 2015-04-20 DIAGNOSIS — G43119 Migraine with aura, intractable, without status migrainosus: Secondary | ICD-10-CM | POA: Diagnosis present

## 2015-04-20 DIAGNOSIS — R4189 Other symptoms and signs involving cognitive functions and awareness: Secondary | ICD-10-CM

## 2015-04-20 MED ORDER — GADOBENATE DIMEGLUMINE 529 MG/ML IV SOLN
10.0000 mL | Freq: Once | INTRAVENOUS | Status: AC | PRN
  Administered 2015-04-20: 10 mL via INTRAVENOUS

## 2015-04-22 ENCOUNTER — Other Ambulatory Visit: Payer: Medicaid Other

## 2015-04-22 ENCOUNTER — Ambulatory Visit: Payer: Medicaid Other

## 2015-04-25 ENCOUNTER — Ambulatory Visit: Payer: Medicaid Other | Attending: Obstetrics and Gynecology

## 2015-04-25 ENCOUNTER — Other Ambulatory Visit: Payer: Medicaid Other

## 2015-04-25 ENCOUNTER — Ambulatory Visit: Payer: Medicaid Other

## 2015-05-24 ENCOUNTER — Ambulatory Visit
Admission: RE | Admit: 2015-05-24 | Discharge: 2015-05-24 | Disposition: A | Discharge status: Home / Self Care | Payer: Medicaid Other | Source: Ambulatory Visit | Attending: Obstetrics and Gynecology | Admitting: Obstetrics and Gynecology

## 2015-05-24 ENCOUNTER — Other Ambulatory Visit: Payer: Self-pay | Admitting: Obstetrics and Gynecology

## 2015-05-24 DIAGNOSIS — R921 Mammographic calcification found on diagnostic imaging of breast: Secondary | ICD-10-CM | POA: Insufficient documentation

## 2015-08-17 ENCOUNTER — Ambulatory Visit: Payer: Medicaid Other

## 2015-08-18 ENCOUNTER — Ambulatory Visit: Payer: Medicaid Other

## 2015-08-26 ENCOUNTER — Ambulatory Visit: Payer: Medicaid Other

## 2016-05-25 ENCOUNTER — Other Ambulatory Visit: Payer: Self-pay | Admitting: Family Medicine

## 2016-09-08 ENCOUNTER — Encounter: Payer: Self-pay | Admitting: Emergency Medicine

## 2016-09-08 DIAGNOSIS — Z79899 Other long term (current) drug therapy: Secondary | ICD-10-CM | POA: Diagnosis not present

## 2016-09-08 DIAGNOSIS — R600 Localized edema: Secondary | ICD-10-CM | POA: Insufficient documentation

## 2016-09-08 DIAGNOSIS — M7989 Other specified soft tissue disorders: Secondary | ICD-10-CM | POA: Diagnosis present

## 2016-09-08 DIAGNOSIS — R0602 Shortness of breath: Secondary | ICD-10-CM | POA: Diagnosis not present

## 2016-09-08 DIAGNOSIS — N390 Urinary tract infection, site not specified: Secondary | ICD-10-CM | POA: Insufficient documentation

## 2016-09-08 LAB — CBC WITH DIFFERENTIAL/PLATELET
Basophils Absolute: 0.1 10*3/uL (ref 0–0.1)
Basophils Relative: 1 %
EOS PCT: 3 %
Eosinophils Absolute: 0.1 10*3/uL (ref 0–0.7)
HEMATOCRIT: 37.1 % (ref 35.0–47.0)
Hemoglobin: 12.8 g/dL (ref 12.0–16.0)
LYMPHS ABS: 1.4 10*3/uL (ref 1.0–3.6)
LYMPHS PCT: 27 %
MCH: 29.2 pg (ref 26.0–34.0)
MCHC: 34.5 g/dL (ref 32.0–36.0)
MCV: 84.6 fL (ref 80.0–100.0)
MONO ABS: 0.6 10*3/uL (ref 0.2–0.9)
Monocytes Relative: 11 %
NEUTROS ABS: 3.2 10*3/uL (ref 1.4–6.5)
Neutrophils Relative %: 58 %
Platelets: 298 10*3/uL (ref 150–440)
RBC: 4.38 MIL/uL (ref 3.80–5.20)
RDW: 12.9 % (ref 11.5–14.5)
WBC: 5.3 10*3/uL (ref 3.6–11.0)

## 2016-09-08 LAB — BASIC METABOLIC PANEL
ANION GAP: 7 (ref 5–15)
BUN: 23 mg/dL — AB (ref 6–20)
CO2: 24 mmol/L (ref 22–32)
Calcium: 9.4 mg/dL (ref 8.9–10.3)
Chloride: 106 mmol/L (ref 101–111)
Creatinine, Ser: 0.73 mg/dL (ref 0.44–1.00)
GFR calc Af Amer: >60 mL/min (ref >60)
GFR calc non Af Amer: >60 mL/min (ref >60)
GLUCOSE: 99 mg/dL (ref 65–99)
POTASSIUM: 3.3 mmol/L — AB (ref 3.5–5.1)
Sodium: 137 mmol/L (ref 135–145)

## 2016-09-08 LAB — BRAIN NATRIURETIC PEPTIDE: B NATRIURETIC PEPTIDE 5: 9 pg/mL (ref 0.0–100.0)

## 2016-09-08 NOTE — ED Notes (Signed)
Per Don Perking, only lab work at this time.

## 2016-09-08 NOTE — ED Triage Notes (Signed)
Pt presents to ED with c/o bilateral leg swelling and left flank pain. Pulses are present but weak and pt reports tingling and numbness to legs. Pt has hx of kidney stones. Legs are swollen bilaterally at this time. Pt with hx of cardiac symptoms. Pt is in NAD at this time.

## 2016-09-09 ENCOUNTER — Emergency Department
Admission: EM | Admit: 2016-09-09 | Discharge: 2016-09-09 | Disposition: A | Discharge status: Home / Self Care | Payer: Medicaid Other | Attending: Emergency Medicine | Admitting: Emergency Medicine

## 2016-09-09 DIAGNOSIS — R600 Localized edema: Secondary | ICD-10-CM

## 2016-09-09 DIAGNOSIS — N390 Urinary tract infection, site not specified: Secondary | ICD-10-CM

## 2016-09-09 HISTORY — DX: Other supraventricular tachycardia: I47.19

## 2016-09-09 HISTORY — DX: Supraventricular tachycardia: I47.1

## 2016-09-09 LAB — URINALYSIS, COMPLETE (UACMP) WITH MICROSCOPIC
Bacteria, UA: NONE SEEN
Bilirubin Urine: NEGATIVE
Glucose, UA: NEGATIVE mg/dL
Ketones, ur: 5 mg/dL — AB
Nitrite: NEGATIVE
PH: 6 (ref 5.0–8.0)
Protein, ur: NEGATIVE mg/dL
SPECIFIC GRAVITY, URINE: 1.017 (ref 1.005–1.030)

## 2016-09-09 MED ORDER — CEPHALEXIN 500 MG PO CAPS
500.0000 mg | ORAL_CAPSULE | Freq: Once | ORAL | Status: AC
  Administered 2016-09-09: 500 mg via ORAL
  Filled 2016-09-09: qty 1

## 2016-09-09 MED ORDER — CEPHALEXIN 500 MG PO CAPS: 500.0000 mg | ORAL_CAPSULE | Freq: Three times a day (TID) | ORAL | 0 refills | Status: DC

## 2016-09-09 NOTE — Discharge Instructions (Signed)
Please seek medical attention for any high fevers, chest pain, shortness of breath, change in behavior, persistent vomiting, bloody stool or any other new or concerning symptoms.  

## 2016-09-09 NOTE — ED Provider Notes (Signed)
[facility] Emergency Department Provider Note  ____________________________________________   I have reviewed the triage vital signs and the nursing notes.   HISTORY  Chief Complaint Leg Swelling and Flank Pain   History limited by: Not Limited   HPI Lisa Clay is a 46 y.o. female who presents to the emergency department today because of concerns for bilateral leg swelling. The patient states that this is been going on for the past few days. At the time my examination the swelling is actually better. Swelling had been in both legs. Had been accompanied by paresthesias. The patient also states that the skin changed color however is back to normal this point. Patient denies any recent travel or prolonged immobilization. No history of blood clots. Patient is not on any estrogen containing medications. In addition the patient also has recently noted some flank pain. No fevers.   Past Medical History:  Diagnosis Date  . Atrial tachycardia (HCC)   . Dysrhythmia    Hx: PSVT. Current: Postural Orthostatic Tachycardia Syndrome, Inappropriate Sinus Node Tachycardia  . Headache    migraines, rare.  . Kidney stones   . Palpitations    [facility] 2/12  . Shortness of breath dyspnea    on exertion, secondary to heart issues  . Sinus tachycardia    [facility] 2/12  . Syncope    secondary to heart issues    Patient Active Problem List   Diagnosis Date Noted  . Encounter for gynecological examination without abnormal finding 04/06/2015  . Mixed incontinence 04/06/2015  . 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 Bursitis of shoulder 12/29/2014  . Tendinitis of right shoulder 12/29/2014  . Other shoulder lesions, right shoulder 12/29/2014  . Episode of syncope 05/11/2014  . Atrial flutter (HCC) 09/05/2012  . CFIDS (chronic fatigue and immune dysfunction syndrome) (HCC) 09/05/2012  . 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 Postural orthostatic tachycardia  syndrome 08/30/2011  . Fast heart beat 08/30/2011  . Supraventricular arrhythmia 05/28/2011  . UNSPECIFIED MYOCARDITIS 07/20/2010  . SINUS TACHYCARDIA 07/18/2010  . DYSAUTONOMIA 07/18/2010  . PALPITATIONS, RECURRENT 07/18/2010  . DYSPNEA ON EXERTION 07/18/2010  . 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 BREAST BIOPSY Left 05/11/2013   stereo biopsy  . CARDIAC ELECTROPHYSIOLOGY STUDY AND ABLATION  04/24/11   [facility]  . CLOSED MANIPULATION SHOULDER WITH STERIOD INJECTION Right 02/16/2015   Procedure: CLOSED MANIPULATION SHOULDER WITH STEROID INJECTION;  Surgeon: Christena Flake, MD;  Location: [facility];  Service: Orthopedics;  Laterality: Right;  . TONSILLECTOMY      Prior to Admission medications   Medication Sig Start Date End Date Taking? Authorizing Provider  Calcium Citrate-Vitamin D (CALCIUM + D PO) Take by mouth.    Historical Provider, MD  CORLANOR 5 MG TABS tablet Take 5 mg by mouth 2 (two) times daily. 01/07/15   Historical Provider, MD  fludrocortisone (FLORINEF) 0.1 MG tablet Take 100 mcg by mouth daily. 01/28/15   Historical Provider, MD  levonorgestrel (MIRENA) 20 MCG/24HR IUD 1 each by Intrauterine route once.      Historical Provider, MD  Multiple Vitamin (MULTIVITAMIN) capsule Take 1 capsule by mouth daily.    Historical Provider, MD  potassium citrate (UROCIT-K) 10 MEQ (1080 MG) SR tablet Take 2 tablets (20 mEq total) by mouth 2 (two) times daily with a meal. 04/14/15   Hildred Laser, MD  propranolol (INDERAL) 20 MG tablet TAKE 1 TABLET (20 MG TOTAL) BY MOUTH  4 TIMES DAILY. 12/03/14   Historical Provider, MD  topiramate (TOPAMAX) 25 MG tablet TAKE 1 TABLET (25 MG TOTAL) BY MOUTH 3 (THREE) TIMES A DAY. 12/03/14   Historical Provider, MD  vitamin B-12 (CYANOCOBALAMIN) 1000 MCG tablet Take 1,000 mcg by mouth daily.    Historical Provider, MD  zolpidem (AMBIEN) 5 MG tablet Take 5-10 mg by mouth at  bedtime as needed.      Historical Provider, MD    Allergies Erythromycin  Family History  Problem Relation Age of Onset  . Heart attack Maternal Grandmother 50    deceased  . Hypertension Father     and high cholesterol   . Hypertension Mother     and high cholesterol     Social History Social History  Substance Use Topics  . Smoking status: Never Smoker  . Smokeless tobacco: Never Used     Comment: tobacco use - no   . Alcohol use No     Comment: rare    Review of Systems  Constitutional: Negative for fever. Cardiovascular: Negative for chest pain. Respiratory: Negative for shortness of breath. Gastrointestinal: Positive for flank pain. Genitourinary: Negative for dysuria. Musculoskeletal: Positive for bilateral leg swelling. Skin: Negative for rash. Neurological: Negative for headaches, focal weakness or numbness.  10-point ROS otherwise negative.  ____________________________________________   PHYSICAL EXAM:  VITAL SIGNS: ED Triage Vitals  Enc Vitals Group     BP 09/08/16 2012 135/87     Pulse Rate 09/08/16 2012 71     Resp 09/08/16 2012 (!) 24     Temp 09/08/16 2012 97.9 F (36.6 C)     Temp Source 09/08/16 2012 Oral     SpO2 09/08/16 2012 100 %     Weight 09/08/16 2016 115 lb 4.8 oz (52.3 kg)     Height 09/08/16 2016  (1.676 m)     Head Circumference --      Peak Flow --      Pain Score 09/08/16 2011 3    Constitutional: Alert and oriented. Well appearing and in no distress. Eyes: Conjunctivae are normal. Normal extraocular movements. ENT   Head: Normocephalic and atraumatic.   Nose: No congestion/rhinnorhea.   Mouth/Throat: Mucous membranes are moist.   Neck: No stridor. Hematological/Lymphatic/Immunilogical: No cervical lymphadenopathy. Cardiovascular: Normal rate, regular rhythm.  No murmurs, rubs, or gallops. Respiratory: Normal respiratory effort without tachypnea nor retractions. Breath sounds are clear and equal  bilaterally. No wheezes/rales/rhonchi. Gastrointestinal: Soft and non tender. No rebound. No guarding.  Genitourinary: Deferred Musculoskeletal: Normal range of motion in all extremities. No lower extremity edema. DP 2+ bilaterally. Skin within normal limits.  Neurologic:  Normal speech and language. No gross focal neurologic deficits are appreciated.  Skin:  Skin is warm, dry and intact. No rash noted. Psychiatric: Mood and affect are normal. Speech and behavior are normal. Patient exhibits appropriate insight and judgment.  ____________________________________________    LABS (pertinent positives/negatives)  Labs Reviewed  BASIC METABOLIC PANEL - Abnormal; Notable for the following:       Result Value   Potassium 3.3 (*)    BUN 23 (*)    All other components within normal limits  URINALYSIS, COMPLETE (UACMP) WITH MICROSCOPIC - Abnormal; Notable for the following:    Color, Urine YELLOW (*)    APPearance HAZY (*)    Hgb urine dipstick SMALL (*)    Ketones, ur 5 (*)    Leukocytes, UA TRACE (*)    Squamous Epithelial / LPF 0-5 (*)  All other components within normal limits  CBC WITH DIFFERENTIAL/PLATELET  BRAIN NATRIURETIC PEPTIDE     ____________________________________________   EKG  None  ____________________________________________    RADIOLOGY  None  ____________________________________________   PROCEDURES  Procedures  ____________________________________________   INITIAL IMPRESSION / ASSESSMENT AND PLAN / ED COURSE  Pertinent labs & imaging results that were available during my care of the patient were reviewed by me and considered in my medical decision making (see chart for details).  Patient presented to the emergency department today because of concerns for bilateral leg swelling. On exam her legs are within normal limits. Patient does state that they're better than what they were earlier. BNP was within normal limits. Patient does have a  history of some cardiac issues. Patient without any risk factors for blood clots. At this point unclear etiology however venous insufficiency think is likely. Additionally patient was complaining of some flank pain. Urine does have white blood cells. Will give patient prescription for antibiotics. Patient has follow-up appointment scheduled early next week with her primary care.  ____________________________________________   FINAL CLINICAL IMPRESSION(S) / ED DIAGNOSES  Final diagnoses:  Leg edema  Lower urinary tract infectious disease     Note: This dictation was prepared with Dragon dictation. Any transcriptional errors that result from this process are unintentional     Phineas Semen, MD 09/09/16 [phone]

## 2016-09-09 NOTE — ED Notes (Signed)
ED Provider at bedside. 

## 2016-10-12 ENCOUNTER — Emergency Department
Admission: EM | Admit: 2016-10-12 | Discharge: 2016-10-12 | Disposition: A | Discharge status: Home / Self Care | Payer: Medicaid Other | Attending: Emergency Medicine | Admitting: Emergency Medicine

## 2016-10-12 DIAGNOSIS — R55 Syncope and collapse: Secondary | ICD-10-CM | POA: Diagnosis present

## 2016-10-12 DIAGNOSIS — Z79899 Other long term (current) drug therapy: Secondary | ICD-10-CM | POA: Insufficient documentation

## 2016-10-12 LAB — CBC
HCT: 39.2 % (ref 35.0–47.0)
HEMOGLOBIN: 13 g/dL (ref 12.0–16.0)
MCH: 28.9 pg (ref 26.0–34.0)
MCHC: 33 g/dL (ref 32.0–36.0)
MCV: 87.4 fL (ref 80.0–100.0)
PLATELETS: 299 10*3/uL (ref 150–440)
RBC: 4.49 MIL/uL (ref 3.80–5.20)
RDW: 13 % (ref 11.5–14.5)
WBC: 7.3 10*3/uL (ref 3.6–11.0)

## 2016-10-12 LAB — BASIC METABOLIC PANEL
ANION GAP: 6 (ref 5–15)
BUN: 15 mg/dL (ref 6–20)
CALCIUM: 8.7 mg/dL — AB (ref 8.9–10.3)
CO2: 22 mmol/L (ref 22–32)
CREATININE: 0.7 mg/dL (ref 0.44–1.00)
Chloride: 108 mmol/L (ref 101–111)
GFR calc non Af Amer: >60 mL/min (ref >60)
Glucose, Bld: 111 mg/dL — ABNORMAL HIGH (ref 65–99)
Potassium: 3.4 mmol/L — ABNORMAL LOW (ref 3.5–5.1)
SODIUM: 136 mmol/L (ref 135–145)

## 2016-10-12 LAB — MAGNESIUM: MAGNESIUM: 2.1 mg/dL (ref 1.7–2.4)

## 2016-10-12 MED ORDER — POTASSIUM CHLORIDE CRYS ER 20 MEQ PO TBCR
40.0000 meq | EXTENDED_RELEASE_TABLET | Freq: Once | ORAL | Status: AC
  Administered 2016-10-12: 40 meq via ORAL
  Filled 2016-10-12: qty 2

## 2016-10-12 MED ORDER — SODIUM CHLORIDE 0.9 % IV BOLUS (SEPSIS)
1000.0000 mL | Freq: Once | INTRAVENOUS | Status: AC
  Administered 2016-10-12: 1000 mL via INTRAVENOUS

## 2016-10-12 NOTE — ED Notes (Signed)
Patient ambulated in the room and to the room bathroom with a steady gait. NAD.

## 2016-10-12 NOTE — ED Triage Notes (Signed)
Pt here after having a near syncopal episode while seated in her car, pt states that she started seeing bright lights and felt nauseated and as if she were going to lose her continence, pt denies pain

## 2016-10-12 NOTE — ED Provider Notes (Signed)
[facility] Emergency Department Provider Note   ____________________________________________   First MD Initiated Contact with Patient 10/12/16 1747     (approximate)  I have reviewed the triage vital signs and the nursing notes.   HISTORY  Chief Complaint Near Syncope   HPI Lisa Clay is a 46 y.o. female here for evaluation of nearly passing out  Patient reports that she was out looking at tomato plants in the store, she has chronic tiredness and fatigue, but she reports that she started experiencing a feeling of worsening weakness and lightheadedness while standing. She went to her car and then had to lay the seat down inserted feeling sweaty, nauseated and felt like "a white blanket" was falling over her eyes. She reports she felt as though she was going to but did not pass out. She did not express any chest pain or trouble breathing. She reports she felt anxious with this. She has not had a history of any heart disease like a stent, but reports she has had many evaluations for arrhythmias. She denies sprains any palpitations or racing heart today.  She does report that she continues her current heart medicines including beta blocker, and she also takes Florinef and nadolol for a known history of tachycardia for which she was not able to have an ablation as it was centered upon her AV node.  [facility]  She has continued to take her Florinef regularly, has not missed any doses. She denies adrenal insufficiency, but reports she was placed on this to help maintain her blood pressures that she has low blood pressures on a daily basis.   Past Medical History:  Diagnosis Date  . Atrial tachycardia (HCC)   . Dysrhythmia    Hx: PSVT. Current: Postural Orthostatic Tachycardia Syndrome, Inappropriate Sinus Node Tachycardia  . Headache    migraines, rare.  . Kidney stones   .  Palpitations    [facility] 2/12  . Shortness of breath dyspnea    on exertion, secondary to heart issues  . Sinus tachycardia    [facility] 2/12  . Syncope    secondary to heart issues    Patient Active Problem List   Diagnosis Date Noted  . Encounter for gynecological examination without abnormal finding 04/06/2015  . Mixed incontinence 04/06/2015  . 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 Bursitis of shoulder 12/29/2014  . Tendinitis of right shoulder 12/29/2014  . Other shoulder lesions, right shoulder 12/29/2014  . Episode of syncope 05/11/2014  . Atrial flutter (HCC) 09/05/2012  . CFIDS (chronic fatigue and immune dysfunction syndrome) (HCC) 09/05/2012  . 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 Postural orthostatic tachycardia syndrome 08/30/2011  . Fast heart beat 08/30/2011  . Supraventricular arrhythmia 05/28/2011  . UNSPECIFIED MYOCARDITIS 07/20/2010  . SINUS TACHYCARDIA 07/18/2010  . DYSAUTONOMIA 07/18/2010  . PALPITATIONS, RECURRENT 07/18/2010  . DYSPNEA ON EXERTION 07/18/2010  . 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 BREAST BIOPSY Left 05/11/2013   stereo biopsy  . CARDIAC ELECTROPHYSIOLOGY STUDY AND ABLATION  04/24/11   [facility]  . CLOSED MANIPULATION SHOULDER WITH STERIOD INJECTION Right 02/16/2015   Procedure: CLOSED MANIPULATION SHOULDER WITH STEROID INJECTION;  Surgeon: Christena Flake, MD;  Location: [facility];  Service: Orthopedics;  Laterality: Right;  . TONSILLECTOMY      Prior to Admission medications   Medication Sig Start Date End Date Taking? Authorizing Provider  Calcium Citrate-Vitamin D (CALCIUM + D PO) Take by mouth.    Historical Provider, MD  cephALEXin (KEFLEX) 500 MG capsule Take 1 capsule (500 mg total) by mouth 3 (three) times daily. 09/09/16   Phineas Semen, MD  CORLANOR 5 MG TABS tablet Take 5 mg by mouth 2 (two) times daily. 01/07/15   Historical Provider, MD    fludrocortisone (FLORINEF) 0.1 MG tablet Take 100 mcg by mouth daily. 01/28/15   Historical Provider, MD  levonorgestrel (MIRENA) 20 MCG/24HR IUD 1 each by Intrauterine route once.      Historical Provider, MD  Multiple Vitamin (MULTIVITAMIN) capsule Take 1 capsule by mouth daily.    Historical Provider, MD  potassium citrate (UROCIT-K) 10 MEQ (1080 MG) SR tablet Take 2 tablets (20 mEq total) by mouth 2 (two) times daily with a meal. 04/14/15   Hildred Laser, MD  propranolol (INDERAL) 20 MG tablet TAKE 1 TABLET (20 MG TOTAL) BY MOUTH 4 TIMES DAILY. 12/03/14   Historical Provider, MD  topiramate (TOPAMAX) 25 MG tablet TAKE 1 TABLET (25 MG TOTAL) BY MOUTH 3 (THREE) TIMES A DAY. 12/03/14   Historical Provider, MD  vitamin B-12 (CYANOCOBALAMIN) 1000 MCG tablet Take 1,000 mcg by mouth daily.    Historical Provider, MD  zolpidem (AMBIEN) 5 MG tablet Take 5-10 mg by mouth at bedtime as needed.      Historical Provider, MD    Allergies Erythromycin  Family History  Problem Relation Age of Onset  . Heart attack Maternal Grandmother 50    deceased  . Hypertension Father     and high cholesterol   . Hypertension Mother     and high cholesterol     Social History Social History  Substance Use Topics  . Smoking status: Never Smoker  . Smokeless tobacco: Never Used     Comment: tobacco use - no   . Alcohol use No     Comment: rare    Review of Systems Constitutional: No fever/chills Eyes: No visual changes.She does report that she felt like her eyes were "whiting out" and a blanket was going over them, but this is resolved. ENT: No sore throat. Cardiovascular: Denies chest pain. Respiratory: Denies shortness of breath. Gastrointestinal: No abdominal pain.  No vomiting.  No diarrhea.  No constipation. Genitourinary: Negative for dysuria. Musculoskeletal: Negative for back pain. Skin: Negative for rash. Neurological: Negative for headaches, focal weakness or numbness.He reports  chronically fatigued and tired.  10-point ROS otherwise negative.  ____________________________________________   PHYSICAL EXAM:  VITAL SIGNS: ED Triage Vitals  Enc Vitals Group     BP 10/12/16 1634 106/63     Pulse Rate 10/12/16 1634 (!) 45     Resp 10/12/16 1634 18     Temp 10/12/16 1634 98 F (36.7 C)     Temp Source 10/12/16 1634 Oral     SpO2 10/12/16 1634 100 %     Weight 10/12/16 1635 118 lb (53.5 kg)     Height 10/12/16 1635 5\' 6"  (1.676 m)     Head Circumference --      Peak Flow --      Pain Score --      Pain Loc --      Pain Edu? --      Excl. in GC? --     Constitutional: Alert and oriented. Well appearing and in no acute distress. Eyes: Conjunctivae are normal. PERRL. EOMI. Head: Atraumatic. Nose: No congestion/rhinnorhea. Mouth/Throat: Mucous membranes are moist.  Oropharynx  non-erythematous. Neck: No stridor.   Cardiovascular: Slightly bradycardic rate, regular rhythm. Grossly normal heart sounds.  Good peripheral circulation. Respiratory: Normal respiratory effort.  No retractions. Lungs CTAB. Gastrointestinal: Soft and nontender. No distention. Does not appear gravid. Patient reports she is not pregnant. Musculoskeletal: No lower extremity tenderness nor edema.  Neurologic:  Normal speech and language. No gross focal neurologic deficits are appreciated.  Skin:  Skin is warm, dry and intact. No rash noted. Psychiatric: Mood and affect are normal. Speech and behavior are normal.  ____________________________________________   LABS (all labs ordered are listed, but only abnormal results are displayed)  Labs Reviewed  BASIC METABOLIC PANEL - Abnormal; Notable for the following:       Result Value   Potassium 3.4 (*)    Glucose, Bld 111 (*)    Calcium 8.7 (*)    All other components within normal limits  CBC  MAGNESIUM  URINALYSIS, COMPLETE (UACMP) WITH MICROSCOPIC  CBG MONITORING, ED   ____________________________________________  EKG  ED  ECG REPORT I, QUALE, MARK, the attending physician, personally viewed and interpreted this ECG.  Date: 10/12/2016 EKG Time: 1630 Rate: 45 Rhythm: Sinus bradycardia QRS Axis: normal Intervals: normal ST/T Wave abnormalities: normal Conduction Disturbances: none Narrative Interpretation: unremarkable except for mild bradycardia  ____________________________________________  RADIOLOGY  No noted indication for chest x-ray. No chest pain or trouble breathing.  ____________________________________________   PROCEDURES  Procedure(s) performed: None  Procedures  Critical Care performed: No  ____________________________________________   INITIAL IMPRESSION / ASSESSMENT AND PLAN / ED COURSE  Pertinent labs & imaging results that were available during my care of the patient were reviewed by me and considered in my medical decision making (see chart for details).  Dispense for evaluation of near-syncope. She now reports feeling improved, is resting comfortably in no distress. She has some mild hypotension on arrival, but reports this is normal for her, and takes Florinef for chronically low blood pressure. Has a history of tachyarrhythmias, also previous near-syncope, and reports she also gets orthostatic easily.   Patient shows no evidence of hemodynamic instability. She has been compliant with her medication therapy, denies additional nadolol, and is compliant with her use of Florinef. Does not appear to be in adrenal shock/acute insufficiency. She doesn't along history of cardiac near-syncopal episodes, as well as chronic weakness and reports she has not been staying well hydrated. I suspect the patient had some type of orthostatic hypotension or a vasovagal type episode. Now with full recovery.     Pulmonary Embolism Rule-out Criteria (PERC rule)                        If YES to ANY of the following, the PERC rule is not satisfied and cannot be used to rule out PE in this  patient (consider d-dimer or imaging depending on pre-test probability).                      If NO to ALL of the following, AND the clinician's pre-test probability is <15%, the [facility] rule is satisfied and there is no need for further workup (including no need to obtain a d-dimer) as the post-test probability of pulmonary embolism is <2%.                      Mnemonic is HAD CLOTS   H - hormone use (exogenous estrogen)     Mirena noted. No. A - age >  50                                                 No. D - DVT/PE history                                      No.   C - coughing blood (hemoptysis)                 No. L - leg swelling, unilateral                             No. O - O2 Sat on Room Air < 95%                  No. T - tachycardia (HR ? 100)                         No. S - surgery or trauma, recent                      No.   Based on my evaluation of the patient, including application of this decision instrument, further testing to evaluate for pulmonary embolism is not indicated at this time.    ----------------------------------------- 8:44 PM on 10/12/2016 -----------------------------------------  The patient is awake and alert, ambulating and feels much better. She denies any ongoing symptomatology. She will follow closely with her cardiologist and primary. She feels comfortable with the plan to go home, family taking her. Return precautions and treatment recommendations and follow-up discussed with the patient who is agreeable with the plan.   ____________________________________________   FINAL CLINICAL IMPRESSION(S) / ED DIAGNOSES  Final diagnoses:  Near syncope      NEW MEDICATIONS STARTED DURING THIS VISIT:  New Prescriptions   No medications on file     Note:  This document was prepared using Dragon voice recognition software and may include unintentional dictation errors.     Sharyn CreamerMark Quale, MD 10/12/16 2045

## 2016-10-12 NOTE — Discharge Instructions (Signed)
Please call your regular doctor and cardiologist as soon as possible to schedule the next available clinic appointment to follow up with him/her regarding your visit to the ED and your symptoms.  Return to the Emergency Department (ED)  if you have any further near syncopal episodes (pass out again) or develop ANY chest pain, pressure, tightness, trouble breathing, sudden sweating, or other symptoms that concern you.

## 2017-01-12 IMAGING — MG MM DIAG BREAST TOMO BILATERAL
8 of 15 series · 8 of 31 positions shown · non-contrast
Comparison: 05/04/2013, 04/24/2013, 04/15/2013.

CLINICAL DATA: Patient underwent a benign concordant left breast
stereotactic biopsy in Thursday April, 2013. She was asked to return for
six-month follow-up evaluation. However, she did not return for that
followup evaluation and now returns. She is currently asymptomatic.
There is no family history of breast cancer

EXAM:
DIGITAL DIAGNOSTIC BILATERAL MAMMOGRAM WITH 3D TOMOSYNTHESIS WITH
CAD
ULTRASOUND RIGHT BREAST

[L CC (1 of 3)]
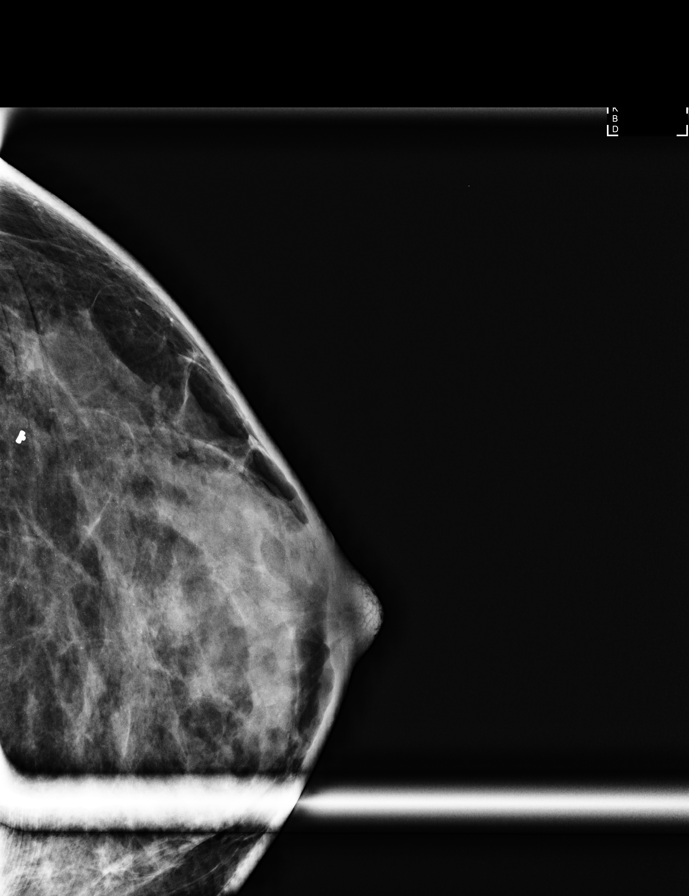

[L CC (2 of 3)]
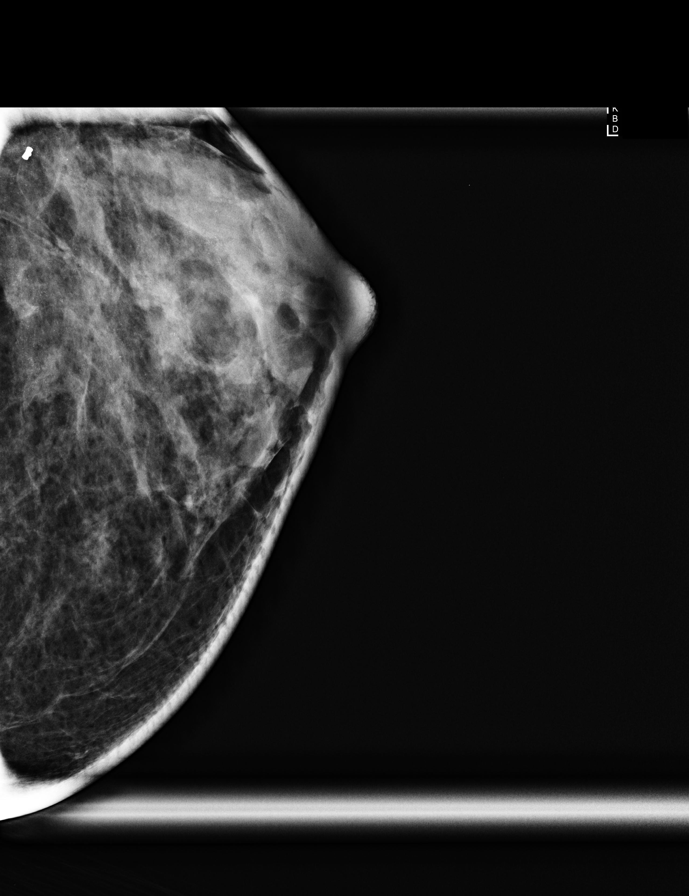

[L ML]
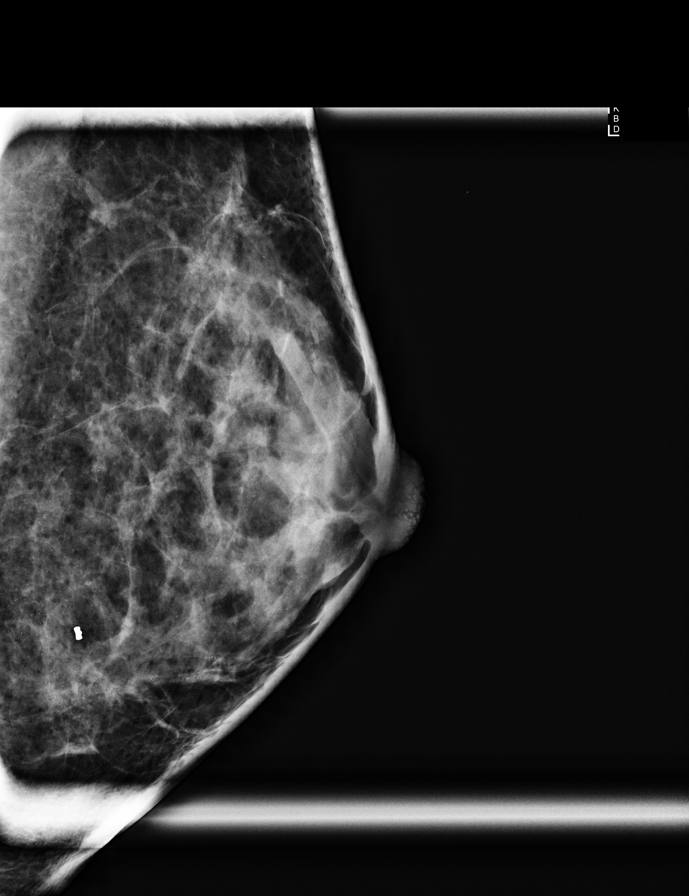

[L CC synth-2D]
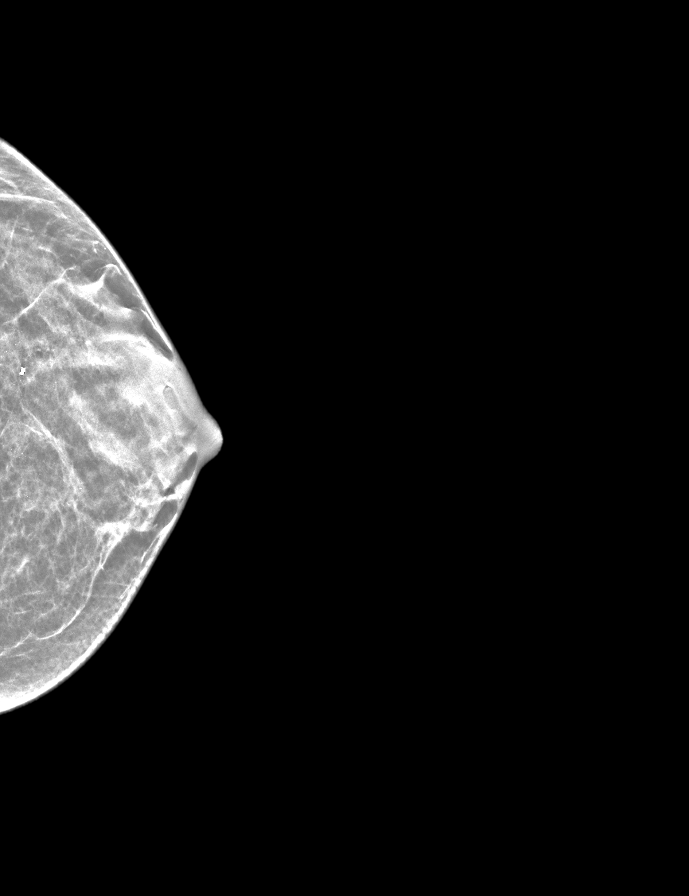

[R MLO]
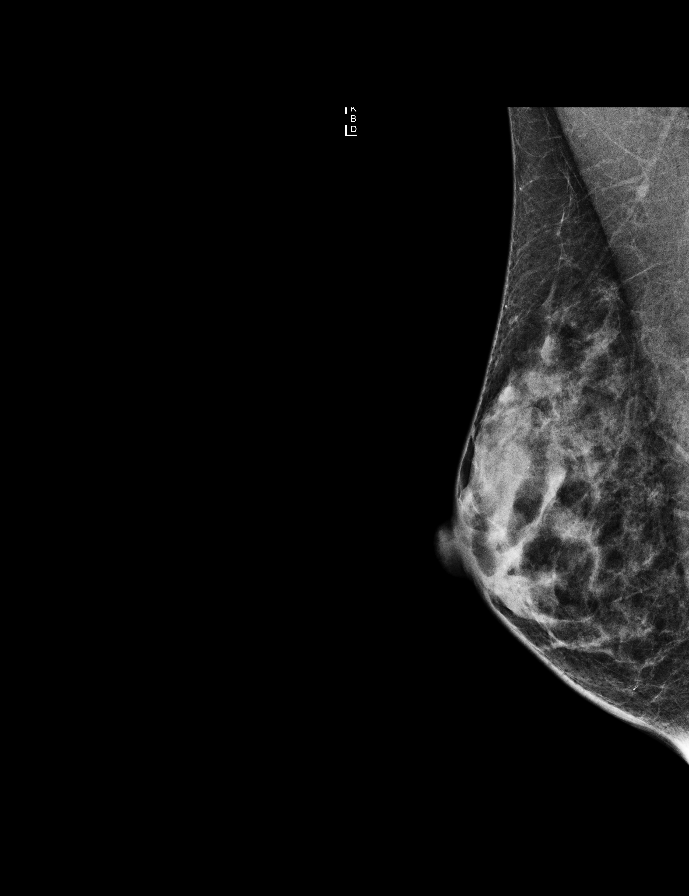

[L CC (3 of 3)]
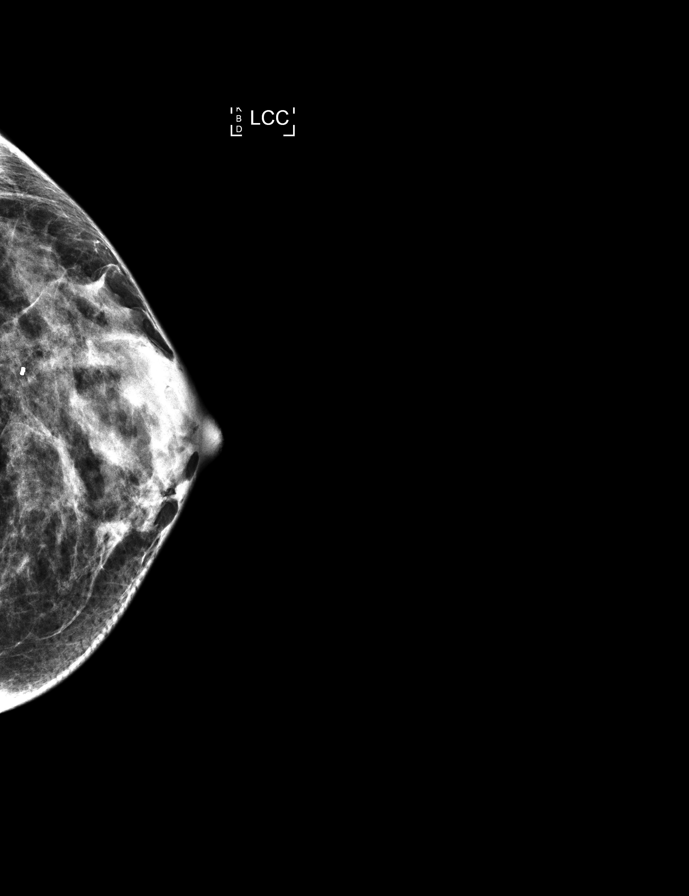

[R CC synth-2D]
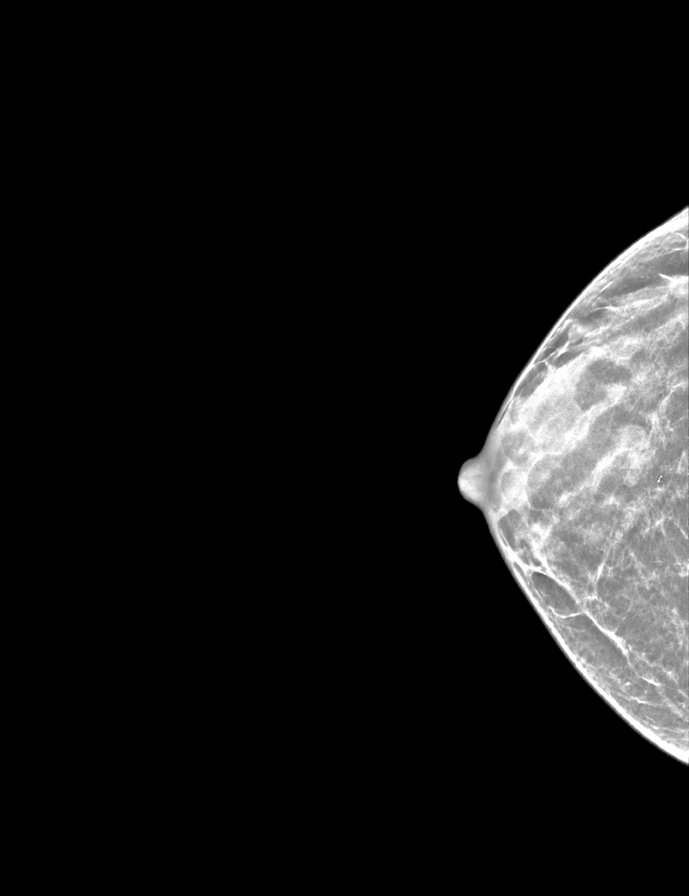

[R CC]
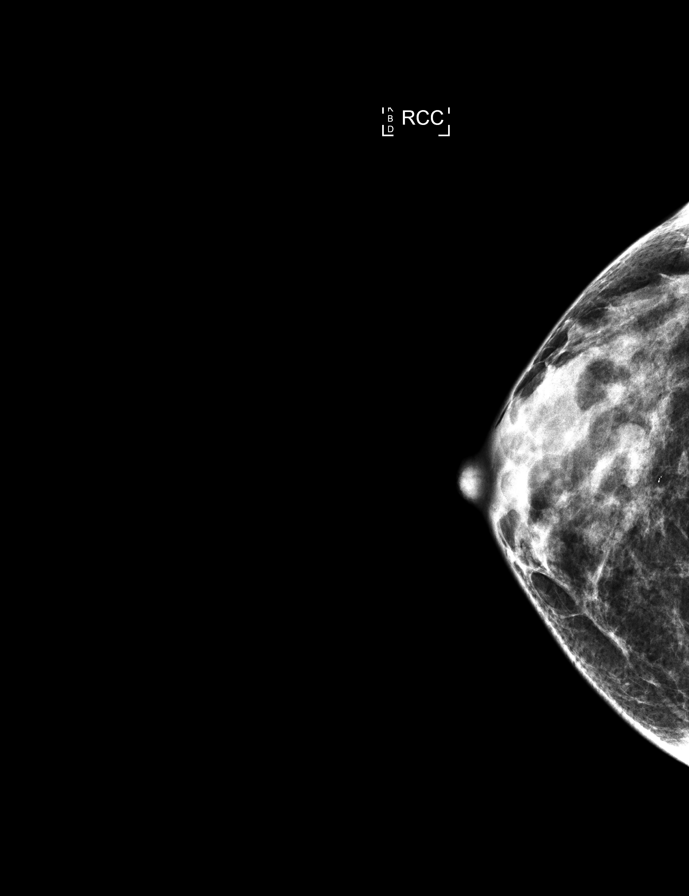

[8 of 31 positions shown; findings below may reference images not displayed]

ACR Breast Density Category c: The breast tissue is heterogeneously
dense, which may obscure small masses.
FINDINGS: There are diffuse punctate calcifications present throughout the
left breast. These are more prominent within the inferior and
lateral left breast. There is a clip in this region related to
patient's previous benign stereotactic biopsy. There are no
worrisome linear or branching forms and the calcifications appear
stable. There are several scattered but less numerous punctate
calcifications present within the right breast. There are 2 adjacent
oval, partially obscured masses located within the right breast at
approximately the [DATE] to 12 o'clock position. There is no
distortion within either breast.

Mammographic images were processed with CAD.

On physical exam, there is no discrete palpable abnormality within
the superior right breast.

Targeted ultrasound is performed, showing 2 adjacent simple cysts
located within the right breast at the 11:30 o'clock position 1 cm
from the nipple corresponding to the mammographic changes. The
largest cyst measures 9 mm in size. There is no mass, distortion, or
worrisome shadowing.
IMPRESSION: 1. Stable diffuse left breast calcifications consistent with
fibrocystic calcification.
2. 2 adjacent simple cysts located within the right breast at the
11:30 o'clock position corresponding to mammographic changes in this
region.
Recommend bilateral screening mammography in 1 year.

RECOMMENDATION:
Bilateral screening mammography in 1 year.

I have discussed the findings and recommendations with the patient.
Results were also provided in writing at the conclusion of the
visit. If applicable, a reminder letter will be sent to the patient
regarding the next appointment.

BI-RADS CATEGORY  2: Benign.

## 2017-03-21 DIAGNOSIS — I872 Venous insufficiency (chronic) (peripheral): Secondary | ICD-10-CM | POA: Insufficient documentation

## 2017-06-26 ENCOUNTER — Other Ambulatory Visit: Payer: Self-pay | Admitting: Family Medicine

## 2017-06-26 DIAGNOSIS — Z1239 Encounter for other screening for malignant neoplasm of breast: Secondary | ICD-10-CM

## 2017-07-24 ENCOUNTER — Ambulatory Visit
Admission: RE | Admit: 2017-07-24 | Discharge: 2017-07-24 | Disposition: A | Discharge status: Home / Self Care | Payer: Medicaid Other | Source: Ambulatory Visit | Attending: Family Medicine | Admitting: Family Medicine

## 2017-07-24 DIAGNOSIS — Z1231 Encounter for screening mammogram for malignant neoplasm of breast: Secondary | ICD-10-CM | POA: Diagnosis not present

## 2017-07-24 DIAGNOSIS — Z1239 Encounter for other screening for malignant neoplasm of breast: Secondary | ICD-10-CM

## 2017-07-24 DIAGNOSIS — R928 Other abnormal and inconclusive findings on diagnostic imaging of breast: Secondary | ICD-10-CM | POA: Insufficient documentation

## 2017-07-29 ENCOUNTER — Other Ambulatory Visit: Payer: Self-pay | Admitting: Family Medicine

## 2017-07-29 DIAGNOSIS — N6310 Unspecified lump in the right breast, unspecified quadrant: Secondary | ICD-10-CM

## 2017-07-29 DIAGNOSIS — R928 Other abnormal and inconclusive findings on diagnostic imaging of breast: Secondary | ICD-10-CM

## 2017-08-06 ENCOUNTER — Ambulatory Visit
Admission: RE | Admit: 2017-08-06 | Discharge: 2017-08-06 | Disposition: A | Discharge status: Home / Self Care | Payer: Medicaid Other | Source: Ambulatory Visit | Attending: Family Medicine | Admitting: Family Medicine

## 2017-08-06 DIAGNOSIS — R928 Other abnormal and inconclusive findings on diagnostic imaging of breast: Secondary | ICD-10-CM

## 2017-08-06 DIAGNOSIS — N6310 Unspecified lump in the right breast, unspecified quadrant: Secondary | ICD-10-CM

## 2017-08-06 DIAGNOSIS — N6001 Solitary cyst of right breast: Secondary | ICD-10-CM | POA: Insufficient documentation

## 2017-12-25 HISTORY — PX: FINGER SURGERY: SHX640

## 2018-02-06 ENCOUNTER — Encounter: Payer: Self-pay | Admitting: Occupational Therapy

## 2018-02-06 ENCOUNTER — Other Ambulatory Visit: Payer: Self-pay

## 2018-02-06 ENCOUNTER — Ambulatory Visit: Payer: Medicaid Other | Attending: Orthopedic Surgery | Admitting: Occupational Therapy

## 2018-02-06 DIAGNOSIS — R600 Localized edema: Secondary | ICD-10-CM | POA: Diagnosis present

## 2018-02-06 DIAGNOSIS — M6281 Muscle weakness (generalized): Secondary | ICD-10-CM | POA: Diagnosis present

## 2018-02-06 DIAGNOSIS — M79645 Pain in left finger(s): Secondary | ICD-10-CM | POA: Diagnosis not present

## 2018-02-06 DIAGNOSIS — M79644 Pain in right finger(s): Secondary | ICD-10-CM | POA: Diagnosis present

## 2018-02-06 DIAGNOSIS — M25641 Stiffness of right hand, not elsewhere classified: Secondary | ICD-10-CM

## 2018-02-06 DIAGNOSIS — L905 Scar conditions and fibrosis of skin: Secondary | ICD-10-CM | POA: Diagnosis present

## 2018-02-06 DIAGNOSIS — M25642 Stiffness of left hand, not elsewhere classified: Secondary | ICD-10-CM | POA: Diagnosis present

## 2018-02-06 NOTE — Patient Instructions (Signed)
Contrast on bilateral hands  Soft scar massage and soft textures- frequently - but short periods of time  On scar - R 5th PIP  Large silicon digi sleeve on L 4th  And 5th R hand - to tolerance  Gentle PROM for 5th R - DIP flexion and PIP extention Bilateral hands - tendon glides blocked each

## 2018-02-06 NOTE — Therapy (Signed)
[facility] [address] [place], [address] Phone: [phone]   Fax:  [phone]  Occupational Therapy Evaluation  Patient Details  Name: Lisa Clay MRN: [id number] Date of Birth: 10/26/1970 Referring Provider: Melvyn Novas   Encounter Date: 02/06/2018  OT End of Session - 02/06/18 1708    Visit Number  1    Number of Visits  12    Date for OT Re-Evaluation  03/20/18    OT Start Time  0947    OT Stop Time  1050    OT Time Calculation (min)  63 min    Activity Tolerance  Patient tolerated treatment well;Patient limited by pain    Behavior During Therapy  [facility] for tasks assessed/performed       Past Medical History:  Diagnosis Date  . Atrial tachycardia (HCC)   . Dysrhythmia    Hx: PSVT. Current: Postural Orthostatic Tachycardia Syndrome, Inappropriate Sinus Node Tachycardia  . Headache    migraines, rare.  . Kidney stones   . Palpitations    [facility] 2/12  . Shortness of breath dyspnea    on exertion, secondary to heart issues  . Sinus tachycardia    [facility] 2/12  . Syncope    secondary to heart issues    Past Surgical History:  Procedure Laterality Date  . BREAST BIOPSY Left 05/11/2013   stereo biopsy  . CARDIAC ELECTROPHYSIOLOGY STUDY AND ABLATION  04/24/11   [facility]  . CLOSED MANIPULATION SHOULDER WITH STERIOD INJECTION Right 02/16/2015   Procedure: CLOSED MANIPULATION SHOULDER WITH STEROID INJECTION;  Surgeon: Christena Flake, MD;  Location: [facility];  Service: Orthopedics;  Laterality: Right;  . FINGER SURGERY Right 12/25/2017   explorative surgery on R5th PIP   . TONSILLECTOMY      There were no vitals filed for this visit.  Subjective Assessment - 02/06/18 1602    Subjective   Swelling and pain started after fatherday weekend - got more painful and then Dr send me to Dr Orlan Leavens and had 12/24/17 exploritive surgery for my R 5th PIP - it is swollen, red  and so  painful - I cannot even tolerate sheet on my finger- the pain is so bad- and there is no end - and my L ring finger started swelling and painful in July     Patient Stated Goals  I just want the pain , swelling better and the use of my hands - and to know what causing it     Currently in Pain?  Yes    Pain Score  8     Pain Location  Finger (Comment which one)    Pain Orientation  Right    Pain Descriptors / Indicators  Aching;Throbbing;Sharp;Shooting    Pain Type  Surgical pain    Pain Onset  More than a month ago    Pain Frequency  Constant    Aggravating Factors   anything - bumping it         [facility] OT Assessment - 02/06/18 0001      Assessment   Medical Diagnosis  R 5th PIP explorative surgery     Referring Provider  Ortmann    Hand Dominance  Right      Home  Environment   Lives With  Family      Prior Function   Vocation  On disability    Leisure  Was ER nurse, likes to bake, 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,  house work , fish with her son ,      Edema   Edema  5th PIP R 6 cm , L 4.5 cm , 4th digit L PIP 5.4 R , L 6.3      Right Hand AROM   R Little  MCP 0-90  80 Degrees    R Little PIP 0-100  80 Degrees   -70 extention    R Little DIP 0-70  10 Degrees      Left Hand AROM   L Ring  MCP 0-90  85 Degrees    L Ring PIP 0-100  75 Degrees      done contrast with pt - and review HEP -  Hand out provided     Contrast on bilateral hands  Soft scar massage and soft textures- frequently - but short periods of time  On scar - R 5th PIP  Large silicon digi sleeve on L 4th  And 5th R hand - to tolerance  Gentle PROM for 5th R - DIP flexion and PIP extention Bilateral hands - tendon glides blocked each                OT Education - 02/06/18 1708    Education Details  findings of eval and homeprogram    Person(s) Educated  Patient    Methods  Explanation;Demonstration;Handout    Comprehension  Verbalized understanding;Returned demonstration       OT Short  Term Goals - 02/06/18 1911      OT SHORT TERM GOAL #1   Title  Pt to be independent in HEP to decrease edema, pain , scar tissue and increase ROM in R 5th digit    Baseline  pain more than 10/10 , edema 1.5 cm increase at PIP of 5th , scar tissue adhere , only 10 degrees of AROM     Time  3    Period  Weeks    Status  New    Target Date  02/27/18      OT SHORT TERM GOAL #2   Title  Pain on PRWHE improve with more than 15 points     Baseline  PRWHE for pain at eval 43/50 and hyper sensitivie to everything including sheet, air ,    Time  3    Period  Weeks    Status  New    Target Date  02/27/18        OT Long Term Goals - 02/06/18 1913      OT LONG TERM GOAL #1   Title  R 5th AROM PIP and DIP increase with more than 30 degrees to turn doorknob     Baseline  only 10 degrees AROM at PIP( 80 flexion and extention -70 degrees)  and DIP flexion 10 degrees     Time  5    Period  Weeks    Status  New    Target Date  03/13/18      OT LONG TERM GOAL #2   Title  Function score on PRWHE improve for pt to use hand more than 25 points     Baseline  at eval PRWHE function score was 20/50    Time  6    Period  Weeks    Status  New    Target Date  03/20/18            Plan - 02/06/18 1709    Clinical Impression Statement  Pt present   6 1/2 wks  s/p R 5th PIP arthrotomy - pt has severe edema, pain , hyper sensitivity in R 5th PIP - severe stiffness in PIP and DIP of R 5th - PIP flexion 80 and exention -70 degrees ; DIP flexion 10 degrees- pain 10/10 - hyper sensitivity to even sheet, air , massage and textures-  the pain and edema  started  middle June in that finger - and then had  she had 7/16 surgery - pt also present  at eval  with edema and pain now in L 4th PIP - with decrease ROM - pt finished round of prednisone yesterday and cannot tell difference that it helped - all of this decreasing her functional use of R and L hand in ADL's and IADl's     Occupational performance deficits  (Please refer to evaluation for details):  ADL's;IADL's;Rest and Sleep;Play;Leisure;Social Participation    Rehab Potential  Fair    Current Impairments/barriers affecting progress:  6 wks s/p surgery with no therapy yet - flexor contracture at PIP and very little at DIP     OT Frequency  2x / week    OT Duration  6 weeks    OT Treatment/Interventions  Self-care/ADL training;Therapeutic exercise;Patient/family education;Splinting;Paraffin;Fluidtherapy;Ultrasound;Contrast Bath;Manual Therapy;Passive range of motion;Scar mobilization    Plan  assess progress with HEP and upgrade -    OT Home Exercise Plan  see pt instruction    Consulted and Agree with Plan of Care  Patient       Patient will benefit from skilled therapeutic intervention in order to improve the following deficits and impairments:  Pain, Impaired flexibility, Increased edema, Decreased coordination, Decreased scar mobility, Impaired sensation, Decreased strength, Decreased range of motion, Impaired UE functional use  Visit Diagnosis: Pain in finger of both hands - Plan: Ot plan of care cert/re-cert  Stiffness of right hand, not elsewhere classified - Plan: Ot plan of care cert/re-cert  Stiffness of left hand, not elsewhere classified - Plan: Ot plan of care cert/re-cert  Localized edema - Plan: Ot plan of care cert/re-cert  Scar condition and fibrosis of skin - Plan: Ot plan of care cert/re-cert  Muscle weakness (generalized) - Plan: Ot plan of care cert/re-cert    Problem List Patient Active Problem List   Diagnosis Date Noted  . Encounter for gynecological examination without abnormal finding 04/06/2015  . Mixed incontinence 04/06/2015  . 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 Bursitis of shoulder 12/29/2014  . Tendinitis of right shoulder 12/29/2014  . Other shoulder lesions, right shoulder 12/29/2014  . Episode of syncope 05/11/2014  . Atrial flutter (HCC) 09/05/2012  . CFIDS (chronic fatigue and immune  dysfunction syndrome) (HCC) 09/05/2012  . 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 Postural orthostatic tachycardia syndrome 08/30/2011  . Fast heart beat 08/30/2011  . Supraventricular arrhythmia 05/28/2011  . UNSPECIFIED MYOCARDITIS 07/20/2010  . SINUS TACHYCARDIA 07/18/2010  . DYSAUTONOMIA 07/18/2010  . PALPITATIONS, RECURRENT 07/18/2010  . DYSPNEA ON EXERTION 07/18/2010  . 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    Oletta CohnuPreez, Jaxon Mynhier OTR/L,CLT 02/06/2018, 7:22 PM  [facility] [address] [place], [place], [id number] Phone: [phone]   Fax:  [phone]  Name: Lisa Clay MRN: [id number] Date of Birth: 11-01-1970

## 2018-02-14 ENCOUNTER — Ambulatory Visit: Payer: Medicaid Other | Attending: Orthopedic Surgery | Admitting: Occupational Therapy

## 2018-02-14 DIAGNOSIS — M6281 Muscle weakness (generalized): Secondary | ICD-10-CM

## 2018-02-14 DIAGNOSIS — M25642 Stiffness of left hand, not elsewhere classified: Secondary | ICD-10-CM | POA: Diagnosis present

## 2018-02-14 DIAGNOSIS — M79644 Pain in right finger(s): Secondary | ICD-10-CM | POA: Diagnosis present

## 2018-02-14 DIAGNOSIS — M25641 Stiffness of right hand, not elsewhere classified: Secondary | ICD-10-CM | POA: Diagnosis present

## 2018-02-14 DIAGNOSIS — M79645 Pain in left finger(s): Secondary | ICD-10-CM | POA: Insufficient documentation

## 2018-02-14 DIAGNOSIS — L905 Scar conditions and fibrosis of skin: Secondary | ICD-10-CM | POA: Insufficient documentation

## 2018-02-14 DIAGNOSIS — R600 Localized edema: Secondary | ICD-10-CM

## 2018-02-14 NOTE — Therapy (Signed)
[facility] PHYS[facility]ORTS MEDICINE [address], [place], [address] Phone: [phone]   Fax:  [phone]  Occupational Therapy Treatment  Patient Details  Name: Lisa Clay MRN: [id number] Date of Birth: 24-Oct-1970 Referring Provider: Melvyn Novas   Encounter Date: 02/14/2018  OT End of Session - 02/14/18 1722    Visit Number  2    Number of Visits  12    Date for OT Re-Evaluation  03/20/18    OT Start Time  0948    OT Stop Time  1050    OT Time Calculation (min)  62 min    Activity Tolerance  Patient tolerated treatment well;Patient limited by pain    Behavior During Therapy  [facility] for tasks assessed/performed       Past Medical History:  Diagnosis Date  . Atrial tachycardia (HCC)   . Dysrhythmia    Hx: PSVT. Current: Postural Orthostatic Tachycardia Syndrome, Inappropriate Sinus Node Tachycardia  . Headache    migraines, rare.  . Kidney stones   . Palpitations    [facility] 2/12  . Shortness of breath dyspnea    on exertion, secondary to heart issues  . Sinus tachycardia    [facility] 2/12  . Syncope    secondary to heart issues    Past Surgical History:  Procedure Laterality Date  . BREAST BIOPSY Left 05/11/2013   stereo biopsy  . CARDIAC ELECTROPHYSIOLOGY STUDY AND ABLATION  04/24/11   [facility]  . CLOSED MANIPULATION SHOULDER WITH STERIOD INJECTION Right 02/16/2015   Procedure: CLOSED MANIPULATION SHOULDER WITH STEROID INJECTION;  Surgeon: Christena Flake, MD;  Location: [facility];  Service: Orthopedics;  Laterality: Right;  . FINGER SURGERY Right 12/25/2017   explorative surgery on R5th PIP   . TONSILLECTOMY      There were no vitals filed for this visit.  Subjective Assessment - 02/14/18 1715    Subjective   Pain is maybe little better but then I am not great fan of the pain scale - I did wear those compression sleeves at night time and much of the day on R pinkie and L ring finger - did the  exercises - but that tip of pinkie is tight     Patient Stated Goals  I just want the pain , swelling better and the use of my hands - and to know what causing it     Currently in Pain?  Yes    Pain Score  2     Pain Location  Finger (Comment which one)    Pain Orientation  Right;Left    Pain Descriptors / Indicators  Aching;Tightness;Throbbing;Tender    Pain Type  Surgical pain    Pain Onset  More than a month ago    Pain Frequency  Constant         [facility] OT Assessment - 02/14/18 0001      Edema   Edema  R 5th PIP decrease .5 cm ,and R 4th PIP decrease .3 cm      Right Hand AROM   R Little  MCP 0-90  92 Degrees    R Little PIP 0-100  75 Degrees   -55 at [facility] - -45 after LMB , PROM 40   R Little DIP 0-70  10 Degrees      Left Hand AROM   L Ring  MCP 0-90  90 Degrees    L Ring PIP 0-100  75 Degrees  measurement taken for AROM and edema   see flowsheet  progress everywhere except PIP on 4th R hand        OT Treatments/Exercises (OP) - 02/14/18 0001      LUE Fluidotherapy   Number Minutes Fluidotherapy  11 Minutes    LUE Fluidotherapy Location  Hand    Comments  AROM of digits - and ice 2 x for 1 min       RUE Contrast Bath   Time  11 minutes    Comments  on R hand with LMB PIP extention splint on 5th 2nd and 3rd  cycle for 3 min      done contrast with pt and- used on R hand LMB splint for 5th PIP exention - 3 min  - and tolerated okay   Soft tissue mobs to lat bands , graston tool nr 2 for sweeping on volar hand andand forearm and brushing on volar digits 5th on R and 4th on L   Contrast done to R , while L in fluido with ice 2 cycles  Tendon glides   did show increase PIP and MC flexion-   Also fitted pt with neoprene banana splint on R 5th - to wear 10-30 min at time   could not tolerate on top of PIP - scar - tender  Pt to try again in few days   Fitted with new silicaon sleeves  And then buddy strap fitted over and on R 5th and 4th -  to decrease deviation of distal to PIP of 5th and more AROM and AAROM to PIP during day        Same hEP as last time  But add LMB extention splint to R 5th PIP during 2nd and 3rd cycle of contrast  Try in 3-4 days neoprene Banana extention splint on R 5th PIP extention - 15 -30 min at time   And then cont with silicon sleeve and wear buddy strap during day on and off for 15 min to hour - on and off to use 4th to do AROM flexion and extention to 5th  And still work on decreasing pain  And edema      OT Education - 02/14/18 1721    Education Details  changes to HEP, progress and use of splints and buddy strap    Person(s) Educated  Patient    Methods  Explanation;Demonstration;Handout    Comprehension  Returned demonstration       OT Short Term Goals - 02/06/18 1911      OT SHORT TERM GOAL #1   Title  Pt to be independent in HEP to decrease edema, pain , scar tissue and increase ROM in R 5th digit    Baseline  pain more than 10/10 , edema 1.5 cm increase at PIP of 5th , scar tissue adhere , only 10 degrees of AROM     Time  3    Period  Weeks    Status  New    Target Date  02/27/18      OT SHORT TERM GOAL #2   Title  Pain on PRWHE improve with more than 15 points     Baseline  PRWHE for pain at eval 43/50 and hyper sensitivie to everything including sheet, air ,    Time  3    Period  Weeks    Status  New    Target Date  02/27/18        OT Long Term Goals -  02/06/18 1913      OT LONG TERM GOAL #1   Title  R 5th AROM PIP and DIP increase with more than 30 degrees to turn doorknob     Baseline  only 10 degrees AROM at PIP( 80 flexion and extention -70 degrees)  and DIP flexion 10 degrees     Time  5    Period  Weeks    Status  New    Target Date  03/13/18      OT LONG TERM GOAL #2   Title  Function score on PRWHE improve for pt to use hand more than 25 points     Baseline  at eval PRWHE function score was 20/50    Time  6    Period  Weeks    Status  New     Target Date  03/20/18            Plan - 02/14/18 1722    Clinical Impression Statement  Pt present 7 1/2 wks s/p 5th PIP athrotomy - pt seend Rheumatoligst  yesterday and starting Methotrexate-Right hand with contracture and synovitis right fifth MCP PIP. Left hand with synovitis along the flexor tendons of the left fourth. MCP PIP synovitis cont have increase edema and pain in L 4th PIP and MC , and R 5th PIP and MC - scar tender,  0-10 degrees of PROM of DIP , pt did show progress in pain and AROM and PROM at 5th PIP and MC since last time    Occupational performance deficits (Please refer to evaluation for details):  ADL's;IADL's;Rest and Sleep;Play;Leisure;Social Participation    Rehab Potential  Fair    Current Impairments/barriers affecting progress:  6 wks s/p surgery with no therapy yet - flexor contracture at PIP and very little at DIP     OT Frequency  2x / week    OT Duration  6 weeks    OT Treatment/Interventions  Self-care/ADL training;Therapeutic exercise;Patient/family education;Splinting;Paraffin;Fluidtherapy;Ultrasound;Contrast Bath;Manual Therapy;Passive range of motion;Scar mobilization    Plan  assess progress with HEP and use of splints and buddy strap     Clinical Decision Making  Multiple treatment options, significant modification of task necessary    OT Home Exercise Plan  see pt instruction    Consulted and Agree with Plan of Care  Patient       Patient will benefit from skilled therapeutic intervention in order to improve the following deficits and impairments:  Pain, Impaired flexibility, Increased edema, Decreased coordination, Decreased scar mobility, Impaired sensation, Decreased strength, Decreased range of motion, Impaired UE functional use  Visit Diagnosis: Pain in finger of both hands  Stiffness of right hand, not elsewhere classified  Stiffness of left hand, not elsewhere classified  Localized edema  Scar condition and fibrosis of skin  Muscle  weakness (generalized)    Problem List Patient Active Problem List   Diagnosis Date Noted  . Encounter for gynecological examination without abnormal finding 04/06/2015  . Mixed incontinence 04/06/2015  . 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 Bursitis of shoulder 12/29/2014  . Tendinitis of right shoulder 12/29/2014  . Other shoulder lesions, right shoulder 12/29/2014  . Episode of syncope 05/11/2014  . Atrial flutter (HCC) 09/05/2012  . CFIDS (chronic fatigue and immune dysfunction syndrome) (HCC) 09/05/2012  . 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 Postural orthostatic tachycardia syndrome 08/30/2011  . Fast heart beat 08/30/2011  . Supraventricular arrhythmia 05/28/2011  . UNSPECIFIED MYOCARDITIS 07/20/2010  . SINUS TACHYCARDIA 07/18/2010  . DYSAUTONOMIA 07/18/2010  .  PALPITATIONS, RECURRENT 07/18/2010  . DYSPNEA ON EXERTION 07/18/2010  . 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    Oletta Cohn OTR/L,CLT 02/14/2018, 5:31 PM  [facility] PHYSICAL AND SPORTS MEDICINE [address] [place], [address] Phone: [phone]   Fax:  [phone]  Name: Lisa Clay MRN: [id number] Date of Birth: Oct 01, 1970

## 2018-02-14 NOTE — Patient Instructions (Signed)
Same hEP as last time  But add LMB extention splint to R 5th PIP during 2nd and 3rd cycle of contrast  Try in 3-4 days neoprene Banana extention splint on R 5th PIP extention - 15 -30 min at time   And then cont with silicon sleeve and wear buddy strap during day on and off for 15 min to hour - on and off to use 4th to do AROM flexion and extention to 5th  And still work on decreasing pain  And edema

## 2018-02-21 ENCOUNTER — Ambulatory Visit: Payer: Medicaid Other | Admitting: Occupational Therapy

## 2018-02-21 DIAGNOSIS — M79645 Pain in left finger(s): Principal | ICD-10-CM

## 2018-02-21 DIAGNOSIS — M79644 Pain in right finger(s): Secondary | ICD-10-CM

## 2018-02-21 DIAGNOSIS — M25642 Stiffness of left hand, not elsewhere classified: Secondary | ICD-10-CM

## 2018-02-21 DIAGNOSIS — R600 Localized edema: Secondary | ICD-10-CM

## 2018-02-21 DIAGNOSIS — L905 Scar conditions and fibrosis of skin: Secondary | ICD-10-CM

## 2018-02-21 DIAGNOSIS — M25641 Stiffness of right hand, not elsewhere classified: Secondary | ICD-10-CM

## 2018-02-21 DIAGNOSIS — M6281 Muscle weakness (generalized): Secondary | ICD-10-CM

## 2018-02-21 NOTE — Therapy (Signed)
[facility] PHYSICAL [facility] SPORTS MEDICINE [address] [place], [address] Phone: [phone]   Fax:  [phone]  Occupational Therapy Treatment  Patient Details  Name: Lisa Clay MRN: [id number] Date of Birth: 07/26/1970 Referring Provider: Melvyn Novas   Encounter Date: 02/21/2018  OT End of Session - 02/21/18 1731    Visit Number  3    Number of Visits  12    Date for OT Re-Evaluation  03/20/18    OT Start Time  1015    OT Stop Time  1105    OT Time Calculation (min)  50 min    Activity Tolerance  Patient tolerated treatment well;Patient limited by pain    Behavior During Therapy  [facility] for tasks assessed/performed       Past Medical History:  Diagnosis Date  . Atrial tachycardia (HCC)   . Dysrhythmia    Hx: PSVT. Current: Postural Orthostatic Tachycardia Syndrome, Inappropriate Sinus Node Tachycardia  . Headache    migraines, rare.  . Kidney stones   . Palpitations    [facility] 2/12  . Shortness of breath dyspnea    on exertion, secondary to heart issues  . Sinus tachycardia    [facility] 2/12  . Syncope    secondary to heart issues    Past Surgical History:  Procedure Laterality Date  . BREAST BIOPSY Left 05/11/2013   stereo biopsy  . CARDIAC ELECTROPHYSIOLOGY STUDY AND ABLATION  04/24/11   [facility]  . CLOSED MANIPULATION SHOULDER WITH STERIOD INJECTION Right 02/16/2015   Procedure: CLOSED MANIPULATION SHOULDER WITH STEROID INJECTION;  Surgeon: Christena Flake, MD;  Location: [facility];  Service: Orthopedics;  Laterality: Right;  . FINGER SURGERY Right 12/25/2017   explorative surgery on R5th PIP   . TONSILLECTOMY      There were no vitals filed for this visit.  Subjective Assessment - 02/21/18 1727    Subjective   SInce last night both my fingers are sore in the joint and more swollen - did okay with ROM and spring splint - but the neoprene one I cannot do - I had last Friday my first Methotrexate  - my 2nd I am taking tonight - cannot tell difference    Patient Stated Goals  I just want the pain , swelling better and the use of my hands - and to know what causing it     Currently in Pain?  Yes    Pain Score  --   more than last time - don't like scale   Pain Location  Finger (Comment which one)    Pain Orientation  Right;Left       Pt arrive with same HEP as last time at [facility] - 55 ext R 5th PIP and flexion 75 degrees             OT Treatments/Exercises (OP) - 02/21/18 0001      RUE Paraffin   Number Minutes Paraffin  10 Minutes    RUE Paraffin Location  Hand    Comments  LMB PIP extention splint on for 5th       LUE Contrast Bath   Time  11 minutes    Comments  decrease edema and pain -         used on R hand LMB splint for 5th PIP exention - in paraffin   - and tolerated okay  Contrast this date on L   Soft tissue mobs to lat bands , graston  tool nr 2 for sweeping on volar hand andand forearm and brushing on volar digits 5th on R and 4th on L  - done vibration too PROM for PIP extention - with joint mobs and then DIP flexion attempted  Tendon glides -PROM and AAROM - block MC from hyper extention    Reinforce for pt to use neoprene banana splint on R 5th - to wear 10-30 min at time   could not tolerate on top of PIP - scar - tender  Pt to try again in few days   Fitted with new silicaon sleeves  And then buddy strap fitted over and on R 5th and 4th - to decrease deviation of distal to PIP of 5th and more AROM and AAROM to PIP during day - to cont with  Add putty teal for rolling -for stretch extention to PIP -and then keeping it up when over 20 reps          OT Education - 02/21/18 1731    Education Details  splint wearing - progress ,     Person(s) Educated  Patient    Methods  Explanation;Demonstration;Handout    Comprehension  Returned demonstration;Verbalized understanding       OT Short Term Goals - 02/06/18 1911      OT SHORT TERM  GOAL #1   Title  Pt to be independent in HEP to decrease edema, pain , scar tissue and increase ROM in R 5th digit    Baseline  pain more than 10/10 , edema 1.5 cm increase at PIP of 5th , scar tissue adhere , only 10 degrees of AROM     Time  3    Period  Weeks    Status  New    Target Date  02/27/18      OT SHORT TERM GOAL #2   Title  Pain on PRWHE improve with more than 15 points     Baseline  PRWHE for pain at eval 43/50 and hyper sensitivie to everything including sheet, air ,    Time  3    Period  Weeks    Status  New    Target Date  02/27/18        OT Long Term Goals - 02/06/18 1913      OT LONG TERM GOAL #1   Title  R 5th AROM PIP and DIP increase with more than 30 degrees to turn doorknob     Baseline  only 10 degrees AROM at PIP( 80 flexion and extention -70 degrees)  and DIP flexion 10 degrees     Time  5    Period  Weeks    Status  New    Target Date  03/13/18      OT LONG TERM GOAL #2   Title  Function score on PRWHE improve for pt to use hand more than 25 points     Baseline  at eval PRWHE function score was 20/50    Time  6    Period  Weeks    Status  New    Target Date  03/20/18            Plan - 02/21/18 1732    Clinical Impression Statement  Pt about 8 1/2 wks s/p 5th PIP athrotomy - pt started last Friday Methotrexate - report soreness and edema more since last night - done the HEP - but could not do neoprene extention splint for 5th - pt arrive again with -55  exte and flexion 75 - could get her if measured lateral to -35 degrees and 85 flexion - but not able to maintain - edema and pain limiting her - no progress at DIP of 5th -and then L 4h cont to show discoloring , edema and pain     Occupational performance deficits (Please refer to evaluation for details):  ADL's;IADL's;Rest and Sleep;Play;Leisure;Social Participation    Rehab Potential  Fair    Current Impairments/barriers affecting progress:  6 wks s/p surgery with no therapy yet - flexor  contracture at PIP and very little at DIP     OT Frequency  2x / week    OT Duration  6 weeks    OT Treatment/Interventions  Self-care/ADL training;Therapeutic exercise;Patient/family education;Splinting;Paraffin;Fluidtherapy;Ultrasound;Contrast Bath;Manual Therapy;Passive range of motion;Scar mobilization    Plan  assess progress with HEP and use of splints and buddy strap  and results from surgeon visit    Clinical Decision Making  Multiple treatment options, significant modification of task necessary    OT Home Exercise Plan  see pt instruction    Consulted and Agree with Plan of Care  Patient       Patient will benefit from skilled therapeutic intervention in order to improve the following deficits and impairments:  Pain, Impaired flexibility, Increased edema, Decreased coordination, Decreased scar mobility, Impaired sensation, Decreased strength, Decreased range of motion, Impaired UE functional use  Visit Diagnosis: Pain in finger of both hands  Stiffness of right hand, not elsewhere classified  Stiffness of left hand, not elsewhere classified  Localized edema  Scar condition and fibrosis of skin  Muscle weakness (generalized)    Problem List Patient Active Problem List   Diagnosis Date Noted  . Encounter for gynecological examination without abnormal finding 04/06/2015  . Mixed incontinence 04/06/2015  . 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 Bursitis of shoulder 12/29/2014  . Tendinitis of right shoulder 12/29/2014  . Other shoulder lesions, right shoulder 12/29/2014  . Episode of syncope 05/11/2014  . Atrial flutter (HCC) 09/05/2012  . CFIDS (chronic fatigue and immune dysfunction syndrome) (HCC) 09/05/2012  . 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 Postural orthostatic tachycardia syndrome 08/30/2011  . Fast heart beat 08/30/2011  . Supraventricular arrhythmia 05/28/2011  . UNSPECIFIED MYOCARDITIS 07/20/2010  . SINUS TACHYCARDIA 07/18/2010  . DYSAUTONOMIA  07/18/2010  . PALPITATIONS, RECURRENT 07/18/2010  . DYSPNEA ON EXERTION 07/18/2010  . 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    Oletta Cohn OTR/L,CLT 02/21/2018, 5:38 PM  [facility] PHYSICAL AND SPORTS MEDICINE [address], [place], [address] Phone: [phone]   Fax:  [phone]  Name: Lisa Clay MRN: [id number] Date of Birth: July 14, 1970

## 2018-02-21 NOTE — Patient Instructions (Signed)
Same HEP as last time - but try neoprene - Banana splint on for 5th  And add rolling putty for extention of digits on R

## 2018-02-27 ENCOUNTER — Ambulatory Visit: Payer: Medicaid Other | Admitting: Occupational Therapy

## 2018-02-27 DIAGNOSIS — R600 Localized edema: Secondary | ICD-10-CM

## 2018-02-27 DIAGNOSIS — M25642 Stiffness of left hand, not elsewhere classified: Secondary | ICD-10-CM

## 2018-02-27 DIAGNOSIS — M79644 Pain in right finger(s): Secondary | ICD-10-CM

## 2018-02-27 DIAGNOSIS — M79645 Pain in left finger(s): Secondary | ICD-10-CM | POA: Diagnosis not present

## 2018-02-27 DIAGNOSIS — M25641 Stiffness of right hand, not elsewhere classified: Secondary | ICD-10-CM

## 2018-02-27 DIAGNOSIS — L905 Scar conditions and fibrosis of skin: Secondary | ICD-10-CM

## 2018-02-27 DIAGNOSIS — M6281 Muscle weakness (generalized): Secondary | ICD-10-CM

## 2018-02-27 NOTE — Therapy (Signed)
[facility] [address] [place], [place], [address] Phone: [phone]   Fax:  [phone]  Occupational Therapy Treatment  Patient Details  Name: Lisa SaucierCarly Melissa Yohannes MRN: [id number] Date of Birth: 1970-11-22 Referring Provider: Melvyn Novasrtmann   Encounter Date: 02/27/2018  OT End of Session - 02/27/18 1956    Visit Number  4    Number of Visits  16    Date for OT Re-Evaluation  05/01/18    OT Start Time  0905    OT Stop Time  1000    OT Time Calculation (min)  55 min    Activity Tolerance  Patient tolerated treatment well;Patient limited by pain    Behavior During Therapy  [facility] for tasks assessed/performed       Past Medical History:  Diagnosis Date  . Atrial tachycardia (HCC)   . Dysrhythmia    Hx: PSVT. Current: Postural Orthostatic Tachycardia Syndrome, Inappropriate Sinus Node Tachycardia  . Headache    migraines, rare.  . Kidney stones   . [facility]  . Shortness of breath dyspnea    on exertion, secondary to heart issues  . Sinus tachycardia    [facility]  . Syncope    secondary to heart issues    Past Surgical History:  Procedure Laterality Date  . BREAST BIOPSY Left 05/11/2013   stereo biopsy  . CARDIAC ELECTROPHYSIOLOGY STUDY AND ABLATION  04/24/11   [facility]  . CLOSED MANIPULATION SHOULDER WITH STERIOD INJECTION Right 02/16/2015   Procedure: CLOSED MANIPULATION SHOULDER WITH STEROID INJECTION;  Surgeon: Christena FlakeJohn J Poggi, MD;  [facility];  Service: Orthopedics;  Laterality: Right;  . FINGER SURGERY Right 12/25/2017   explorative surgery on R5th PIP   . TONSILLECTOMY      There were no vitals filed for this visit.  Subjective Assessment - 02/27/18 1951    Subjective   I seen Dr Melvyn Novasrtmann - he did not say to much - I told him I am on methotrexate - said to cont and will see him again in 4-6 wks     Patient Stated Goals  I just want the pain , swelling  better and the use of my hands - and to know what causing it     Currently in Pain?  Yes    Pain Score  4     Pain Location  Finger (Comment which one)    Pain Orientation  Right;Left    Pain Descriptors / Indicators  Aching;Tightness;Throbbing;Tender    Pain Type  Surgical pain    Pain Onset  More than a month ago    Pain Frequency  Constant    Aggravating Factors   with moving       Edema at PIP of 5th increase by 1 cm now and then PIP at L 4th decrease by 0.3 cm   [facility] OT Assessment - 02/27/18 0001      Right Hand AROM   R Little  MCP 0-90  92 Degrees    R Little PIP 0-100  --   -60 to flexion 86     Pt show now increase AROM at PIP of R 5th -and 26 degrees - was 10 degrees  Pt still cannot tolerate nepreone banana extention splint   fabricated this date custom guttter extention splint for PIP to R hand - to use at night time and some during day for few hours - for long prolonged  stretch  Scar appear to move better  And improve in tolerate massage -soft and soft textures - but not rougher textures and tapping          OT Treatments/Exercises (OP) - 02/27/18 0001      RUE Paraffin   Number Minutes Paraffin  10 Minutes    RUE Paraffin Location  Hand    Comments  LMB splint on PIP on R for extention of 5th       LUE Contrast Bath   Time  11 minutes    Comments  decrease edema and increase ROM         used on R hand LMB splint for 5th PIP exention - in paraffin - and tolerated okay  Contrast this date on L  AROM at PIP of L 4th improve to 90 degrees   was 75   Soft tissue mobs to lat bands , graston tool nr 2 for sweeping on volar hand andand forearm and brushing on volar digits 5th on R and 4th on L  - done vibration too PROM for PIP extention - with joint mobs and then DIP flexion attempted  Tendon glides -PROM and AAROM - block MC from hyper extention      OT Education - 02/27/18 1955    Education Details  spling wearing and progress     Person(s)  Educated  Patient    Methods  Explanation;Demonstration;Handout    Comprehension  Returned demonstration;Verbalized understanding       OT Short Term Goals - 02/27/18 1957      OT SHORT TERM GOAL #1   Title  Pt to be independent in HEP to decrease edema, pain , scar tissue and increase ROM in R 5th digit    Baseline  pain improve from  10/10  to 4-5 /10 at rest - but still increase to 8/10 , edema 1.5 cm increase at PIP of 5th , improve to 1 cm increase  scar tissue adhesion improving  , improve from 10 degrees of active motion to 26  degrees     Time  3    Period  Weeks    Status  On-going    Target Date  03/20/18      OT SHORT TERM GOAL #2   Title  Pain on PRWHE improve with more than 15 points     Baseline  PRWHE for pain at eval 43/50 and hyper sensitivie to everything including sheet, air at eval - can tolerate massage and soft textures     Time  3    Period  Weeks    Status  On-going    Target Date  03/20/18        OT Long Term Goals - 02/27/18 1959      OT LONG TERM GOAL #1   Title  R 5th AROM PIP and DIP increase with more than 30 degrees to turn doorknob     Baseline  was  10 degrees AROM at PIP( 80 flexion and extention -70 degrees)  and now 86 flexion and -60   and DIP flexion 10 degrees  still same wiht increase pain with any PROM     Time  6    Period  Weeks    Status  On-going    Target Date  04/03/18      OT LONG TERM GOAL #2   Title  Function score on PRWHE improve for pt to use hand more than 25 points  Baseline  at eval PRWHE function score was 20/50 - and still favoring because of pain and and stiffness - and 1 cm increase edema     Time  6    Period  Weeks    Status  On-going    Target Date  04/03/18            Plan - 02/27/18 1956    Clinical Impression Statement  Pt was seen for 3 visits- and showed decrease pain from 3-8/10 - rest can be now 3-4/10 but increase to 8/10 with ROM and use - cont to have 1 cm edema at PIP -and can tolerate  now soft textures and massgae - but not rougher texture , tapping - pt show AROM at PIP of 26 degrees compare to 10 degrees at [facility] - pt can benefit from OT ( hand therapy) to R dominant hand -to decreas pain and edema and hyper sensitiivty and increase ROM , strength and functional use     Occupational performance deficits (Please refer to evaluation for details):  ADL's;IADL's;Rest and Sleep;Play;Leisure;Social Participation    Rehab Potential  Fair    Current Impairments/barriers affecting progress:  6 wks s/p surgery with no therapy yet - flexor contracture at PIP and very little at DIP     OT Frequency  2x / week    OT Duration  4 weeks    OT Treatment/Interventions  Self-care/ADL training;Therapeutic exercise;Patient/family education;Splinting;Paraffin;Fluidtherapy;Ultrasound;Contrast Bath;Manual Therapy;Passive range of motion;Scar mobilization    Plan  assess progress with HEP and use of splints and buddy strap      Clinical Decision Making  Multiple treatment options, significant modification of task necessary    OT Home Exercise Plan  see pt instruction    Consulted and Agree with Plan of Care  Patient       Patient will benefit from skilled therapeutic intervention in order to improve the following deficits and impairments:  Pain, Impaired flexibility, Increased edema, Decreased coordination, Decreased scar mobility, Impaired sensation, Decreased strength, Decreased range of motion, Impaired UE functional use  Visit Diagnosis: Pain in finger of both hands  Stiffness of right hand, not elsewhere classified  Stiffness of left hand, not elsewhere classified  Localized edema  Scar condition and fibrosis of skin  Muscle weakness (generalized)    Problem List Patient Active Problem List   Diagnosis Date Noted  . Encounter for gynecological examination without abnormal finding 04/06/2015  . Mixed incontinence 04/06/2015  . 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 Bursitis of shoulder  12/29/2014  . Tendinitis of right shoulder 12/29/2014  . Other shoulder lesions, right shoulder 12/29/2014  . Episode of syncope 05/11/2014  . Atrial flutter (HCC) 09/05/2012  . CFIDS (chronic fatigue and immune dysfunction syndrome) (HCC) 09/05/2012  . 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 Postural orthostatic tachycardia syndrome 08/30/2011  . Fast heart beat 08/30/2011  . Supraventricular arrhythmia 05/28/2011  . UNSPECIFIED MYOCARDITIS 07/20/2010  . SINUS TACHYCARDIA 07/18/2010  . DYSAUTONOMIA 07/18/2010  . PALPITATIONS, RECURRENT 07/18/2010  . DYSPNEA ON EXERTION 07/18/2010  . 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    Oletta Cohn OTR/L,CLT 02/27/2018, 8:06 PM  [facility] PHYSICAL AND SPORTS MEDICINE [address] [place], [address] Phone: [phone]   Fax:  [phone]  Name: Paulett Kaufhold MRN: [id number] Date of Birth: 04/01/1971

## 2018-02-27 NOTE — Patient Instructions (Signed)
Same HEP but fabricated custom extention gutter splint for 5th -PIP extention  Night time and some during day

## 2018-03-07 ENCOUNTER — Ambulatory Visit: Payer: Medicaid Other | Admitting: Occupational Therapy

## 2018-03-07 DIAGNOSIS — R600 Localized edema: Secondary | ICD-10-CM

## 2018-03-07 DIAGNOSIS — M6281 Muscle weakness (generalized): Secondary | ICD-10-CM

## 2018-03-07 DIAGNOSIS — M79644 Pain in right finger(s): Secondary | ICD-10-CM

## 2018-03-07 DIAGNOSIS — M25642 Stiffness of left hand, not elsewhere classified: Secondary | ICD-10-CM

## 2018-03-07 DIAGNOSIS — M25641 Stiffness of right hand, not elsewhere classified: Secondary | ICD-10-CM

## 2018-03-07 DIAGNOSIS — M79645 Pain in left finger(s): Secondary | ICD-10-CM | POA: Diagnosis not present

## 2018-03-07 DIAGNOSIS — L905 Scar conditions and fibrosis of skin: Secondary | ICD-10-CM

## 2018-03-07 NOTE — Patient Instructions (Addendum)
Cont same HEP  Work on extention - several times during day -rolling built up foam roll provided for spoons, forks, toothbrush

## 2018-03-07 NOTE — Therapy (Signed)
[facility] MEDICINE [address], [place], [address] Phone: [phone]   Fax:  [phone]  Occupational Therapy Treatment  Patient Details  Name: Lisa Clay MRN: [id number] Date of Birth: [birth year] No data recorded  Encounter Date: 03/07/2018  OT End of Session - 03/07/18 1506    Visit Number  5    Number of Visits  16    Date for OT Re-Evaluation  05/01/18    OT Start Time  0905    OT Stop Time  1010    OT Time Calculation (min)  65 min    Activity Tolerance  Patient tolerated treatment well;Patient limited by pain    Behavior During Therapy  [facility] for tasks assessed/performed       Past Medical History:  Diagnosis Date  . Atrial tachycardia (HCC)   . Dysrhythmia    Hx: PSVT. Current: Postural Orthostatic Tachycardia Syndrome, Inappropriate Sinus Node Tachycardia  . Headache    migraines, rare.  . Kidney stones   . Palpitations    [facility] 2/12  . Shortness of breath dyspnea    on exertion, secondary to heart issues  . Sinus tachycardia    [facility] 2/12  . Syncope    secondary to heart issues    Past Surgical History:  Procedure Laterality Date  . BREAST BIOPSY Left 05/11/2013   stereo biopsy  . CARDIAC ELECTROPHYSIOLOGY STUDY AND ABLATION  04/24/11   [facility]  . CLOSED MANIPULATION SHOULDER WITH STERIOD INJECTION Right 02/16/2015   Procedure: CLOSED MANIPULATION SHOULDER WITH STEROID INJECTION;  Surgeon: Christena Flake, MD;  Location: [facility];  Service: Orthopedics;  Laterality: Right;  . FINGER SURGERY Right 12/25/2017   explorative surgery on R5th PIP   . TONSILLECTOMY      There were no vitals filed for this visit.  Subjective Assessment - 03/07/18 1503    Subjective   The pain is about 4/10 except if I hit it  and then cannot really use my pinkie on R and 4th on L - the last few days they are just ache - taking my 4th methotrexate today - sometimes my pinkie looks  better and then sometimes it looks stilll bad     Patient Stated Goals  I just want the pain , swelling better and the use of my hands - and to know what causing it     Currently in Pain?  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 - cookies - review with her baking act -and how to modify it - it was stress reliever - builtup handles provided for gripping of utensils -  Avoid tight and sustained grips with R and L - but encourage some gripping and use of RR 5th  And can roll built up foam - for extention           OT Treatments/Exercises (OP) - 03/07/18 0001      RUE Paraffin   Number Minutes Paraffin  10 Minutes    RUE Paraffin Location  Hand    Comments  LMB splint on 4th PIP extention for with heatingpad over         used on R hand LMB splint for 5th PIP exention -in paraffin- and tolerated okay     Soft tissue mobs to lat bands , graston tool nr 2 for sweeping on volar hand andand forearm and brushing on volar digits 5th on R and 4th on L- done vibration too PROM for PIP extention - with joint mobs and then DIP flexion attempted  Tendon glides -PROM and AAROM - block MC from hyper extention Decrease extention and flexion this date at 5th  Pt was able to get banana splint on at home per pt for 15 min  And in clinic did 2 min   Pt to cont with same HEP  [place] at 3. 20 % done over 5th on R and L 4th - end of session - and 0.8 intensity  5 min        OT Education - 03/07/18 1506    Education Details  using hand in baking and using builtup handles for untencils - modifications     Person(s) Educated  Patient    Methods  Explanation;Demonstration;Handout    Comprehension   Returned demonstration;Verbalized understanding       OT Short Term Goals - 02/27/18 1957      OT SHORT TERM GOAL #1   Title  Pt to be independent in HEP to decrease edema, pain , scar tissue and increase ROM in R 5th digit    Baseline  pain improve from  10/10  to 4-5 /10 at rest - but still increase to 8/10 , edema 1.5 cm increase at PIP of 5th , improve to 1 cm increase  scar tissue adhesion improving  , improve from 10 degrees of active motion to 26  degrees     Time  3    Period  Weeks    Status  On-going    Target Date  03/20/18      OT SHORT TERM GOAL #2   Title  Pain on PRWHE improve with more than 15 points     Baseline  PRWHE for pain at eval 43/50 and hyper sensitivie to everything including sheet, air at eval - can tolerate massage and soft textures     Time  3    Period  Weeks    Status  On-going    Target Date  03/20/18        OT Long Term Goals - 02/27/18 1959      OT LONG TERM GOAL #1   Title  R 5th AROM PIP and DIP increase with more than 30 degrees to turn doorknob     Baseline  was  10 degrees AROM at PIP( 80 flexion and extention -70 degrees)  and now 86 flexion and -60   and DIP flexion 10 degrees  still same wiht increase pain with any PROM     Time  6    Period  Weeks    Status  On-going    Target Date  04/03/18      OT LONG TERM GOAL #2   Title  Function score on PRWHE improve for pt to use hand more than 25 points     Baseline  at eval PRWHE function score was 20/50 - and still favoring because of pain and and stiffness - and 1 cm increase edema     Time  6    Period  Weeks  Status  On-going    Target Date  04/03/18            Plan - 03/07/18 1507    Clinical Impression Statement  Pt this date present at eval extention at PIP of R 5th worse than last time- -65 and flexion 75- edema still 1 cm -and harder block or end range for extention this date- did improve some wiht paraffin - pt report the last week was little more sore - 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 - was able to review with her the act and tasks - and provided some builtup handles for spoons - only hard thing will be the piping of icing - pt to try small batch     Occupational performance deficits (Please refer to evaluation for details):  ADL's;IADL's;Rest and Sleep;Play;Leisure;Social Participation    Rehab Potential  Fair    Current Impairments/barriers affecting progress:  6 wks s/p surgery with no therapy yet - flexor contracture at PIP and very little at DIP     OT Frequency  2x / week    OT Duration  4 weeks    OT Treatment/Interventions  Self-care/ADL training;Therapeutic exercise;Patient/family education;Splinting;Paraffin;Fluidtherapy;Ultrasound;Contrast Bath;Manual Therapy;Passive range of motion;Scar mobilization    Plan  assess progress with HEP  and if try to do some baking and if she modified it     Clinical Decision Making  Multiple treatment options, significant modification of task necessary    OT Home Exercise Plan  see pt instruction    Consulted and Agree with Plan of Care  Patient       Patient will benefit from skilled therapeutic intervention in order to improve the following deficits and impairments:  Pain, Impaired flexibility, Increased edema, Decreased coordination, Decreased scar mobility, Impaired sensation, Decreased strength, Decreased range of motion, Impaired UE functional use  Visit Diagnosis: Pain in finger of both hands  Stiffness of right hand, not elsewhere classified  Stiffness of left hand, not elsewhere classified  Localized edema  Scar condition and fibrosis of skin  Muscle weakness (generalized)    Problem List Patient Active Problem List   Diagnosis Date Noted  . Encounter for gynecological examination without abnormal finding 04/06/2015  . Mixed incontinence 04/06/2015  . 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 Bursitis of shoulder 12/29/2014  . Tendinitis of right shoulder 12/29/2014   . Other shoulder lesions, right shoulder 12/29/2014  . Episode of syncope 05/11/2014  . Atrial flutter (HCC) 09/05/2012  . CFIDS (chronic fatigue and immune dysfunction syndrome) (HCC) 09/05/2012  . 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 Postural orthostatic tachycardia syndrome 08/30/2011  . Fast heart beat 08/30/2011  . Supraventricular arrhythmia 05/28/2011  . UNSPECIFIED MYOCARDITIS 07/20/2010  . SINUS TACHYCARDIA 07/18/2010  . DYSAUTONOMIA 07/18/2010  . PALPITATIONS, RECURRENT 07/18/2010  . DYSPNEA ON EXERTION 07/18/2010  . 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    Oletta Cohn OTR/l,CLT 03/07/2018, 3:12 PM  [facility] [facility] PH[facility]TS MEDICINE [address], [place], [address] Phone: [phone]   Fax:  [phone]  Name: Lisa Clay MRN: [id number] Date of Birth: 03/27/1971

## 2018-03-14 ENCOUNTER — Ambulatory Visit: Payer: Medicaid Other | Attending: Orthopedic Surgery | Admitting: Occupational Therapy

## 2018-03-14 DIAGNOSIS — M25642 Stiffness of left hand, not elsewhere classified: Secondary | ICD-10-CM | POA: Insufficient documentation

## 2018-03-14 DIAGNOSIS — L905 Scar conditions and fibrosis of skin: Secondary | ICD-10-CM | POA: Diagnosis present

## 2018-03-14 DIAGNOSIS — M6281 Muscle weakness (generalized): Secondary | ICD-10-CM | POA: Diagnosis present

## 2018-03-14 DIAGNOSIS — M25641 Stiffness of right hand, not elsewhere classified: Secondary | ICD-10-CM | POA: Diagnosis present

## 2018-03-14 DIAGNOSIS — M79644 Pain in right finger(s): Secondary | ICD-10-CM | POA: Diagnosis present

## 2018-03-14 DIAGNOSIS — M79645 Pain in left finger(s): Secondary | ICD-10-CM | POA: Insufficient documentation

## 2018-03-14 DIAGNOSIS — R600 Localized edema: Secondary | ICD-10-CM | POA: Diagnosis present

## 2018-03-14 NOTE — Therapy (Signed)
[facility] PH[facility]ICINE [address], [place], [address] Phone: [phone]   Fax:  [phone]  Occupational Therapy Treatment  Patient Details  Name: Lisa Clay MRN: [id number] Date of Birth: [birth year] No data recorded  Encounter Date: 03/14/2018  OT End of Session - 03/14/18 1837    Visit Number  6    Number of Visits  16    Date for OT Re-Evaluation  05/01/18    OT Start Time  1045    OT Stop Time  1140    OT Time Calculation (min)  55 min    Activity Tolerance  Patient tolerated treatment well;Patient limited by pain    Behavior During Therapy  [facility] for tasks assessed/performed       Past Medical History:  Diagnosis Date  . Atrial tachycardia (HCC)   . Dysrhythmia    Hx: PSVT. Current: Postural Orthostatic Tachycardia Syndrome, Inappropriate Sinus Node Tachycardia  . Headache    migraines, rare.  . Kidney stones   . Palpitations    [facility] 2/12  . Shortness of breath dyspnea    on exertion, secondary to heart issues  . Sinus tachycardia    [facility] 2/12  . Syncope    secondary to heart issues    Past Surgical History:  Procedure Laterality Date  . BREAST BIOPSY Left 05/11/2013   stereo biopsy  . CARDIAC ELECTROPHYSIOLOGY STUDY AND ABLATION  04/24/11   [facility]  . CLOSED MANIPULATION SHOULDER WITH STERIOD INJECTION Right 02/16/2015   Procedure: CLOSED MANIPULATION SHOULDER WITH STEROID INJECTION;  Surgeon: Christena Flake, MD;  Location: [facility];  Service: Orthopedics;  Laterality: Right;  . FINGER SURGERY Right 12/25/2017   explorative surgery on R5th PIP   . TONSILLECTOMY      There were no vitals filed for this visit.  Subjective Assessment - 03/14/18 1831    Subjective   MY pain is more in the ring finger on L and cannot straighten it as good- I was to afraid to try baking little- I tried to work on the pinkie as much - think it is little better  - but the ring  finger tip cannot bend as good anymore     Patient Stated Goals  I just want the pain , swelling better and the use of my hands - and to know what causing it     Currently in Pain?  Yes    Pain Score  3     Pain Location  Hand   fingers R pinkie worse , L 4th    Pain Orientation  Right;Left    Pain Descriptors / Indicators  Aching;Throbbing;Tightness;Stabbing;Shooting    Pain Type  Surgical pain    Pain Onset  More than a month ago    Pain Frequency  Constant         [facility] OT Assessment - 03/14/18 0001      Right Hand AROM   R Little  MCP 0-90  90 Degrees    R Little PIP 0-100  88 Degrees   -70 extention SOC- and end extention -60 and 86 flexion      Left Hand AROM   L Ring  MCP 0-90  75 Degrees    L Ring PIP 0-100  70 Degrees   -15        used on R hand LMB splint for 5th PIP exention -in paraffin- and tolerated okay     Soft  tissue mobs to lat bands , graston tool nr 2 for sweeping on volar hand andand forearm and brushing on volar digits 5th on R and 4th on L Tender over L 4th A1pulley  Pt to do ice - done ionto with dexamethazone on A1pulley at 4th while done paraffin to R hand  Small patch and 19 min , 2.0 current for pain and edema   PROM for PIP extention - with joint mobs and then DIP flexion attempted  Tendon glides -PROM and AAROM - block MC from hyper extention Decrease extention this date but increase PIP flexion of 5th   Pt was able to get banana splint on at home per pt for 15 min  Change her gutter splint to mold skin to whole inside and to use coban wrap around - to decrease pressure from velcro  To use  As much as she can  On R hand    gutter splint on 5th - to increase extention of PIP   use coban to fasten  Wear most all the time - off 5 x day or every 2 hrs to do ROM    L hand contrast  and avoid tight grip   tendon glides  3 xday  And several time during day do ice massage of 4th A1pulley     Pt to cont with same HEP         OT Treatments/Exercises (OP) - 03/14/18 0001      RUE Paraffin   Number Minutes Paraffin  10 Minutes    RUE Paraffin Location  Hand    Comments  LBM splint on for extention of PIP              OT Education - 03/14/18 1837    Education Details  splint wearing - and HEP changes     Person(s) Educated  Patient    Methods  Explanation;Demonstration;Handout    Comprehension  Returned demonstration;Verbalized understanding       OT Short Term Goals - 02/27/18 1957      OT SHORT TERM GOAL #1   Title  Pt to be independent in HEP to decrease edema, pain , scar tissue and increase ROM in R 5th digit    Baseline  pain improve from  10/10  to 4-5 /10 at rest - but still increase to 8/10 , edema 1.5 cm increase at PIP of 5th , improve to 1 cm increase  scar tissue adhesion improving  , improve from 10 degrees of active motion to 26  degrees     Time  3    Period  Weeks    Status  On-going    Target Date  03/20/18      OT SHORT TERM GOAL #2   Title  Pain on PRWHE improve with more than 15 points     Baseline  PRWHE for pain at eval 43/50 and hyper sensitivie to everything including sheet, air at eval - can tolerate massage and soft textures     Time  3    Period  Weeks    Status  On-going    Target Date  03/20/18        OT Long Term Goals - 02/27/18 1959      OT LONG TERM GOAL #1   Title  R 5th AROM PIP and DIP increase with more than 30 degrees to turn doorknob     Baseline  was  10 degrees AROM at PIP( 80 flexion and  extention -70 degrees)  and now 86 flexion and -60   and DIP flexion 10 degrees  still same wiht increase pain with any PROM     Time  6    Period  Weeks    Status  On-going    Target Date  04/03/18      OT LONG TERM GOAL #2   Title  Function score on PRWHE improve for pt to use hand more than 25 points     Baseline  at eval PRWHE function score was 20/50 - and still favoring because of pain and and stiffness - and 1 cm increase edema     Time   6    Period  Weeks    Status  On-going    Target Date  04/03/18            Plan - 03/14/18 1837    Clinical Impression Statement  Pt did not try and back - was afraid to try - she is perfectionest - pt report increase pain and decrease extention of L 4th -and R 5th felt like she had more motion - but she lost 10 degrees of extention this week - but gained it back in session - did increase flexion at PIP this past week to 88 degrees - pt to work on maintaining extention on PIP of 5th for longer times      Occupational performance deficits (Please refer to evaluation for details):  ADL's;IADL's;Rest and Sleep;Play;Leisure;Social Participation    Rehab Potential  Fair    Current Impairments/barriers affecting progress:  6 wks s/p surgery with no therapy yet - flexor contracture at PIP and very little at DIP     OT Frequency  --   1-2 x wk    OT Duration  4 weeks    OT Treatment/Interventions  Self-care/ADL training;Therapeutic exercise;Patient/family education;Splinting;Paraffin;Fluidtherapy;Ultrasound;Contrast Bath;Manual Therapy;Passive range of motion;Scar mobilization    Plan  assess progress with HEP   and splint use     Clinical Decision Making  Multiple treatment options, significant modification of task necessary    OT Home Exercise Plan  see pt instruction    Consulted and Agree with Plan of Care  Patient       Patient will benefit from skilled therapeutic intervention in order to improve the following deficits and impairments:  Pain, Impaired flexibility, Increased edema, Decreased coordination, Decreased scar mobility, Impaired sensation, Decreased strength, Decreased range of motion, Impaired UE functional use  Visit Diagnosis: Pain in finger of both hands  Stiffness of right hand, not elsewhere classified  Stiffness of left hand, not elsewhere classified  Localized edema  Scar condition and fibrosis of skin  Muscle weakness (generalized)    Problem List Patient  Active Problem List   Diagnosis Date Noted  . Encounter for gynecological examination without abnormal finding 04/06/2015  . Mixed incontinence 04/06/2015  . 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 Bursitis of shoulder 12/29/2014  . Tendinitis of right shoulder 12/29/2014  . Other shoulder lesions, right shoulder 12/29/2014  . Episode of syncope 05/11/2014  . Atrial flutter (HCC) 09/05/2012  . CFIDS (chronic fatigue and immune dysfunction syndrome) (HCC) 09/05/2012  . 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 Postural orthostatic tachycardia syndrome 08/30/2011  . Fast heart beat 08/30/2011  . Supraventricular arrhythmia 05/28/2011  . UNSPECIFIED MYOCARDITIS 07/20/2010  . SINUS TACHYCARDIA 07/18/2010  . DYSAUTONOMIA 07/18/2010  . PALPITATIONS, RECURRENT 07/18/2010  . DYSPNEA ON EXERTION 07/18/2010  . 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    Amaal Dimartino,  Shantana Christon OTR/L,CLT 03/14/2018, 6:41 PM  [place] [facility] PHYSICAL AND SPORTS MEDICINE [address] [place], [address] Phone: [phone]   Fax:  [phone]  Name: Lisa Clay MRN: [id number] Date of Birth: 08/22/1970

## 2018-03-14 NOTE — Patient Instructions (Addendum)
R hand   use gutter splint on 5th - to increase extention of PIP   use coban to fasten  Wear most all the time - off 5 x day or every 2 hrs to do ROM    L hand contrast  and avoid tight grip   tendon glides  3 xday  And several time during day do ice massage of 4th A1pulley

## 2018-03-20 ENCOUNTER — Ambulatory Visit: Payer: Medicaid Other | Admitting: Occupational Therapy

## 2018-03-25 ENCOUNTER — Ambulatory Visit: Payer: Medicaid Other | Admitting: Occupational Therapy

## 2018-03-25 DIAGNOSIS — M25642 Stiffness of left hand, not elsewhere classified: Secondary | ICD-10-CM

## 2018-03-25 DIAGNOSIS — L905 Scar conditions and fibrosis of skin: Secondary | ICD-10-CM

## 2018-03-25 DIAGNOSIS — M79644 Pain in right finger(s): Secondary | ICD-10-CM

## 2018-03-25 DIAGNOSIS — M6281 Muscle weakness (generalized): Secondary | ICD-10-CM

## 2018-03-25 DIAGNOSIS — M79645 Pain in left finger(s): Principal | ICD-10-CM

## 2018-03-25 DIAGNOSIS — R600 Localized edema: Secondary | ICD-10-CM

## 2018-03-25 DIAGNOSIS — M25641 Stiffness of right hand, not elsewhere classified: Secondary | ICD-10-CM

## 2018-03-25 NOTE — Patient Instructions (Signed)
Same HEP but need to wear extention splints as much as she can and work on flexion 5 x day

## 2018-03-25 NOTE — Therapy (Signed)
[facility] PHYS[facility]ORTS MEDICINE [address] [place], [address] Phone: [phone]   Fax:  [phone]  Occupational Therapy Treatment  Patient Details  Name: Lisa Clay MRN: [id number] Date of Birth: 29-Oct-1970 No data recorded  Encounter Date: 03/25/2018  OT End of Session - 03/25/18 1053    Visit Number  7    Number of Visits  16    Date for OT Re-Evaluation  05/01/18    OT Start Time  1000    OT Stop Time  1046    OT Time Calculation (min)  46 min    Activity Tolerance  Patient tolerated treatment well;Patient limited by pain    Behavior During Therapy  [facility] for tasks assessed/performed       Past Medical History:  Diagnosis Date  . Atrial tachycardia (HCC)   . Dysrhythmia    Hx: PSVT. Current: Postural Orthostatic Tachycardia Syndrome, Inappropriate Sinus Node Tachycardia  . Headache    migraines, rare.  . Kidney stones   . Palpitations    [facility] 2/12  . Shortness of breath dyspnea    on exertion, secondary to heart issues  . Sinus tachycardia    [facility] 2/12  . Syncope    secondary to heart issues    Past Surgical History:  Procedure Laterality Date  . BREAST BIOPSY Left 05/11/2013   stereo biopsy  . CARDIAC ELECTROPHYSIOLOGY STUDY AND ABLATION  04/24/11   [facility]  . CLOSED MANIPULATION SHOULDER WITH STERIOD INJECTION Right 02/16/2015   Procedure: CLOSED MANIPULATION SHOULDER WITH STEROID INJECTION;  Surgeon: Christena Flake, MD;  Location: [facility];  Service: Orthopedics;  Laterality: Right;  . FINGER SURGERY Right 12/25/2017   explorative surgery on R5th PIP   . TONSILLECTOMY      There were no vitals filed for this visit.  Subjective Assessment - 03/25/18 1017    Subjective   I can get my finger more straight - I can see inside the fold - but then it bends down again - but the R one is curling more and look bruised     Patient Stated Goals  I just want the pain ,  swelling better and the use of my hands - and to know what causing it     Currently in Pain?  Yes    Pain Score  4     Pain Location  Hand    Pain Orientation  Right;Left    Pain Descriptors / Indicators  Tightness;Throbbing;Aching;Sore    Pain Type  Surgical pain;Chronic pain    Pain Onset  More than a month ago    Pain Frequency  Constant         [facility] OT Assessment - 03/25/18 0001      Right Hand AROM   R Little  MCP 0-90  100 Degrees    R Little PIP 0-100  90 Degrees   -75 SOC -and PROM -65 prior to paraffin      Left Hand AROM   L Ring  MCP 0-90  90 Degrees    L Ring PIP 0-100  75 Degrees   -25       AROM take for bilateral 4th digit and R 5th  See flowsheet  pt loosing extention and flexion at L 4th  And R 4th DIP - increase pain  And then increase flexion contracture at R 5th PIP - decrease extention PROM and AROM - compare to [facility] -  increase flexion   hard block with PROM and stretches         OT Treatments/Exercises (OP) - 03/25/18 0001      RUE Paraffin   Number Minutes Paraffin  10 Minutes    RUE Paraffin Location  Hand    Comments  LMB splint on 5th PIP of R       LUE Paraffin   Number Minutes Paraffin  10 Minutes    LUE Paraffin Location  Hand    Comments  at [facility] to increase PIP extenton and increase ORM - decrease pain         used on R hand LMB splint for 5th PIP exention -in paraffin- and tolerated okay    Soft tissue mobs to lat bands , graston tool nr 2 for sweeping on volar hand and palms  and brushing on volar digits 5th on R and 4th on L Tender over L 4th A1pulley still - Appear bruised  Pt to do ice    PROM for PIP extention - with joint mobs and then DIP flexion attempted  On L 4th and R 5th and 4th  Tendon glides -PROM and AAROM - block MC from hyper extention Decrease extention this date but increase PIP flexion of 5th  again this date  Pt was able to get banana splint on at home per pt for 15 min  Change her  gutter splint to mold skin to whole inside and to use coban wrap around - to decrease pressure from velcro last week   pt to increase wearing of extention splints  On R hand    gutter splint on 5th - to increase extention of PIP   use coban to fasten  Wear most all the time - off 5 x day or every 2 hrs to do ROM   Pt to see Dr Gavin Potters this am     OT Education - 03/25/18 1052    Education Details  splint wearing    Person(s) Educated  Patient    Methods  Explanation;Demonstration    Comprehension  Returned demonstration       OT Short Term Goals - 02/27/18 1957      OT SHORT TERM GOAL #1   Title  Pt to be independent in HEP to decrease edema, pain , scar tissue and increase ROM in R 5th digit    Baseline  pain improve from  10/10  to 4-5 /10 at rest - but still increase to 8/10 , edema 1.5 cm increase at PIP of 5th , improve to 1 cm increase  scar tissue adhesion improving  , improve from 10 degrees of active motion to 26  degrees     Time  3    Period  Weeks    Status  On-going    Target Date  03/20/18      OT SHORT TERM GOAL #2   Title  Pain on PRWHE improve with more than 15 points     Baseline  PRWHE for pain at eval 43/50 and hyper sensitivie to everything including sheet, air at eval - can tolerate massage and soft textures     Time  3    Period  Weeks    Status  On-going    Target Date  03/20/18        OT Long Term Goals - 02/27/18 1959      OT LONG TERM GOAL #1   Title  R 5th AROM PIP and DIP  increase with more than 30 degrees to turn doorknob     Baseline  was  10 degrees AROM at PIP( 80 flexion and extention -70 degrees)  and now 86 flexion and -60   and DIP flexion 10 degrees  still same wiht increase pain with any PROM     Time  6    Period  Weeks    Status  On-going    Target Date  04/03/18      OT LONG TERM GOAL #2   Title  Function score on PRWHE improve for pt to use hand more than 25 points     Baseline  at eval PRWHE function score was 20/50 - and  still favoring because of pain and and stiffness - and 1 cm increase edema     Time  6    Period  Weeks    Status  On-going    Target Date  04/03/18            Plan - 03/25/18 1055    Clinical Impression Statement  Pt arrive with L 5th PIP increase flexion to 90 and extention -75 coming in and able to get to 60 degrees PROM and AROM -65 - pt use to be -45 at [facility] - pt having hard block with PROM and stretches - pt to see Dr Gavin Potters this am  - pt has decrease DIP flexion in R 4th  the last week or 2 and increase pain -as well as - increase pain , edema and decrease PIP extention and flexion at L 4th PIP  -     Occupational performance deficits (Please refer to evaluation for details):  ADL's;IADL's;Rest and Sleep;Play;Leisure;Social Participation    Rehab Potential  Fair    Current Impairments/barriers affecting progress:  6 wks s/p surgery started therapy - flexor contracture at PIP and very little at DIP     OT Frequency  --   1-2 x wk   OT Duration  4 weeks    OT Treatment/Interventions  Self-care/ADL training;Therapeutic exercise;Patient/family education;Splinting;Paraffin;Fluidtherapy;Ultrasound;Manual Therapy;Passive range of motion;Scar mobilization;Iontophoresis    Plan  assess progress with HEP   and splint use     Clinical Decision Making  Multiple treatment options, significant modification of task necessary    OT Home Exercise Plan  see pt instruction    Consulted and Agree with Plan of Care  Patient       Patient will benefit from skilled therapeutic intervention in order to improve the following deficits and impairments:  Pain, Impaired flexibility, Increased edema, Decreased coordination, Decreased scar mobility, Impaired sensation, Decreased strength, Decreased range of motion, Impaired UE functional use  Visit Diagnosis: Pain in finger of both hands  Stiffness of right hand, not elsewhere classified  Stiffness of left hand, not elsewhere classified  Localized  edema  Scar condition and fibrosis of skin  Muscle weakness (generalized)    Problem List Patient Active Problem List   Diagnosis Date Noted  . Encounter for gynecological examination without abnormal finding 04/06/2015  . Mixed incontinence 04/06/2015  . 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 Bursitis of shoulder 12/29/2014  . Tendinitis of right shoulder 12/29/2014  . Other shoulder lesions, right shoulder 12/29/2014  . Episode of syncope 05/11/2014  . Atrial flutter (HCC) 09/05/2012  . CFIDS (chronic fatigue and immune dysfunction syndrome) (HCC) 09/05/2012  . 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 Postural orthostatic tachycardia syndrome 08/30/2011  . Fast heart beat 08/30/2011  . Supraventricular arrhythmia 05/28/2011  . UNSPECIFIED  MYOCARDITIS 07/20/2010  . SINUS TACHYCARDIA 07/18/2010  . DYSAUTONOMIA 07/18/2010  . PALPITATIONS, RECURRENT 07/18/2010  . DYSPNEA ON EXERTION 07/18/2010  . 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    Oletta Cohn OTR/L,CLT 03/25/2018, 11:00 AM  [facility] PHYSICAL [facility] SPORTS MEDICINE [address] [place], [address] Phone: [phone]   Fax:  [phone]  Name: Lisa Clay MRN: [id number] Date of Birth: 09/15/1970

## 2018-04-03 ENCOUNTER — Ambulatory Visit: Payer: Medicaid Other | Admitting: Occupational Therapy

## 2018-04-03 DIAGNOSIS — M79644 Pain in right finger(s): Secondary | ICD-10-CM

## 2018-04-03 DIAGNOSIS — M79645 Pain in left finger(s): Principal | ICD-10-CM

## 2018-04-03 DIAGNOSIS — M6281 Muscle weakness (generalized): Secondary | ICD-10-CM

## 2018-04-03 DIAGNOSIS — L905 Scar conditions and fibrosis of skin: Secondary | ICD-10-CM

## 2018-04-03 DIAGNOSIS — M25642 Stiffness of left hand, not elsewhere classified: Secondary | ICD-10-CM

## 2018-04-03 DIAGNOSIS — R600 Localized edema: Secondary | ICD-10-CM

## 2018-04-03 DIAGNOSIS — M25641 Stiffness of right hand, not elsewhere classified: Secondary | ICD-10-CM

## 2018-04-03 NOTE — Therapy (Signed)
[facility] [address] [place], [address] Phone: [phone]   Fax:  [phone]  Occupational Therapy Treatment  Patient Details  Name: Lisa Clay MRN: [id number] Date of Birth: 1970-07-26 No data recorded  Encounter Date: 04/03/2018  OT End of Session - 04/03/18 1235    Visit Number  8    Number of Visits  16    Date for OT Re-Evaluation  05/01/18    OT Start Time  1109    OT Stop Time  1200    OT Time Calculation (min)  51 min    Activity Tolerance  Patient tolerated treatment well;Patient limited by pain    Behavior During Therapy  [facility] for tasks assessed/performed       Past Medical History:  Diagnosis Date  . Atrial tachycardia (HCC)   . Dysrhythmia    Hx: PSVT. Current: Postural Orthostatic Tachycardia Syndrome, Inappropriate Sinus Node Tachycardia  . Headache    migraines, rare.  . Kidney stones   . Palpitations    [facility] 2/12  . Shortness of breath dyspnea    on exertion, secondary to heart issues  . Sinus tachycardia    [facility] 2/12  . Syncope    secondary to heart issues    Past Surgical History:  Procedure Laterality Date  . BREAST BIOPSY Left 05/11/2013   stereo biopsy  . CARDIAC ELECTROPHYSIOLOGY STUDY AND ABLATION  04/24/11   [facility]  . CLOSED MANIPULATION SHOULDER WITH STERIOD INJECTION Right 02/16/2015   Procedure: CLOSED MANIPULATION SHOULDER WITH STEROID INJECTION;  Surgeon: Christena Flake, MD;  Location: [facility];  Service: Orthopedics;  Laterality: Right;  . FINGER SURGERY Right 12/25/2017   explorative surgery on R5th PIP   . TONSILLECTOMY      There were no vitals filed for this visit.  Subjective Assessment - 04/03/18 1232    Subjective   My L ring finger is getting worse - and the tendon - and my pinkie about the same -hurts when I hit it - using splint - but hard to tolerate and ring finger not bending as good anymore and more pain  - Dr Gavin Potters put me on  Humira now too - pick it up later today     Patient Stated Goals  I just want the pain , swelling better and the use of my hands - and to know what causing it     Currently in Pain?  Yes    Pain Score  4     Pain Location  Hand    Pain Orientation  Right;Left    Pain Descriptors / Indicators  Aching;Throbbing;Tightness    Pain Type  Surgical pain;Chronic pain    Pain Onset  More than a month ago    Pain Frequency  Constant         [facility] OT Assessment - 04/03/18 0001      Right Hand AROM   R Little  MCP 0-90  100 Degrees    R Little PIP 0-100  90 Degrees   -70 in session -65 and flexion 90     Left Hand AROM   L Ring  MCP 0-90  90 Degrees    L Ring PIP 0-100  75 Degrees   -30 end of session         AROM assess in bilateral hands - and LMB splint fitting and how to adjust       OT  Treatments/Exercises (OP) - 04/03/18 0001      RUE Paraffin   Number Minutes Paraffin  10 Minutes    RUE Paraffin Location  Hand    Comments  LMB splint on 5th to increase PIP       LUE Paraffin   Number Minutes Paraffin  10 Minutes    LUE Paraffin Location  Hand    Comments  LMB splint on 4th PIP to increase extention         AROM take for bilateral 4th digit and R 5th - as well as PROM  See flowsheet  pt loosing extention and flexion at L 4th -  And R 4th decrease  DIP - increase pain and edema - And then increase flexion contracture at R 5th PIP cont  - decrease extention PROM and AROM - compare to [facility] - increase flexion   hard block with PROM and stretches for R  5th   Joint mobs and soft tissue mobs and massage done in combination with stretches and ROM  - pt progress in session but loose it by end of session   pt to see hand surgeon next Friday for 2nd opinion  And starting Humira - starting today -  in combination with methotrexate       OT Education - 04/03/18 1235    Education Details  splint wearing    Person(s) Educated  Patient     Methods  Explanation;Demonstration    Comprehension  Verbalized understanding;Returned demonstration       OT Short Term Goals - 02/27/18 1957      OT SHORT TERM GOAL #1   Title  Pt to be independent in HEP to decrease edema, pain , scar tissue and increase ROM in R 5th digit    Baseline  pain improve from  10/10  to 4-5 /10 at rest - but still increase to 8/10 , edema 1.5 cm increase at PIP of 5th , improve to 1 cm increase  scar tissue adhesion improving  , improve from 10 degrees of active motion to 26  degrees     Time  3    Period  Weeks    Status  On-going    Target Date  03/20/18      OT SHORT TERM GOAL #2   Title  Pain on PRWHE improve with more than 15 points     Baseline  PRWHE for pain at eval 43/50 and hyper sensitivie to everything including sheet, air at eval - can tolerate massage and soft textures     Time  3    Period  Weeks    Status  On-going    Target Date  03/20/18        OT Long Term Goals - 02/27/18 1959      OT LONG TERM GOAL #1   Title  R 5th AROM PIP and DIP increase with more than 30 degrees to turn doorknob     Baseline  was  10 degrees AROM at PIP( 80 flexion and extention -70 degrees)  and now 86 flexion and -60   and DIP flexion 10 degrees  still same wiht increase pain with any PROM     Time  6    Period  Weeks    Status  On-going    Target Date  04/03/18      OT LONG TERM GOAL #2   Title  Function score on PRWHE improve for pt to use hand more than  25 points     Baseline  at eval PRWHE function score was 20/50 - and still favoring because of pain and and stiffness - and 1 cm increase edema     Time  6    Period  Weeks    Status  On-going    Target Date  04/03/18            Plan - 04/03/18 1235    Clinical Impression Statement  Pt arrive with increase flexor contracture on L 4th PIP - and pain - as well as R 4th flexion decreasing - R 5th PIP about the same the last 2 wks - with extention -70 and flexion 90 degrees - pt to see Hand  surgeon Dr Mercy Riding next Friday -and Dr Nance Pear starting her on HUmira too in combination with Metraxate     Occupational performance deficits (Please refer to evaluation for details):  ADL's;IADL's;Rest and Sleep;Play;Leisure;Social Participation    Rehab Potential  Fair    Current Impairments/barriers affecting progress:  6 wks s/p surgery started therapy - flexor contracture at PIP and very little at DIP     OT Frequency  1x / week    OT Duration  4 weeks    OT Treatment/Interventions  Self-care/ADL training;Therapeutic exercise;Patient/family education;Splinting;Paraffin;Fluidtherapy;Ultrasound;Manual Therapy;Passive range of motion;Scar mobilization;Iontophoresis    Plan  Pt to call me after her appt with hand surgeon     Clinical Decision Making  Multiple treatment options, significant modification of task necessary    OT Home Exercise Plan  see pt instruction    Consulted and Agree with Plan of Care  Patient       Patient will benefit from skilled therapeutic intervention in order to improve the following deficits and impairments:  Pain, Impaired flexibility, Increased edema, Decreased coordination, Decreased scar mobility, Impaired sensation, Decreased strength, Decreased range of motion, Impaired UE functional use  Visit Diagnosis: Pain in finger of both hands  Stiffness of right hand, not elsewhere classified  Stiffness of left hand, not elsewhere classified  Localized edema  Scar condition and fibrosis of skin  Muscle weakness (generalized)    Problem List Patient Active Problem List   Diagnosis Date Noted  . Encounter for gynecological examination without abnormal finding 04/06/2015  . Mixed incontinence 04/06/2015  . 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 Bursitis of shoulder 12/29/2014  . Tendinitis of right shoulder 12/29/2014  . Other shoulder lesions, right shoulder 12/29/2014  . Episode of syncope 05/11/2014  . Atrial flutter (HCC) 09/05/2012  . CFIDS  (chronic fatigue and immune dysfunction syndrome) (HCC) 09/05/2012  . 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 Postural orthostatic tachycardia syndrome 08/30/2011  . Fast heart beat 08/30/2011  . Supraventricular arrhythmia 05/28/2011  . UNSPECIFIED MYOCARDITIS 07/20/2010  . SINUS TACHYCARDIA 07/18/2010  . DYSAUTONOMIA 07/18/2010  . PALPITATIONS, RECURRENT 07/18/2010  . DYSPNEA ON EXERTION 07/18/2010  . 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    Oletta Cohn OTR/L,CLT  04/03/2018, 12:38 PM  [facility] [address] [place], [address] Phone: [phone]   Fax:  [phone]  Name: Lisa Clay MRN: [id number] Date of Birth: 09-08-1970

## 2018-04-03 NOTE — Patient Instructions (Signed)
Cont same splint wearing what she can tolerate  And soft tissue mobs and ROM

## 2018-04-21 ENCOUNTER — Ambulatory Visit: Payer: Medicaid Other | Attending: Orthopedic Surgery | Admitting: Occupational Therapy

## 2018-04-21 DIAGNOSIS — M25641 Stiffness of right hand, not elsewhere classified: Secondary | ICD-10-CM | POA: Insufficient documentation

## 2018-04-21 DIAGNOSIS — M25642 Stiffness of left hand, not elsewhere classified: Secondary | ICD-10-CM | POA: Insufficient documentation

## 2018-04-21 DIAGNOSIS — M79645 Pain in left finger(s): Secondary | ICD-10-CM | POA: Insufficient documentation

## 2018-04-21 DIAGNOSIS — L905 Scar conditions and fibrosis of skin: Secondary | ICD-10-CM | POA: Insufficient documentation

## 2018-04-21 DIAGNOSIS — M79644 Pain in right finger(s): Secondary | ICD-10-CM | POA: Insufficient documentation

## 2018-04-21 DIAGNOSIS — M6281 Muscle weakness (generalized): Secondary | ICD-10-CM | POA: Insufficient documentation

## 2018-04-21 NOTE — Therapy (Signed)
[facility] [address] [place], [address] Phone: [phone]   Fax:  [phone]  Occupational Therapy Evaluation  Patient Details  Name: Lisa Clay MRN: [id number] Date of Birth: [birth year] No data recorded  Encounter Date: 04/21/2018    Past Medical History:  Diagnosis Date  . Atrial tachycardia (HCC)   . Dysrhythmia    Hx: PSVT. Current: Postural Orthostatic Tachycardia Syndrome, Inappropriate Sinus Node Tachycardia  . Headache    migraines, rare.  . Kidney stones   . Palpitations    [facility] 2/12  . Shortness of breath dyspnea    on exertion, secondary to heart issues  . Sinus tachycardia    [facility] 2/12  . Syncope    secondary to heart issues    Past Surgical History:  Procedure Laterality Date  . BREAST BIOPSY Left 05/11/2013   stereo biopsy  . CARDIAC ELECTROPHYSIOLOGY STUDY AND ABLATION  04/24/11   [facility]  . CLOSED MANIPULATION SHOULDER WITH STERIOD INJECTION Right 02/16/2015   Procedure: CLOSED MANIPULATION SHOULDER WITH STEROID INJECTION;  Surgeon: Christena Flake, MD;  Location: [facility];  Service: Orthopedics;  Laterality: Right;  . FINGER SURGERY Right 12/25/2017   explorative surgery on R5th PIP   . TONSILLECTOMY      There were no vitals filed for this visit.  Subjective Assessment - 04/21/18 1731    Subjective   I seen Dr Peggye Pitt - he recommend seeing me in 6 wks again and for me to get serial cast on 5th - I went to their therapy - they did cast but it twisted , and I have 2 splints to use on the L ring finger - I want to come and see you - because you're closer and know my case     Patient Stated Goals  I just want the pain , swelling and motion  better and the use of my hands - and to know what causing it     Currently in Pain?  Yes    Pain Score  6     Pain Location  Hand    Pain Orientation  Right;Left    Pain Descriptors / Indicators   Aching;Sore    Pain Type  Chronic pain;Surgical pain    Pain Onset  More than a month ago    Pain Frequency  Constant    Aggravating Factors   my whole body - or my joints today and with PROM increase to 10/10       Pt seen DR Gerlene Burdock for 2nd opinion - refer to [facility] OT that made her last week serial cast for 5th PIP on R hand - pt called past Friday that cast bothered her and she twisted it - and not working - wanted to transfer her OT back to this OT and cont here closer to home.  She removed cast on Sat - she did contact MD and they fax order - but it did not get thru. Pt and secretary trying to get order again. In the meantime - did quick cast on R 5th PIP - with layer of paraffin inside - for pt to wear for week - cont with same HEP they gave for her at [facility] on L 4th PIP  Pt ed on precautions for serial cast - appear to hold pt and edges smooth   pt schedule for eval in week and to redo cast  Patient will benefit from skilled therapeutic intervention in order to improve the following deficits and impairments:     Visit Diagnosis: Stiffness of right hand, not elsewhere classified  Stiffness of left hand, not elsewhere classified  Scar condition and fibrosis of skin    Problem List Patient Active Problem List   Diagnosis Date Noted  . Encounter for gynecological examination without abnormal finding 04/06/2015  . Mixed incontinence 04/06/2015  . 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 Bursitis of shoulder 12/29/2014  . Tendinitis of right shoulder 12/29/2014  . Other shoulder lesions, right shoulder 12/29/2014  . Episode of syncope 05/11/2014  . Atrial flutter (HCC) 09/05/2012  . CFIDS (chronic fatigue and immune dysfunction syndrome) (HCC) 09/05/2012  . 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 Postural orthostatic tachycardia syndrome 08/30/2011  . Fast heart beat 08/30/2011  . Supraventricular arrhythmia 05/28/2011  . UNSPECIFIED  MYOCARDITIS 07/20/2010  . SINUS TACHYCARDIA 07/18/2010  . DYSAUTONOMIA 07/18/2010  . PALPITATIONS, RECURRENT 07/18/2010  . DYSPNEA ON EXERTION 07/18/2010  . 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    Oletta Cohn OTR/L,CLT 04/21/2018, 5:37 PM  [facility] SPORTS MEDICINE [address] [place], [address] Phone: [phone]   Fax:  [phone]  Name: Kevionna Heffler MRN: [id number] Date of Birth: [birth year]

## 2018-04-21 NOTE — Patient Instructions (Signed)
Serial cast on R 5th PIP - new one made this date- her other one from [facility] come off over the weekend- cause pain on dorsal PIP  Then cont with joint jack on 4th on L and splint night time - per OT order at [facility]   Pt to get order from MD  And contact Therapy to transfer services here and discharge out of medicaid

## 2018-04-28 ENCOUNTER — Encounter: Payer: Self-pay | Admitting: Occupational Therapy

## 2018-04-28 ENCOUNTER — Ambulatory Visit: Payer: Medicaid Other | Admitting: Occupational Therapy

## 2018-04-28 ENCOUNTER — Other Ambulatory Visit: Payer: Self-pay

## 2018-04-28 DIAGNOSIS — L905 Scar conditions and fibrosis of skin: Secondary | ICD-10-CM | POA: Diagnosis present

## 2018-04-28 DIAGNOSIS — M6281 Muscle weakness (generalized): Secondary | ICD-10-CM | POA: Diagnosis present

## 2018-04-28 DIAGNOSIS — M79644 Pain in right finger(s): Secondary | ICD-10-CM

## 2018-04-28 DIAGNOSIS — M25642 Stiffness of left hand, not elsewhere classified: Secondary | ICD-10-CM

## 2018-04-28 DIAGNOSIS — M79645 Pain in left finger(s): Secondary | ICD-10-CM

## 2018-04-28 DIAGNOSIS — M25641 Stiffness of right hand, not elsewhere classified: Secondary | ICD-10-CM | POA: Diagnosis present

## 2018-04-28 NOTE — Patient Instructions (Signed)
Serial cast R 5th PIP at 70 degrees  Pt ed on caring and wearing of it  And cont with joint jack splint on L 4th PIP

## 2018-04-28 NOTE — Therapy (Signed)
[facility] [address] [place], [place], [address] Phone: [phone]   Fax:  [phone]  Occupational Therapy Evaluation  Patient Details  Name: Lisa SaucierCarly Melissa Bouffard MRN: [id number] Date of Birth: 06/15/1970 Referring Provider (OT): Grant Fontanamarc Richards   Encounter Date: 04/28/2018  OT End of Session - 04/28/18 1942    Visit Number  1    Number of Visits  6    Date for OT Re-Evaluation  06/09/18    OT Start Time  1050    OT Stop Time  1135    OT Time Calculation (min)  45 min    Activity Tolerance  Patient tolerated treatment well    Behavior During Therapy  [facility] for tasks assessed/performed       Past Medical History:  Diagnosis Date  . Atrial tachycardia (HCC)   . Dysrhythmia    Hx: PSVT. Current: Postural Orthostatic Tachycardia Syndrome, Inappropriate Sinus Node Tachycardia  . Headache    migraines, rare.  . Kidney stones   . Palpitations    [facility] 2/12  . Shortness of breath dyspnea    on exertion, secondary to heart issues  . Sinus tachycardia    [facility]  . Syncope    secondary to heart issues    Past Surgical History:  Procedure Laterality Date  . BREAST BIOPSY Left 05/11/2013   stereo biopsy  . CARDIAC ELECTROPHYSIOLOGY STUDY AND ABLATION  04/24/11   [facility]  . CLOSED MANIPULATION SHOULDER WITH STERIOD INJECTION Right 02/16/2015   Procedure: CLOSED MANIPULATION SHOULDER WITH STEROID INJECTION;  Surgeon: Christena FlakeJohn J Poggi, MD;  [facility];  Service: Orthopedics;  Laterality: Right;  . FINGER SURGERY Right 12/25/2017   explorative surgery on R5th PIP   . TONSILLECTOMY      There were no vitals filed for this visit.  Subjective Assessment - 04/28/18 1933    Subjective   I seen Dr Peggye Pittichards and he recommended for me to do serial casting for 6 wks to increase extention of R 5th PIP - while the rheumatholigst are working getting the right meds for my arthritis - I am  using the joint jack splint on the L 4th digits few times during the day for extention of PIP  that [facility] me after I seen Dr Peggye Pittichards - but I wanted the serial cast to be done by you - you know me and  you are closer     Patient Stated Goals  I just want the pain , swelling and motion  better and the use of my hands - and to know what causing it     Currently in Pain?  Yes    Pain Score  8     Pain Location  Hand    Pain Descriptors / Indicators  Aching;Sore    Pain Type  Chronic pain;Surgical pain    Pain Onset  More than a month ago    Pain Frequency  Constant        [facility] OT Assessment - 04/28/18 0001      Assessment   Medical Diagnosis  R 5th PIP explorative surgery     Referring Provider (OT)  marc Richards    Onset Date/Surgical Date  12/24/17    Hand Dominance  Right    Next MD Visit  --   middle /end of Dec   Prior Therapy  --   8/29 to 10/24 under surgeon  Home  Environment   Lives With  --   Alone and son     Prior Function   Vocation  On disability    Leisure  Was ER nurse, likes to bake, 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, house work , fish with her son ,      Right Hand AROM   R Little  MCP 0-90  90 Degrees    R Little PIP 0-100  100 Degrees   -80 PROM -70     Left Hand AROM   L Ring  MCP 0-90  90 Degrees    L Ring PIP 0-100  85 Degrees   -32   L Ring DIP 0-70  30 Degrees          Done paraffin to bilateral hands - with LMB splint on R 5th PIP to increase extention prior to casting Afterwards pt wore joint jack on L 4th PIP - for 10 min   Serial cast done on R 5th PIP - using orthocast - pt ed on precautions  Pt verbalize understanding - and to keep it dry             OT Education - 04/28/18 1942    Education Details  cast wearing    Person(s) Educated  Patient    Methods  Explanation;Demonstration    Comprehension  Verbalized understanding;Returned demonstration       OT Short Term Goals - 04/28/18 1950      OT SHORT  TERM GOAL #1   Title  Pt to be independent in HEP to tolerate serial cast to increase extention on R 5th PIP ,and using splint and ROM to increase ROM in L 4th PIP     Baseline  pain and edema at [facility] to increased - pt could not tolerate any splint , strap or cast on PIP of 5th and scar     Time  3    Period  Weeks    Status  New    Target Date  05/19/18      OT SHORT TERM GOAL #2   Title  Pain on PRWHE improve with more than 10  points     Baseline  pain on Aug and Sept was 43/50 on PRWHE  and now 27/50 -      Time  4    Period  Weeks    Status  New    Target Date  05/26/18        OT Long Term Goals - 04/28/18 1953      OT LONG TERM GOAL #1   Title  R 5th PIP extention  increase with more than 25 degrees to wash face, get gloves on and hand in pocket    Baseline  flexion at PIP 100 , extention AROM -80 and PROM -75     Time  6    Period  Weeks    Status  New    Target Date  06/09/18      OT LONG TERM GOAL #2   Title  Function score on PRWHE improve for pt to use hand more than 10 points     Baseline  at eval PRWHE function score was 17 06/12/48  - to carry more than 8 lbs, hold broom , get gloves on    Time  6    Period  Weeks    Status  New    Target Date  06/09/18  Plan - 04/28/18 1942    Clinical Impression Statement  Pt now 4 months out from R 5th PIP explorative surgery  - pt was seen by this OT for 8 sessions from 8/29 to 10/24 - pain, edema and scar improved but flexion contracture increased in R  5th PIP - pt cont to show increase edema , pain and redness in R 4th and L 4th digits - with increase flexor contracture on L 4th too - pt arthritis med was change to Humira - taking this week her 3rd shot - and was seen by ortho at [facility] and  or for OT to serial cast R 5th PIP for extention 1 x wk for 6 wks - pt flexion at PIP improve to 100 , but extention -80  and PROM -75 degrees - pt can benefit from OT services for increase ROM in digits , decrease pain and  increase functional use     Occupational performance deficits (Please refer to evaluation for details):  ADL's;IADL's;Rest and Sleep;Play;Leisure;Social Participation    Rehab Potential  Fair    Current Impairments/barriers affecting progress:  Pt 4 wks s/p - flexion contracture increasing over months - had pain in beginning could not do serial cast -     OT Frequency  1x / week    OT Duration  6 weeks    OT Treatment/Interventions  Self-care/ADL training;Therapeutic exercise;Patient/family education;Splinting;Paraffin;Manual Therapy;Passive range of motion;Scar mobilization    Plan  assess PIP extention on 5th R - and recast with increase extention if possible     Clinical Decision Making  Several treatment options, min-mod task modification necessary    OT Home Exercise Plan  see pt instruction    Consulted and Agree with Plan of Care  Patient       Patient will benefit from skilled therapeutic intervention in order to improve the following deficits and impairments:  Pain, Impaired flexibility, Decreased coordination, Decreased strength, Decreased range of motion, Impaired UE functional use  Visit Diagnosis: Stiffness of right hand, not elsewhere classified - Plan: Ot plan of care cert/re-cert  Stiffness of left hand, not elsewhere classified - Plan: Ot plan of care cert/re-cert  Scar condition and fibrosis of skin - Plan: Ot plan of care cert/re-cert  Pain in finger of both hands - Plan: Ot plan of care cert/re-cert  Muscle weakness (generalized) - Plan: Ot plan of care cert/re-cert    Problem List Patient Active Problem List   Diagnosis Date Noted  . Encounter for gynecological examination without abnormal finding 04/06/2015  . Mixed incontinence 04/06/2015  . 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 Bursitis of shoulder 12/29/2014  . Tendinitis of right shoulder 12/29/2014  . Other shoulder lesions, right shoulder 12/29/2014  . Episode of syncope 05/11/2014  . Atrial flutter  (HCC) 09/05/2012  . CFIDS (chronic fatigue and immune dysfunction syndrome) (HCC) 09/05/2012  . 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 Postural orthostatic tachycardia syndrome 08/30/2011  . Fast heart beat 08/30/2011  . Supraventricular arrhythmia 05/28/2011  . UNSPECIFIED MYOCARDITIS 07/20/2010  . SINUS TACHYCARDIA 07/18/2010  . DYSAUTONOMIA 07/18/2010  . PALPITATIONS, RECURRENT 07/18/2010  . DYSPNEA ON EXERTION 07/18/2010  . 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    Oletta Cohn OTR/L,CLT 04/28/2018, 8:00 PM  [facility] [address], [place], [address] Phone: [phone]   Fax:  [phone]  Name: Anniyah Mood MRN: [id number] Date of Birth: September 11, 1970

## 2018-05-05 ENCOUNTER — Ambulatory Visit: Payer: Medicaid Other | Admitting: Occupational Therapy

## 2018-05-05 DIAGNOSIS — M25641 Stiffness of right hand, not elsewhere classified: Secondary | ICD-10-CM

## 2018-05-05 NOTE — Patient Instructions (Signed)
Same

## 2018-05-05 NOTE — Therapy (Signed)
[facility] PHYSICAL AND SPORTS MEDICINE [address] [place], [place], [address] Phone: [phone]   Fax:  [phone]  Occupational Therapy Treatment  Patient Details  Name: Lisa SaucierCarly Melissa Clay MRN: [id number] Date of Birth: [birth year] Referring Provider (OT): Grant Fontanamarc Richards   Encounter Date: 05/05/2018    Past Medical History:  Diagnosis Date  . Atrial tachycardia (HCC)   . Dysrhythmia    Hx: PSVT. Current: Postural Orthostatic Tachycardia Syndrome, Inappropriate Sinus Node Tachycardia  . Headache    migraines, rare.  . Kidney stones   . Palpitations    [facility] 2/12  . Shortness of breath dyspnea    on exertion, secondary to heart issues  . Sinus tachycardia    [facility]  . Syncope    secondary to heart issues    Past Surgical History:  Procedure Laterality Date  . BREAST BIOPSY Left 05/11/2013   stereo biopsy  . CARDIAC ELECTROPHYSIOLOGY STUDY AND ABLATION  04/24/11   [facility]  . CLOSED MANIPULATION SHOULDER WITH STERIOD INJECTION Right 02/16/2015   Procedure: CLOSED MANIPULATION SHOULDER WITH STEROID INJECTION;  Surgeon: Christena FlakeJohn J Poggi, MD;  [facility];  Service: Orthopedics;  Laterality: Right;  . FINGER SURGERY Right 12/25/2017   explorative surgery on R5th PIP   . TONSILLECTOMY      There were no vitals filed for this visit.  Subjective Assessment - 05/05/18 0937    Subjective   My cast stayed on until about yesterday and the fold was more open - the blue cast done better- I think it is more open my finger    Patient Stated Goals  I just want the pain , swelling and motion  better and the use of my hands - and to know what causing it     Currently in Pain?  Yes    Pain Score  3     Pain Location  Hand    Pain Orientation  Left    Pain Descriptors / Indicators  Aching;Tightness    Pain Type  Chronic pain    Pain Onset  More than a month ago    Pain Frequency  Constant         [facility]  OT Assessment - 05/05/18 0001      Right Hand AROM   R Little  MCP 0-90  90 Degrees    R Little PIP 0-100  95 Degrees   PROM -60 , AROM -71     Left Hand AROM   L Ring  MCP 0-90  90 Degrees    L Ring PIP 0-100  80 Degrees   -30        Paraffin done to R hand with LMB splint for PIP extention on R hand prior to redoing cast  Pt did show increase AROM of PIP 9 degrees and PIP extention PROM 10 degrees    Serial cast redone this date on R 5th PIP - using orthocast - at -60 degrees  pt ed on precautions  Pt verbalize understanding - and to keep it dry  Await [facility] to discharge her out of Medicaid system                OT Short Term Goals - 04/28/18 1950      OT SHORT TERM GOAL #1   Title  Pt to be independent in HEP to tolerate serial cast to increase extention on R 5th PIP ,and using splint and ROM to increase ROM  in L 4th PIP     Baseline  pain and edema at [facility] to increased - pt could not tolerate any splint , strap or cast on PIP of 5th and scar     Time  3    Period  Weeks    Status  New    Target Date  05/19/18      OT SHORT TERM GOAL #2   Title  Pain on PRWHE improve with more than 10  points     Baseline  pain on Aug and Sept was 43/50 on PRWHE  and now 27/50 -      Time  4    Period  Weeks    Status  New    Target Date  05/26/18        OT Long Term Goals - 04/28/18 1953      OT LONG TERM GOAL #1   Title  R 5th PIP extention  increase with more than 25 degrees to wash face, get gloves on and hand in pocket    Baseline  flexion at PIP 100 , extention AROM -80 and PROM -75     Time  6    Period  Weeks    Status  New    Target Date  06/09/18      OT LONG TERM GOAL #2   Title  Function score on PRWHE improve for pt to use hand more than 10 points     Baseline  at eval PRWHE function score was 17 06/12/48  - to carry more than 8 lbs, hold broom , get gloves on    Time  6    Period  Weeks    Status  New    Target Date  06/09/18               Patient will benefit from skilled therapeutic intervention in order to improve the following deficits and impairments:     Visit Diagnosis: Stiffness of right hand, not elsewhere classified    Problem List Patient Active Problem List   Diagnosis Date Noted  . Encounter for gynecological examination without abnormal finding 04/06/2015  . Mixed incontinence 04/06/2015  . 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 Bursitis of shoulder 12/29/2014  . Tendinitis of right shoulder 12/29/2014  . Other shoulder lesions, right shoulder 12/29/2014  . Episode of syncope 05/11/2014  . Atrial flutter (HCC) 09/05/2012  . CFIDS (chronic fatigue and immune dysfunction syndrome) (HCC) 09/05/2012  . 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 Postural orthostatic tachycardia syndrome 08/30/2011  . Fast heart beat 08/30/2011  . Supraventricular arrhythmia 05/28/2011  . UNSPECIFIED MYOCARDITIS 07/20/2010  . SINUS TACHYCARDIA 07/18/2010  . DYSAUTONOMIA 07/18/2010  . PALPITATIONS, RECURRENT 07/18/2010  . DYSPNEA ON EXERTION 07/18/2010  . 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    Oletta Cohn OTR/L,CLT 05/05/2018, 10:14 AM  [facility] PHYSICAL AND SPORTS MEDICINE [address] [place], [address] Phone: [phone]   Fax:  [phone]  Name: Lisa Clay MRN: [id number] Date of Birth: [birth year]

## 2018-05-12 ENCOUNTER — Ambulatory Visit: Payer: Medicaid Other | Attending: Orthopedic Surgery | Admitting: Occupational Therapy

## 2018-05-12 DIAGNOSIS — M79645 Pain in left finger(s): Secondary | ICD-10-CM | POA: Diagnosis present

## 2018-05-12 DIAGNOSIS — M25642 Stiffness of left hand, not elsewhere classified: Secondary | ICD-10-CM | POA: Insufficient documentation

## 2018-05-12 DIAGNOSIS — M25641 Stiffness of right hand, not elsewhere classified: Secondary | ICD-10-CM | POA: Insufficient documentation

## 2018-05-12 DIAGNOSIS — M79644 Pain in right finger(s): Secondary | ICD-10-CM | POA: Diagnosis present

## 2018-05-12 DIAGNOSIS — M6281 Muscle weakness (generalized): Secondary | ICD-10-CM | POA: Diagnosis present

## 2018-05-12 DIAGNOSIS — R600 Localized edema: Secondary | ICD-10-CM | POA: Insufficient documentation

## 2018-05-12 DIAGNOSIS — L905 Scar conditions and fibrosis of skin: Secondary | ICD-10-CM | POA: Insufficient documentation

## 2018-05-12 NOTE — Therapy (Signed)
[facility] [address] [place], [address] Phone: [phone]   Fax:  [phone]  Occupational Therapy Treatment  Patient Details  Name: Lisa Clay MRN: [id number] Date of Birth: 11-24-1970 Referring Provider (OT): Grant Fontana   Encounter Date: 05/12/2018  OT End of Session - 05/12/18 1015    Visit Number  2    Number of Visits  6    Date for OT Re-Evaluation  06/09/18    Authorization - Visit Number  1    Authorization - Number of Visits  4    OT Start Time  0950    OT Stop Time  1035    OT Time Calculation (min)  45 min    Activity Tolerance  Patient tolerated treatment well    Behavior During Therapy  [facility] for tasks assessed/performed       Past Medical History:  Diagnosis Date  . Atrial tachycardia (HCC)   . Dysrhythmia    Hx: PSVT. Current: Postural Orthostatic Tachycardia Syndrome, Inappropriate Sinus Node Tachycardia  . Headache    migraines, rare.  . Kidney stones   . Palpitations    [facility] 2/12  . Shortness of breath dyspnea    on exertion, secondary to heart issues  . Sinus tachycardia    [facility] 2/12  . Syncope    secondary to heart issues    Past Surgical History:  Procedure Laterality Date  . BREAST BIOPSY Left 05/11/2013   stereo biopsy  . CARDIAC ELECTROPHYSIOLOGY STUDY AND ABLATION  04/24/11   [facility]  . CLOSED MANIPULATION SHOULDER WITH STERIOD INJECTION Right 02/16/2015   Procedure: CLOSED MANIPULATION SHOULDER WITH STEROID INJECTION;  Surgeon: Christena Flake, MD;  Location: [facility];  Service: Orthopedics;  Laterality: Right;  . FINGER SURGERY Right 12/25/2017   explorative surgery on R5th PIP   . TONSILLECTOMY      There were no vitals filed for this visit.  Subjective Assessment - 05/12/18 1010    Subjective   MY cast stayed on - but it feels like the tip is pushed up - but I was able to keep in on - pain did get better after we  casted it     Patient Stated Goals  I just want the pain , swelling and motion  better and the use of my hands - and to know what causing it     Currently in Pain?  No/denies       PT arrive with cast on this date - took some time to remove cast - but able to  - DIP was included this past week  extention of PIP this date -40 - progressing well with serial cast   Flexion of PIP this date 80             OT Treatments/Exercises (OP) - 05/12/18 0001      RUE Paraffin   Number Minutes Paraffin  10 Minutes    RUE Paraffin Location  Hand    Comments  LMB splint on prior to cast making       LUE Paraffin   Number Minutes Paraffin  10 Minutes    LUE Paraffin Location  Hand    Comments  SOC prior to joint jack to increase extention      Done some soft tissue mobs and joint mobs to R 5th PIP and L 4th PIP - was able to get to -30 on L 4th  And -55 with PROM on 4th PIP on R digit   Serial cast redone this date on R 5th PIP - using orthocast - at -55 degrees  pt ed on precautions  Pt verbalize understanding - and to keep it dry        OT Education - 05/12/18 1014    Education Details  cast remade and wearing     Person(s) Educated  Patient    Methods  Explanation;Demonstration    Comprehension  Verbalized understanding;Returned demonstration       OT Short Term Goals - 04/28/18 1950      OT SHORT TERM GOAL #1   Title  Pt to be independent in HEP to tolerate serial cast to increase extention on R 5th PIP ,and using splint and ROM to increase ROM in L 4th PIP     Baseline  pain and edema at [facility] to increased - pt could not tolerate any splint , strap or cast on PIP of 5th and scar     Time  3    Period  Weeks    Status  New    Target Date  05/19/18      OT SHORT TERM GOAL #2   Title  Pain on PRWHE improve with more than 10  points     Baseline  pain on Aug and Sept was 43/50 on PRWHE  and now 27/50 -      Time  4    Period  Weeks    Status  New    Target Date   05/26/18        OT Long Term Goals - 04/28/18 1953      OT LONG TERM GOAL #1   Title  R 5th PIP extention  increase with more than 25 degrees to wash face, get gloves on and hand in pocket    Baseline  flexion at PIP 100 , extention AROM -80 and PROM -75     Time  6    Period  Weeks    Status  New    Target Date  06/09/18      OT LONG TERM GOAL #2   Title  Function score on PRWHE improve for pt to use hand more than 10 points     Baseline  at eval PRWHE function score was 17 06/12/48  - to carry more than 8 lbs, hold broom , get gloves on    Time  6    Period  Weeks    Status  New    Target Date  06/09/18            Plan - 05/12/18 1016    Clinical Impression Statement  Pt was able to keep serial cast on and extention this date increase coming in after cast taken off at R 5th PIP -60 degrees , flexion 80 - pt did loose some extention on L 4th PIP this date -20 - pt report she could not use her extnetion sleeve last night - fell asleep - in session this date was able to get PROM extention of R 5th PIP to 55- attempted to serial cast at -55- kept  part of DIP out per pt request     Occupational performance deficits (Please refer to evaluation for details):  ADL's;IADL's;Rest and Sleep;Play;Leisure;Social Participation    Rehab Potential  Fair    Current Impairments/barriers affecting progress:  Pt 4 wks s/p - flexion contracture increasing over months - had pain in  beginning could not do serial cast -     OT Frequency  1x / week    OT Duration  4 weeks    OT Treatment/Interventions  Self-care/ADL training;Therapeutic exercise;Patient/family education;Splinting;Paraffin;Manual Therapy;Passive range of motion;Scar mobilization    Plan  assess PIP extention on 5th R - and recast with increase extention if possible     Clinical Decision Making  Several treatment options, min-mod task modification necessary    OT Home Exercise Plan  see pt instruction    Consulted and Agree with Plan  of Care  Patient       Patient will benefit from skilled therapeutic intervention in order to improve the following deficits and impairments:  Pain, Impaired flexibility, Decreased coordination, Decreased strength, Decreased range of motion, Impaired UE functional use  Visit Diagnosis: Stiffness of right hand, not elsewhere classified  Stiffness of left hand, not elsewhere classified  Pain in finger of both hands    Problem List Patient Active Problem List   Diagnosis Date Noted  . Encounter for gynecological examination without abnormal finding 04/06/2015  . Mixed incontinence 04/06/2015  . 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 Bursitis of shoulder 12/29/2014  . Tendinitis of right shoulder 12/29/2014  . Other shoulder lesions, right shoulder 12/29/2014  . Episode of syncope 05/11/2014  . Atrial flutter (HCC) 09/05/2012  . CFIDS (chronic fatigue and immune dysfunction syndrome) (HCC) 09/05/2012  . 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 Postural orthostatic tachycardia syndrome 08/30/2011  . Fast heart beat 08/30/2011  . Supraventricular arrhythmia 05/28/2011  . UNSPECIFIED MYOCARDITIS 07/20/2010  . SINUS TACHYCARDIA 07/18/2010  . DYSAUTONOMIA 07/18/2010  . PALPITATIONS, RECURRENT 07/18/2010  . DYSPNEA ON EXERTION 07/18/2010  . 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    Oletta CohnuPreez, Jayston Trevino OTR/L,CLT 05/12/2018, 11:23 AM  [facility] [address] [place], [place], [address] Phone: [phone]   Fax:  [phone]  Name: Lisa SaucierCarly Melissa Clay MRN: [id number] Date of Birth: Mar 21, 1971

## 2018-05-12 NOTE — Patient Instructions (Signed)
same

## 2018-05-14 ENCOUNTER — Ambulatory Visit: Payer: Medicaid Other | Admitting: Occupational Therapy

## 2018-05-14 DIAGNOSIS — M25641 Stiffness of right hand, not elsewhere classified: Secondary | ICD-10-CM

## 2018-05-14 NOTE — Therapy (Signed)
[place] PHYS[facility] SPORTS MEDICINE [address] [place], [address] Phone: [phone]   Fax:  [phone]  Occupational Therapy Treatment  Patient Details  Name: Lisa Clay MRN: [id number] Date of Birth: [birth year] Referring Provider (OT): Grant Fontana   Encounter Date: 05/14/2018    Past Medical History:  Diagnosis Date  . Atrial tachycardia (HCC)   . Dysrhythmia    Hx: PSVT. Current: Postural Orthostatic Tachycardia Syndrome, Inappropriate Sinus Node Tachycardia  . Headache    migraines, rare.  . Kidney stones   . Palpitations    [facility] 2/12  . Shortness of breath dyspnea    on exertion, secondary to heart issues  . Sinus tachycardia    [facility] 2/12  . Syncope    secondary to heart issues    Past Surgical History:  Procedure Laterality Date  . BREAST BIOPSY Left 05/11/2013   stereo biopsy  . CARDIAC ELECTROPHYSIOLOGY STUDY AND ABLATION  04/24/11   [facility]  . CLOSED MANIPULATION SHOULDER WITH STERIOD INJECTION Right 02/16/2015   Procedure: CLOSED MANIPULATION SHOULDER WITH STEROID INJECTION;  Surgeon: Christena Flake, MD;  Location: [facility];  Service: Orthopedics;  Laterality: Right;  . FINGER SURGERY Right 12/25/2017   explorative surgery on R5th PIP   . TONSILLECTOMY      There were no vitals filed for this visit.  Subjective Assessment - 05/14/18 1035    Subjective   I had to take off my cast yesterday - the joint just felt like there was stabbing pain - and tender on the joint - I tried to keep at least something on it since then to maintain what we gained     Patient Stated Goals  I just want the pain , swelling and motion  better and the use of my hands - and to know what causing it        Pt arrive with cast off that was done 2 days ago - told pt via phone yesterday to give joint break and skin and redo cast today- some redness on dorsal side of PIP joint  Done PIP cast for  extention this date again - but did not put as much pressure on dorsal proximal joint - but still was able to keep extention to -50 to-55 degrees Pt to keep on until next appt , if no issues Did arrive with maintaining of -60 to -65 AROM extention of PIP R 5th                       OT Short Term Goals - 04/28/18 1950      OT SHORT TERM GOAL #1   Title  Pt to be independent in HEP to tolerate serial cast to increase extention on R 5th PIP ,and using splint and ROM to increase ROM in L 4th PIP     Baseline  pain and edema at [facility] to increased - pt could not tolerate any splint , strap or cast on PIP of 5th and scar     Time  3    Period  Weeks    Status  New    Target Date  05/19/18      OT SHORT TERM GOAL #2   Title  Pain on PRWHE improve with more than 10  points     Baseline  pain on Aug and Sept was 43/50 on PRWHE  and now 27/50 -  Time  4    Period  Weeks    Status  New    Target Date  05/26/18        OT Long Term Goals - 04/28/18 1953      OT LONG TERM GOAL #1   Title  R 5th PIP extention  increase with more than 25 degrees to wash face, get gloves on and hand in pocket    Baseline  flexion at PIP 100 , extention AROM -80 and PROM -75     Time  6    Period  Weeks    Status  New    Target Date  06/09/18      OT LONG TERM GOAL #2   Title  Function score on PRWHE improve for pt to use hand more than 10 points     Baseline  at eval PRWHE function score was 17 06/12/48  - to carry more than 8 lbs, hold broom , get gloves on    Time  6    Period  Weeks    Status  New    Target Date  06/09/18              Patient will benefit from skilled therapeutic intervention in order to improve the following deficits and impairments:     Visit Diagnosis: Stiffness of right hand, not elsewhere classified    Problem List Patient Active Problem List   Diagnosis Date Noted  . Encounter for gynecological examination without abnormal finding 04/06/2015   . Mixed incontinence 04/06/2015  . 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 Bursitis of shoulder 12/29/2014  . Tendinitis of right shoulder 12/29/2014  . Other shoulder lesions, right shoulder 12/29/2014  . Episode of syncope 05/11/2014  . Atrial flutter (HCC) 09/05/2012  . CFIDS (chronic fatigue and immune dysfunction syndrome) (HCC) 09/05/2012  . 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 Postural orthostatic tachycardia syndrome 08/30/2011  . Fast heart beat 08/30/2011  . Supraventricular arrhythmia 05/28/2011  . UNSPECIFIED MYOCARDITIS 07/20/2010  . SINUS TACHYCARDIA 07/18/2010  . DYSAUTONOMIA 07/18/2010  . PALPITATIONS, RECURRENT 07/18/2010  . DYSPNEA ON EXERTION 07/18/2010  . 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    Oletta CohnuPreez, Malick Netz OTR/L,CLT 05/14/2018, 10:38 AM   [facility] [address] [place], [place], [address] Phone: [phone]   Fax:  [phone]  Name: Lisa SaucierCarly Melissa Clay MRN: [id number] Date of Birth: May 20, 1971

## 2018-05-19 ENCOUNTER — Ambulatory Visit: Payer: Medicaid Other | Admitting: Occupational Therapy

## 2018-05-19 DIAGNOSIS — M25641 Stiffness of right hand, not elsewhere classified: Secondary | ICD-10-CM

## 2018-05-19 DIAGNOSIS — M79645 Pain in left finger(s): Secondary | ICD-10-CM

## 2018-05-19 DIAGNOSIS — M79644 Pain in right finger(s): Secondary | ICD-10-CM

## 2018-05-19 DIAGNOSIS — M25642 Stiffness of left hand, not elsewhere classified: Secondary | ICD-10-CM

## 2018-05-19 NOTE — Patient Instructions (Signed)
To keep serial cast on R 5th PIP

## 2018-05-19 NOTE — Therapy (Signed)
[facility] PHYSICAL AND SPORTS MEDICINE [address] [place], [place], [address] Phone: [phone]   Fax:  [phone]  Occupational Therapy Treatment  Patient Details  Name: Lisa SaucierCarly Melissa Florer MRN: [id number] Date of Birth: 1971-01-19 Referring Provider (OT): Grant Fontanamarc Richards   Encounter Date: 05/19/2018  OT End of Session - 05/19/18 1128    Visit Number  3    Number of Visits  6    Date for OT Re-Evaluation  06/09/18    Authorization - Visit Number  2    Authorization - Number of Visits  4    OT Start Time  1031    OT Stop Time  1125    OT Time Calculation (min)  54 min    Activity Tolerance  Patient tolerated treatment well    Behavior During Therapy  [facility] for tasks assessed/performed       Past Medical History:  Diagnosis Date  . Atrial tachycardia (HCC)   . Dysrhythmia    Hx: PSVT. Current: Postural Orthostatic Tachycardia Syndrome, Inappropriate Sinus Node Tachycardia  . Headache    migraines, rare.  . Kidney stones   . [facility]  . Shortness of breath dyspnea    on exertion, secondary to heart issues  . Sinus tachycardia    [facility]  . Syncope    secondary to heart issues    Past Surgical History:  Procedure Laterality Date  . BREAST BIOPSY Left 05/11/2013   stereo biopsy  . CARDIAC ELECTROPHYSIOLOGY STUDY AND ABLATION  04/24/11   [facility]  . CLOSED MANIPULATION SHOULDER WITH STERIOD INJECTION Right 02/16/2015   Procedure: CLOSED MANIPULATION SHOULDER WITH STEROID INJECTION;  Surgeon: Christena FlakeJohn J Poggi, MD;  [facility];  Service: Orthopedics;  Laterality: Right;  . FINGER SURGERY Right 12/25/2017   explorative surgery on R5th PIP   . TONSILLECTOMY      There were no vitals filed for this visit.  Subjective Assessment - 05/19/18 1126    Subjective   The cast did not stay on - it was to loose - I tried to keep the spring splint on , stretching during day-  could not get  something on at night time     Patient Stated Goals  I just want the pain , swelling and motion  better and the use of my hands - and to know what causing it     Currently in Pain?  No/denies         [facility] - 05/19/18 0001      Right Hand AROM   R Little  MCP 0-90  90 Degrees    R Little PIP 0-100  -65 Degrees   PROM -50 after graston     Left Hand AROM   L Ring PIP 0-100  -28 Degrees   -25 after graston     Pt arrive with cast off - pt could not tolerate last week new one - tender over dorsal PIP - and the one redone on Wed- come loose - was to loose per pt  She arrive this date with -65 at R 5th PIP - and PROM -55         OT Treatments/Exercises (OP) - 05/19/18 0001      RUE Paraffin   Number Minutes Paraffin  10 Minutes    RUE Paraffin Location  Hand    Comments  LMB splint on 5th PIP for extention stretch  LUE Paraffin   Number Minutes Paraffin  10 Minutes    LUE Paraffin Location  Hand    Comments  SOC prior to stretch and soft tissue mobs        done afterwards extended Graston tool brushing tech with tool nr 2 on volar R 5th and 4th digits and in palm  As well as over L volar 4th digits and palm  Tender over bilateral 4th PIP and A1pulleys  Did gain PROM for R 5th PIP extention -50    Serial castredone this date onR 5th PIP - using orthocast - at -55 degrees pt ed on precautions again - and kept DIP open  Pt verbalize understanding - and to keep it dry- keep on for 9 days until MD appt     OT Education - 05/19/18 1127    Education Details  cast remade and wearing     Person(s) Educated  Patient    Methods  Explanation;Demonstration    Comprehension  Verbalized understanding;Returned demonstration       OT Short Term Goals - 04/28/18 1950      OT SHORT TERM GOAL #1   Title  Pt to be independent in HEP to tolerate serial cast to increase extention on R 5th PIP ,and using splint and ROM to increase ROM in L 4th PIP      Baseline  pain and edema at [facility] to increased - pt could not tolerate any splint , strap or cast on PIP of 5th and scar     Time  3    Period  Weeks    Status  New    Target Date  05/19/18      OT SHORT TERM GOAL #2   Title  Pain on PRWHE improve with more than 10  points     Baseline  pain on Aug and Sept was 43/50 on PRWHE  and now 27/50 -      Time  4    Period  Weeks    Status  New    Target Date  05/26/18        OT Long Term Goals - 04/28/18 1953      OT LONG TERM GOAL #1   Title  R 5th PIP extention  increase with more than 25 degrees to wash face, get gloves on and hand in pocket    Baseline  flexion at PIP 100 , extention AROM -80 and PROM -75     Time  6    Period  Weeks    Status  New    Target Date  06/09/18      OT LONG TERM GOAL #2   Title  Function score on PRWHE improve for pt to use hand more than 10 points     Baseline  at eval PRWHE function score was 17 06/12/48  - to carry more than 8 lbs, hold broom , get gloves on    Time  6    Period  Weeks    Status  New    Target Date  06/09/18            Plan - 05/19/18 1128    Clinical Impression Statement  Pt was unable to tolerate cast last week made -dorsal PIP was tender after previous one and remade one wiht screen Wed - did not do replacement to tight -and it come looseper pt - she tried to keep it stretch - extention of PIP R 5th -65  and PROM in session was able to get -50 after paraffin and graston tool - casted this date -55  to wear until appt with MD next Wed     Occupational performance deficits (Please refer to evaluation for details):  ADL's;IADL's;Rest and Sleep;Play;Leisure;Social Participation    Rehab Potential  Fair    OT Frequency  1x / week    OT Duration  2 weeks    OT Treatment/Interventions  Self-care/ADL training;Therapeutic exercise;Patient/family education;Splinting;Paraffin;Manual Therapy;Passive range of motion;Scar mobilization    Plan  assess PIP extention and casting -results  from MD visit next week     Clinical Decision Making  Several treatment options, min-mod task modification necessary    OT Home Exercise Plan  see pt instruction    Consulted and Agree with Plan of Care  Patient       Patient will benefit from skilled therapeutic intervention in order to improve the following deficits and impairments:  Pain, Impaired flexibility, Decreased coordination, Decreased strength, Decreased range of motion, Impaired UE functional use  Visit Diagnosis: Stiffness of right hand, not elsewhere classified  Stiffness of left hand, not elsewhere classified  Pain in finger of both hands    Problem List Patient Active Problem List   Diagnosis Date Noted  . Encounter for gynecological examination without abnormal finding 04/06/2015  . Mixed incontinence 04/06/2015  . 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 Bursitis of shoulder 12/29/2014  . Tendinitis of right shoulder 12/29/2014  . Other shoulder lesions, right shoulder 12/29/2014  . Episode of syncope 05/11/2014  . Atrial flutter (HCC) 09/05/2012  . CFIDS (chronic fatigue and immune dysfunction syndrome) (HCC) 09/05/2012  . 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 Postural orthostatic tachycardia syndrome 08/30/2011  . Fast heart beat 08/30/2011  . Supraventricular arrhythmia 05/28/2011  . UNSPECIFIED MYOCARDITIS 07/20/2010  . SINUS TACHYCARDIA 07/18/2010  . DYSAUTONOMIA 07/18/2010  . PALPITATIONS, RECURRENT 07/18/2010  . DYSPNEA ON EXERTION 07/18/2010  . 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    Oletta Cohn OTR/L,CLT 05/19/2018, 11:33 AM  [facility] PHYSICAL AND SPORTS MEDICINE [address], [place], [address] Phone: [phone]   Fax:  [phone]  Name: Hertha Gergen MRN: [id number] Date of Birth: 01-28-1971

## 2018-05-29 ENCOUNTER — Ambulatory Visit: Payer: Medicaid Other | Admitting: Occupational Therapy

## 2018-05-29 DIAGNOSIS — M25641 Stiffness of right hand, not elsewhere classified: Secondary | ICD-10-CM | POA: Diagnosis not present

## 2018-05-29 DIAGNOSIS — M6281 Muscle weakness (generalized): Secondary | ICD-10-CM

## 2018-05-29 DIAGNOSIS — L905 Scar conditions and fibrosis of skin: Secondary | ICD-10-CM

## 2018-05-29 DIAGNOSIS — M79645 Pain in left finger(s): Secondary | ICD-10-CM

## 2018-05-29 DIAGNOSIS — R600 Localized edema: Secondary | ICD-10-CM

## 2018-05-29 DIAGNOSIS — M25642 Stiffness of left hand, not elsewhere classified: Secondary | ICD-10-CM

## 2018-05-29 DIAGNOSIS — M79644 Pain in right finger(s): Secondary | ICD-10-CM

## 2018-05-29 NOTE — Patient Instructions (Signed)
Same

## 2018-05-29 NOTE — Therapy (Signed)
[facility]ICINE [address] [place], [address] Phone: [phone]   Fax:  [phone]  Occupational Therapy Treatment  Patient Details  Name: Lisa Clay MRN: [id number] Date of Birth: March 21, 1971 Referring Provider (OT): Grant Fontana   Encounter Date: 05/29/2018  OT End of Session - 05/29/18 1305    Visit Number  4    Number of Visits  6    Date for OT Re-Evaluation  06/09/18    Authorization - Visit Number  3    Authorization - Number of Visits  4    OT Start Time  0941    OT Stop Time  1028    OT Time Calculation (min)  47 min    Activity Tolerance  Patient tolerated treatment well    Behavior During Therapy  [facility] for tasks assessed/performed       Past Medical History:  Diagnosis Date  . Atrial tachycardia (HCC)   . Dysrhythmia    Hx: PSVT. Current: Postural Orthostatic Tachycardia Syndrome, Inappropriate Sinus Node Tachycardia  . Headache    migraines, rare.  . Kidney stones   . Palpitations    [facility] 2/12  . Shortness of breath dyspnea    on exertion, secondary to heart issues  . Sinus tachycardia    [facility] 2/12  . Syncope    secondary to heart issues    Past Surgical History:  Procedure Laterality Date  . BREAST BIOPSY Left 05/11/2013   stereo biopsy  . CARDIAC ELECTROPHYSIOLOGY STUDY AND ABLATION  04/24/11   [facility]  . CLOSED MANIPULATION SHOULDER WITH STERIOD INJECTION Right 02/16/2015   Procedure: CLOSED MANIPULATION SHOULDER WITH STEROID INJECTION;  Surgeon: Christena Flake, MD;  [facility];  Service: Orthopedics;  Laterality: Right;  . FINGER SURGERY Right 12/25/2017   explorative surgery on R5th PIP   . TONSILLECTOMY      There were no vitals filed for this visit.  Subjective Assessment - 05/29/18 0941    Subjective   I seen all my Dr 's this week - it was crazy - kept on cast until about 2 hrs before I seen the MD - making progress with  casting and MD said to cont casting for another 6 wks     Patient Stated Goals  I just want the pain , swelling and motion  better and the use of my hands - and to know what causing it     Currently in Pain?  Yes    Pain Score  4     Pain Location  Hand    Pain Orientation  Left;Right    Pain Descriptors / Indicators  Aching   pressure , full tight feeling    Pain Type  Chronic pain    Pain Onset  More than a month ago         [facility] OT Assessment - 05/29/18 0001      Right Hand AROM   R Little  MCP 0-90  90 Degrees    R Little PIP 0-100  95 Degrees   -65 AROM , PROM in session -45 this date      Left Hand AROM   L Ring PIP 0-100  85 Degrees   -29 and end of session -25     MEasurement taken   see flowsheet          OT Treatments/Exercises (OP) - 05/29/18 0001      RUE Paraffin  Number Minutes Paraffin  10 Minutes    RUE Paraffin Location  Hand    Comments  LMB splint on for PIP extention       LUE Paraffin   Number Minutes Paraffin  10 Minutes    LUE Paraffin Location  Hand    Comments  LMB splint on for extention         extended Graston tool brushing tech with tool nr 2 on volar R 5th and 4th digits and in palm  As well as over L volar 4th digits and palm  With PROM Tender over bilateral 4th PIP and A1pulleys  Did gain PROM for R 5th PIP extention -45   Serial castredone this date onR 5th PIP - using orthocast - at -55degrees pt ed on precautions again - and kept DIP open  Pt verbalize understanding - and to keep it dry         OT Education - 05/29/18 1305    Education Details  cast remade and wearing     Person(s) Educated  Patient    Methods  Explanation;Demonstration    Comprehension  Verbalized understanding;Returned demonstration       OT Short Term Goals - 04/28/18 1950      OT SHORT TERM GOAL #1   Title  Pt to be independent in HEP to tolerate serial cast to increase extention on R 5th PIP ,and using splint and ROM to  increase ROM in L 4th PIP     [facility]  [facility] - pt could not tolerate any splint , strap or cast on PIP of 5th and scar     Time  3    Period  Weeks    Status  New    Target Date  05/19/18      OT SHORT TERM GOAL #2   Title  Pain on PRWHE improve with more than 10  points     Baseline  pain on Aug and Sept was 43/50 on PRWHE  and now 27/50 -      Time  4    Period  Weeks    Status  New    Target Date  05/26/18        OT Long Term Goals - 04/28/18 1953      OT LONG TERM GOAL #1   Title  R 5th PIP extention  increase with more than 25 degrees to wash face, get gloves on and hand in pocket    Baseline  flexion at PIP 100 , extention AROM -80 and PROM -75     Time  6    Period  Weeks    Status  New    Target Date  06/09/18      OT LONG TERM GOAL #2   Title  Function score on PRWHE improve for pt to use hand more than 10 points     Baseline  at eval PRWHE function score was 17 06/12/48  - to carry more than 8 lbs, hold broom , get gloves on    Time  6    Period  Weeks    Status  New    Target Date  06/09/18            Plan - 05/29/18 1306    Clinical Impression Statement  Pt was able to keep cast on until yesterday when she followed up with ortho - [facility] -but cont to be this  date at -2765 at R 5th PIP - was able ot get her PROM in session to -45- but unable to maintain her exention - ortho order another 6 wks of casting for extention of R 5th PIP     Occupational performance deficits (Please refer to evaluation for details):  ADL's;IADL's;Rest and Sleep;Play;Leisure;Social Participation    Rehab Potential  Fair    Current Impairments/barriers affecting progress:  Pt 4 wks s/p - flexion contracture increasing over months - had pain in beginning could not do serial cast -     OT Frequency  1x / week    OT Duration  6 weeks    OT Treatment/Interventions  Self-care/ADL training;Therapeutic exercise;Patient/family  education;Splinting;Paraffin;Manual Therapy;Passive range of motion;Scar mobilization    Plan  assess PIP extention and cast again     Clinical Decision Making  Several treatment options, min-mod task modification necessary    OT Home Exercise Plan  see pt instruction    Consulted and Agree with Plan of Care  Patient       Patient will benefit from skilled therapeutic intervention in order to improve the following deficits and impairments:  Pain, Impaired flexibility, Decreased coordination, Decreased strength, Decreased range of motion, Impaired UE functional use  Visit Diagnosis: Stiffness of right hand, not elsewhere classified  Stiffness of left hand, not elsewhere classified  Pain in finger of both hands  Scar condition and fibrosis of skin  Muscle weakness (generalized)  Localized edema    Problem List Patient Active Problem List   Diagnosis Date Noted  . Encounter for gynecological examination without abnormal finding 04/06/2015  . Mixed incontinence 04/06/2015  . 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 Bursitis of shoulder 12/29/2014  . Tendinitis of right shoulder 12/29/2014  . Other shoulder lesions, right shoulder 12/29/2014  . Episode of syncope 05/11/2014  . Atrial flutter (HCC) 09/05/2012  . CFIDS (chronic fatigue and immune dysfunction syndrome) (HCC) 09/05/2012  . 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 Postural orthostatic tachycardia syndrome 08/30/2011  . Fast heart beat 08/30/2011  . Supraventricular arrhythmia 05/28/2011  . UNSPECIFIED MYOCARDITIS 07/20/2010  . SINUS TACHYCARDIA 07/18/2010  . DYSAUTONOMIA 07/18/2010  . PALPITATIONS, RECURRENT 07/18/2010  . DYSPNEA ON EXERTION 07/18/2010  . 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    Oletta CohnuPreez, Onetta Spainhower OTR/L,CLT 05/29/2018, 1:09 PM  [facility] [address] [place], [place], [address] Phone: [phone]   Fax:  [phone]  Name:  Lisa Clay MRN: [id number] Date of Birth: Oct 13, 1970

## 2018-06-06 ENCOUNTER — Ambulatory Visit: Payer: Medicaid Other | Admitting: Occupational Therapy

## 2018-06-06 DIAGNOSIS — M25641 Stiffness of right hand, not elsewhere classified: Secondary | ICD-10-CM | POA: Diagnosis not present

## 2018-06-06 DIAGNOSIS — M79645 Pain in left finger(s): Secondary | ICD-10-CM

## 2018-06-06 DIAGNOSIS — M25642 Stiffness of left hand, not elsewhere classified: Secondary | ICD-10-CM

## 2018-06-06 DIAGNOSIS — M6281 Muscle weakness (generalized): Secondary | ICD-10-CM

## 2018-06-06 DIAGNOSIS — L905 Scar conditions and fibrosis of skin: Secondary | ICD-10-CM

## 2018-06-06 DIAGNOSIS — M79644 Pain in right finger(s): Secondary | ICD-10-CM

## 2018-06-06 DIAGNOSIS — R600 Localized edema: Secondary | ICD-10-CM

## 2018-06-06 NOTE — Patient Instructions (Signed)
Cont cast on the R 5th PIP  And joint jack and ROM on L 4th PIP

## 2018-06-06 NOTE — Therapy (Signed)
[place] [facility] [address], [place], [address] Phone: [phone]   Fax:  [phone]  Occupational Therapy Treatment  Patient Details  Name: Lisa Clay MRN: [id number] Date of Birth: 01/17/1971 Referring Provider (OT): Grant Fontana   Encounter Date: 06/06/2018  OT End of Session - 06/06/18 1252    Visit Number  5    Number of Visits  10    Date for OT Re-Evaluation  07/11/18    Authorization - Visit Number  4    Authorization - Number of Visits  4    OT Start Time  1004    OT Stop Time  1043    OT Time Calculation (min)  39 min    Activity Tolerance  Patient tolerated treatment well    Behavior During Therapy  [facility] for tasks assessed/performed       Past Medical History:  Diagnosis Date  . Atrial tachycardia (HCC)   . Dysrhythmia    Hx: PSVT. Current: Postural Orthostatic Tachycardia Syndrome, Inappropriate Sinus Node Tachycardia  . Headache    migraines, rare.  . Kidney stones   . Palpitations    [facility] 2/12  . Shortness of breath dyspnea    on exertion, secondary to heart issues  . Sinus tachycardia    [facility] 2/12  . Syncope    secondary to heart issues    Past Surgical History:  Procedure Laterality Date  . BREAST BIOPSY Left 05/11/2013   stereo biopsy  . CARDIAC ELECTROPHYSIOLOGY STUDY AND ABLATION  04/24/11   [facility]  . CLOSED MANIPULATION SHOULDER WITH STERIOD INJECTION Right 02/16/2015   Procedure: CLOSED MANIPULATION SHOULDER WITH STEROID INJECTION;  Surgeon: Christena Flake, MD;  Location: [facility];  Service: Orthopedics;  Laterality: Right;  . FINGER SURGERY Right 12/25/2017   explorative surgery on R5th PIP   . TONSILLECTOMY      There were no vitals filed for this visit.  Subjective Assessment - 06/06/18 1248    Subjective   I just took the cast off about 30 min ago - looking better - but my 4th finger on the L , I know is worse - my joint was  more purple and swollen - Dr said last week to cont 6 wks of serial casting     Patient Stated Goals  I just want the pain , swelling and motion  better and the use of my hands - and to know what causing it     Currently in Pain?  Yes    Pain Score  4     Pain Location  Hand    Pain Orientation  Right;Left    Pain Descriptors / Indicators  Aching;Tender;Sore    Pain Type  Chronic pain    Pain Onset  More than a month ago         [facility] OT Assessment - 06/06/18 0001      Right Hand AROM   R Little  MCP 0-90  90 Degrees    R Little PIP 0-100  --   -55 today , PROM -45 SOC      Left Hand AROM   L Ring PIP 0-100  80 Degrees   -40        AROM and PROM assess- see flowsheet       OT Treatments/Exercises (OP) - 06/06/18 0001      RUE Paraffin   Number Minutes Paraffin  10 Minutes  RUE Paraffin Location  Hand    Comments  LMB splint on 5th PIP for extention prior to casting       LUE Paraffin   Number Minutes Paraffin  10 Minutes    LUE Paraffin Location  Hand    Comments  LMB splint on 4th PIP to decrease flexion contracture         Graston tool brushing tech with tool nr 2 on volar R 5th and 4th digits and in palm  Come in with worse extention and more swelling and pain since last week in L 4th   With PROM and stretch - combination with massage  Done gentle traction and joint mobs on PIP's   Tender over bilateral 4th PIP and A1pulleys  Did gain PROM for R 5th PIP extention -45 and AROM -55 Serial castredone this date onR 5th PIP - using orthocast - at -45 to -50degrees pt ed on precautions again - and kept DIP partially  open Pt verbalize understanding - and to keep it dry         OT Education - 06/06/18 1251    Education Details  cast remade and wearing - as well as ROM and joint jack use on L     Person(s) Educated  Patient    Methods  Explanation;Demonstration    Comprehension  Verbalized understanding;Returned demonstration       OT  Short Term Goals - 06/06/18 1305      OT SHORT TERM GOAL #1   Title  Pt to be independent in HEP to tolerate serial cast to increase extention on R 5th PIP ,and using splint and ROM to increase ROM in L 4th PIP     Baseline  tolerating it and showing progress - to cont another 5 wks     Time  5    Period  Weeks    Status  On-going    Target Date  07/11/18      OT SHORT TERM GOAL #2   Title  Pain on PRWHE improve with more than 10  points     Baseline  pain on Aug and Sept was 43/50 on PRWHE  and now 27/50 (month ago) - and now 22/50    Time  3    Period  Weeks    Status  On-going    Target Date  06/27/18        OT Long Term Goals - 06/06/18 1307      OT LONG TERM GOAL #1   Title  R 5th PIP extention  increase with more than 25 degrees to wash face, get gloves on and hand in pocket    Baseline  progressing very wel - extention AROM coming out of cast -55 but cannot maintain - and PROM -45 - -progressing with donning gloves, washing face - but still hard to get hand in pocket on the R     Time  5    Period  Weeks    Status  On-going    Target Date  07/11/18      OT LONG TERM GOAL #2   Title  Function score on PRWHE improve for pt to use hand more than 10 points     Baseline  at eval PRWHE function score was 17 06/12/48  - to carry more than 8 lbs, hold broom , get gloves on- still impaired by cast and edema/pain    Time  5    Period  Weeks  Status  On-going    Target Date  07/11/18            Plan - 06/06/18 1253    Clinical Impression Statement  Pt able to keep cast on weekly and making progress from in Nov 5th PIP extention on R 5th was -75 to -80 and this date -55 AROM , PROM -45- able to cast her this date between -6345 to -3150 - without pain - pt cont to take 2 meds for her arthritis to see if can help for joint swelling and pain -L 4th PIP extnetion worse this date -40 but able to get her after session to -25 - cont per MD order 1 x wk for 5 more weeks of serial  casting     Occupational performance deficits (Please refer to evaluation for details):  ADL's;IADL's;Rest and Sleep;Play;Leisure;Social Participation    Rehab Potential  Fair    Current Impairments/barriers affecting progress:  Pt 4 wks s/p - flexion contracture increasing over months - had pain in beginning could not do serial cast -     OT Frequency  1x / week    OT Duration  --   5wks   OT Treatment/Interventions  Self-care/ADL training;Therapeutic exercise;Patient/family education;Splinting;Paraffin;Manual Therapy;Passive range of motion;Scar mobilization    Plan  assess PIP extention and cast again     Clinical Decision Making  Several treatment options, min-mod task modification necessary    OT Home Exercise Plan  see pt instruction    Consulted and Agree with Plan of Care  Patient       Patient will benefit from skilled therapeutic intervention in order to improve the following deficits and impairments:  Pain, Impaired flexibility, Decreased coordination, Decreased strength, Decreased range of motion, Impaired UE functional use  Visit Diagnosis: Stiffness of right hand, not elsewhere classified  Stiffness of left hand, not elsewhere classified  Pain in finger of both hands  Scar condition and fibrosis of skin  Muscle weakness (generalized)  Localized edema    Problem List Patient Active Problem List   Diagnosis Date Noted  . Encounter for gynecological examination without abnormal finding 04/06/2015  . Mixed incontinence 04/06/2015  . 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 Bursitis of shoulder 12/29/2014  . Tendinitis of right shoulder 12/29/2014  . Other shoulder lesions, right shoulder 12/29/2014  . Episode of syncope 05/11/2014  . Atrial flutter (HCC) 09/05/2012  . CFIDS (chronic fatigue and immune dysfunction syndrome) (HCC) 09/05/2012  . 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 Postural orthostatic tachycardia syndrome 08/30/2011  . Fast heart beat  08/30/2011  . Supraventricular arrhythmia 05/28/2011  . UNSPECIFIED MYOCARDITIS 07/20/2010  . SINUS TACHYCARDIA 07/18/2010  . DYSAUTONOMIA 07/18/2010  . PALPITATIONS, RECURRENT 07/18/2010  . DYSPNEA ON EXERTION 07/18/2010  . 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    Oletta CohnuPreez, Kaya Klausing OTR/L,CLT 06/06/2018, 1:09 PM  [place] [facility] [address] [place], [place], [address] Phone: [phone]   Fax:  [phone]  Name: Dion SaucierCarly Melissa Brun MRN: [id number] Date of Birth: [birth year]

## 2018-06-13 ENCOUNTER — Ambulatory Visit: Payer: Medicaid Other | Admitting: Occupational Therapy

## 2018-06-16 ENCOUNTER — Ambulatory Visit: Payer: Medicaid Other | Attending: Orthopedic Surgery | Admitting: Occupational Therapy

## 2018-06-16 DIAGNOSIS — M6281 Muscle weakness (generalized): Secondary | ICD-10-CM | POA: Insufficient documentation

## 2018-06-16 DIAGNOSIS — L905 Scar conditions and fibrosis of skin: Secondary | ICD-10-CM | POA: Diagnosis present

## 2018-06-16 DIAGNOSIS — M79645 Pain in left finger(s): Secondary | ICD-10-CM | POA: Insufficient documentation

## 2018-06-16 DIAGNOSIS — M79644 Pain in right finger(s): Secondary | ICD-10-CM | POA: Diagnosis present

## 2018-06-16 DIAGNOSIS — M25642 Stiffness of left hand, not elsewhere classified: Secondary | ICD-10-CM | POA: Insufficient documentation

## 2018-06-16 DIAGNOSIS — M25641 Stiffness of right hand, not elsewhere classified: Secondary | ICD-10-CM | POA: Insufficient documentation

## 2018-06-16 NOTE — Therapy (Signed)
[place] [facility] [address] [place], [address] Phone: [phone]   Fax:  [phone]  Occupational Therapy Treatment  Patient Details  Name: Lisa Clay MRN: [id number] Date of Birth: [birth year] Referring Provider (OT): Grant Fontana   Encounter Date: 06/16/2018  OT End of Session - 06/16/18 1555    Visit Number  6    Number of Visits  10    Date for OT Re-Evaluation  07/11/18    OT Start Time  1500    OT Stop Time  1545    OT Time Calculation (min)  45 min    Activity Tolerance  Patient tolerated treatment well    Behavior During Therapy  [name] for tasks assessed/performed       Past Medical History:  Diagnosis Date  . Atrial tachycardia (HCC)   . Dysrhythmia    Hx: PSVT. Current: Postural Orthostatic Tachycardia Syndrome, Inappropriate Sinus Node Tachycardia  . Headache    migraines, rare.  . Kidney stones   . Palpitations    [facility] 2/12  . Shortness of breath dyspnea    on exertion, secondary to heart issues  . Sinus tachycardia    [facility] 2/12  . Syncope    secondary to heart issues    Past Surgical History:  Procedure Laterality Date  . BREAST BIOPSY Left 05/11/2013   stereo biopsy  . CARDIAC ELECTROPHYSIOLOGY STUDY AND ABLATION  04/24/11   [facility]  . CLOSED MANIPULATION SHOULDER WITH STERIOD INJECTION Right 02/16/2015   Procedure: CLOSED MANIPULATION SHOULDER WITH STEROID INJECTION;  Surgeon: Christena Flake, MD;  Location: [facility];  Service: Orthopedics;  Laterality: Right;  . FINGER SURGERY Right 12/25/2017   explorative surgery on R5th PIP   . TONSILLECTOMY      There were no vitals filed for this visit.  Subjective Assessment - 06/16/18 1551    Subjective   I should have taken the cast off and called you - but I kept it on until Thursday - and then I had little ulcer on top of joint - looking better but still there today - but I tried and keep it more  straight since then - little sore but no drainage     Patient Stated Goals  I just want the pain , swelling and motion  better and the use of my hands - and to know what causing it     Currently in Pain?  Yes    Pain Score  3     Pain Location  Finger (Comment which one)    Pain Orientation  Right    Pain Descriptors / Indicators  Aching;Sore    Pain Type  Chronic pain    Pain Onset  1 to 4 weeks ago      PIP of 5th PROM -45 in session , AROM -50 - after joint mobs and graston tool nr 2 brushing over volar 5th  Did not heat this date because of ulcer but no drainage and small on dorsal PIP  pt is ex ER nsg - to get ulcer healed and reassess in week    Pt to Use heatingpad if can with LMB splint at home - but no pressure on dorsal PIP Can do some massage on volar 5th digit  Buddy strap use if can - fitted and ed pt on using it some during day  And alluminum splint with bandage or padding inbetween  - she made it  and apply not to much pressure on ulcer                     OT Education - 06/16/18 1555    Education Details  need to heal ulcer and keep extention she gaines with splint and buddy strap    Person(s) Educated  Patient    Methods  Explanation;Demonstration    Comprehension  Verbalized understanding;Returned demonstration       OT Short Term Goals - 06/06/18 1305      OT SHORT TERM GOAL #1   Title  Pt to be independent in HEP to tolerate serial cast to increase extention on R 5th PIP ,and using splint and ROM to increase ROM in L 4th PIP     Baseline  tolerating it and showing progress - to cont another 5 wks     Time  5    Period  Weeks    Status  On-going    Target Date  07/11/18      OT SHORT TERM GOAL #2   Title  Pain on PRWHE improve with more than 10  points     Baseline  pain on Aug and Sept was 43/50 on PRWHE  and now 27/50 (month ago) - and now 22/50    Time  3    Period  Weeks    Status  On-going    Target Date  06/27/18        OT  Long Term Goals - 06/06/18 1307      OT LONG TERM GOAL #1   Title  R 5th PIP extention  increase with more than 25 degrees to wash face, get gloves on and hand in pocket    Baseline  progressing very wel - extention AROM coming out of cast -55 but cannot maintain - and PROM -45 - -progressing with donning gloves, washing face - but still hard to get hand in pocket on the R     Time  5    Period  Weeks    Status  On-going    Target Date  07/11/18      OT LONG TERM GOAL #2   Title  Function score on PRWHE improve for pt to use hand more than 10 points     Baseline  at eval PRWHE function score was 17 06/12/48  - to carry more than 8 lbs, hold broom , get gloves on- still impaired by cast and edema/pain    Time  5    Period  Weeks    Status  On-going    Target Date  07/11/18            Plan - 06/16/18 1556    Clinical Impression Statement  Pt developed with last cast ulcer on dorsal PIP - healing well - per pt when she took it off last Thursday - kept it best she can extended since then - pt was ER nsg - she still had this date -55 coming in , in session AROM after joint mobs and PROM -50 and PROM -45 - pt to maintain ROM and heal ulcer for week and will reassess if can cast again this coming MOnday     Occupational performance deficits (Please refer to evaluation for details):  ADL's;IADL's;Rest and Sleep;Play;Leisure;Social Participation    Rehab Potential  Fair    Current Impairments/barriers affecting progress:  Pt 4 wks s/p - flexion contracture increasing over months - had pain in beginning  could not do serial cast -     OT Frequency  1x / week    OT Duration  --   3 wks    OT Treatment/Interventions  Self-care/ADL training;Therapeutic exercise;Patient/family education;Splinting;Paraffin;Manual Therapy;Passive range of motion;Scar mobilization    Plan  assess PIP extention and cast again if ulcer healed     Clinical Decision Making  Several treatment options, min-mod task  modification necessary    OT Home Exercise Plan  see pt instruction    Consulted and Agree with Plan of Care  Patient       Patient will benefit from skilled therapeutic intervention in order to improve the following deficits and impairments:  Pain, Impaired flexibility, Decreased coordination, Decreased strength, Decreased range of motion, Impaired UE functional use  Visit Diagnosis: Stiffness of right hand, not elsewhere classified  Stiffness of left hand, not elsewhere classified  Pain in finger of both hands  Scar condition and fibrosis of skin    Problem List Patient Active Problem List   Diagnosis Date Noted  . Encounter for gynecological examination without abnormal finding 04/06/2015  . Mixed incontinence 04/06/2015  . 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 Bursitis of shoulder 12/29/2014  . Tendinitis of right shoulder 12/29/2014  . Other shoulder lesions, right shoulder 12/29/2014  . Episode of syncope 05/11/2014  . Atrial flutter (HCC) 09/05/2012  . CFIDS (chronic fatigue and immune dysfunction syndrome) (HCC) 09/05/2012  . 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 Postural orthostatic tachycardia syndrome 08/30/2011  . Fast heart beat 08/30/2011  . Supraventricular arrhythmia 05/28/2011  . UNSPECIFIED MYOCARDITIS 07/20/2010  . SINUS TACHYCARDIA 07/18/2010  . DYSAUTONOMIA 07/18/2010  . PALPITATIONS, RECURRENT 07/18/2010  . DYSPNEA ON EXERTION 07/18/2010  . 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    Oletta Cohn OTR/L,CLT 06/16/2018, 3:59 PM  [place] [facility] [address], [place], [address] Phone: [phone]   Fax:  [phone]  Name: Lisa Clay MRN: [id number] Date of Birth: [birth year]

## 2018-06-16 NOTE — Patient Instructions (Signed)
Use heatingpad if can with LMB splint - but no pressure on dorsal PIP Can do some massage on volar 5th digit  Buddy strap use if can  And alluminum splint with bandage or padding - she made to wear

## 2018-06-23 ENCOUNTER — Encounter: Payer: Medicaid Other | Admitting: Occupational Therapy

## 2018-06-27 ENCOUNTER — Ambulatory Visit: Payer: Medicaid Other | Admitting: Occupational Therapy

## 2018-06-27 DIAGNOSIS — M6281 Muscle weakness (generalized): Secondary | ICD-10-CM

## 2018-06-27 DIAGNOSIS — L905 Scar conditions and fibrosis of skin: Secondary | ICD-10-CM

## 2018-06-27 DIAGNOSIS — M25641 Stiffness of right hand, not elsewhere classified: Secondary | ICD-10-CM | POA: Diagnosis not present

## 2018-06-27 DIAGNOSIS — M25642 Stiffness of left hand, not elsewhere classified: Secondary | ICD-10-CM

## 2018-06-27 DIAGNOSIS — M79645 Pain in left finger(s): Secondary | ICD-10-CM

## 2018-06-27 DIAGNOSIS — M79644 Pain in right finger(s): Secondary | ICD-10-CM

## 2018-06-27 NOTE — Patient Instructions (Signed)
Made clamshell cast - for pt to be able to remove if needed and put mold skin in it for padding where blister was

## 2018-06-27 NOTE — Therapy (Signed)
[place] [facility] PHYSICAL AND SPORTS MEDICINE [address] [place], [address] Phone: [phone]   Fax:  [phone]  Occupational Therapy Treatment  Patient Details  Name: Marieta Dimambro MRN: [id number] Date of Birth: May 26, 1971 Referring Provider (OT): Grant Fontana   Encounter Date: 06/27/2018  OT End of Session - 06/27/18 1506    Visit Number  7    Number of Visits  10    Date for OT Re-Evaluation  07/11/18    Authorization - Visit Number  2    Authorization - Number of Visits  3    OT Start Time  1132    OT Stop Time  1230    OT Time Calculation (min)  58 min    Activity Tolerance  Patient tolerated treatment well    Behavior During Therapy  [facility] for tasks assessed/performed       Past Medical History:  Diagnosis Date  . Atrial tachycardia (HCC)   . Dysrhythmia    Hx: PSVT. Current: Postural Orthostatic Tachycardia Syndrome, Inappropriate Sinus Node Tachycardia  . Headache    migraines, rare.  . Kidney stones   . Palpitations    [facility] 2/12  . Shortness of breath dyspnea    on exertion, secondary to heart issues  . Sinus tachycardia    [facility] 2/12  . Syncope    secondary to heart issues    Past Surgical History:  Procedure Laterality Date  . BREAST BIOPSY Left 05/11/2013   stereo biopsy  . CARDIAC ELECTROPHYSIOLOGY STUDY AND ABLATION  04/24/11   [facility]  . CLOSED MANIPULATION SHOULDER WITH STERIOD INJECTION Right 02/16/2015   Procedure: CLOSED MANIPULATION SHOULDER WITH STEROID INJECTION;  Surgeon: Christena Flake, MD;  Location: [facility];  Service: Orthopedics;  Laterality: Right;  . FINGER SURGERY Right 12/25/2017   explorative surgery on R5th PIP   . TONSILLECTOMY      There were no vitals filed for this visit.  Subjective Assessment - 06/27/18 1503    Subjective   The blister is doing well - no scab - just still tender - I tried to maintain the progress  we made but I think I lost some  - was just busy     Patient Stated Goals  I just want the pain , swelling and motion  better and the use of my hands - and to know what causing it     Currently in Pain?  Yes    Pain Score  3     Pain Location  Finger (Comment which one)    Pain Orientation  Right    Pain Descriptors / Indicators  Aching;Sore;Tender    Pain Type  Chronic pain    Pain Onset  More than a month ago         [facility] OT Assessment - 06/27/18 0001      Right Hand AROM   R Little PIP 0-100  --   -55(-70 AROM)            LMB splint on while using heating pad-hold off still on paraffin - ulcer on PIP joint healed but skin still tender   OT Treatments/Exercises (OP) - 06/27/18 0001      Moist Heat Therapy   Number Minutes Moist Heat  8 Minutes    Moist Heat Location  Hand   done with LMB splint on for PIP extentoin prior to cast         Graston tool  brushing tech with tool nr 2 on volar R 5th  in palm  Come in with worse extention this date because of unable to cast for the last 2 wks because of ulcer she got on dorsal PIP - but healed this date but still tender   With PROM and stretch - combination with massage  Done gentle traction and joint mobs on PIP     Did gain PROM for R 5th PIP extention -55 and AROM -70 Serial castredone this date onR 5th PIP - using orthocast - but cut open to use as clamshell - with applied moldskin inside for padding  For pt to wear to tolerance and able to take off if issues with pressure area on dorsal PIP  pt ed on precautions again  Pt verbalize understanding - and to keep it dry       OT Education - 06/27/18 1505    Education Details  pt to use clamshell splint to tolerance     Person(s) Educated  Patient    Methods  Explanation;Demonstration    Comprehension  Verbalized understanding;Returned demonstration       OT Short Term Goals - 06/06/18 1305      OT SHORT TERM GOAL #1   Title  Pt to be independent in HEP to tolerate serial cast to  increase extention on R 5th PIP ,and using splint and ROM to increase ROM in L 4th PIP     Baseline  tolerating it and showing progress - to cont another 5 wks     Time  5    Period  Weeks    Status  On-going    Target Date  07/11/18      OT SHORT TERM GOAL #2   Title  Pain on PRWHE improve with more than 10  points     Baseline  pain on Aug and Sept was 43/50 on PRWHE  and now 27/50 (month ago) - and now 22/50    Time  3    Period  Weeks    Status  On-going    Target Date  06/27/18        OT Long Term Goals - 06/06/18 1307      OT LONG TERM GOAL #1   Title  R 5th PIP extention  increase with more than 25 degrees to wash face, get gloves on and hand in pocket    Baseline  progressing very wel - extention AROM coming out of cast -55 but cannot maintain - and PROM -45 - -progressing with donning gloves, washing face - but still hard to get hand in pocket on the R     Time  5    Period  Weeks    Status  On-going    Target Date  07/11/18      OT LONG TERM GOAL #2   Title  Function score on PRWHE improve for pt to use hand more than 10 points     Baseline  at eval PRWHE function score was 17 06/12/48  - to carry more than 8 lbs, hold broom , get gloves on- still impaired by cast and edema/pain    Time  5    Period  Weeks    Status  On-going    Target Date  07/11/18            Plan - 06/27/18 1506    Clinical Impression Statement  Pt develop ulcer about 2 wks ago on dorsal PIP joint -  pt had to use other splints to try and maintain gains made the last 2 wks - pt return this date with ulcer healed but still tender - did this date clamshell cast with moldskin for pt to use  to tolerance  - pt did loose some of her gains she made - will check on pt next week and pt to follow up with ortho in 2 wks     Occupational performance deficits (Please refer to evaluation for details):  ADL's;IADL's;Rest and Sleep;Play;Leisure;Social Participation    Rehab Potential  Fair    Current  Impairments/barriers affecting progress:  Pt 4 wks s/p - flexion contracture increasing over months - had pain in beginning could not do serial cast -     OT Frequency  1x / week    OT Duration  2 weeks    OT Treatment/Interventions  Self-care/ADL training;Therapeutic exercise;Patient/family education;Splinting;Paraffin;Manual Therapy;Passive range of motion;Scar mobilization    Plan  assess PIP extention  with use of clamshell splint/cast - and pressure area     Clinical Decision Making  Several treatment options, min-mod task modification necessary    OT Home Exercise Plan  see pt instruction    Consulted and Agree with Plan of Care  Patient       Patient will benefit from skilled therapeutic intervention in order to improve the following deficits and impairments:  Pain, Impaired flexibility, Decreased coordination, Decreased strength, Decreased range of motion, Impaired UE functional use  Visit Diagnosis: Stiffness of right hand, not elsewhere classified  Stiffness of left hand, not elsewhere classified  Pain in finger of both hands  Scar condition and fibrosis of skin  Muscle weakness (generalized)    Problem List Patient Active Problem List   Diagnosis Date Noted  . Encounter for gynecological examination without abnormal finding 04/06/2015  . Mixed incontinence 04/06/2015  . 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 Bursitis of shoulder 12/29/2014  . Tendinitis of right shoulder 12/29/2014  . Other shoulder lesions, right shoulder 12/29/2014  . Episode of syncope 05/11/2014  . Atrial flutter (HCC) 09/05/2012  . CFIDS (chronic fatigue and immune dysfunction syndrome) (HCC) 09/05/2012  . 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 Postural orthostatic tachycardia syndrome 08/30/2011  . Fast heart beat 08/30/2011  . Supraventricular arrhythmia 05/28/2011  . UNSPECIFIED MYOCARDITIS 07/20/2010  . SINUS TACHYCARDIA 07/18/2010  . DYSAUTONOMIA 07/18/2010  . PALPITATIONS,  RECURRENT 07/18/2010  . DYSPNEA ON EXERTION 07/18/2010  . 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    Oletta Cohn OTR/L,CLT 06/27/2018, 3:10 PM  [place] [facility] PHYSICAL AND SPORTS MEDICINE [address], [place], [address] Phone: [phone]   Fax:  [phone]  Name: Shulamit Callihan MRN: [id number] Date of Birth: 29-Dec-1970

## 2018-07-03 ENCOUNTER — Ambulatory Visit: Payer: Medicaid Other | Admitting: Occupational Therapy

## 2018-07-03 DIAGNOSIS — M79644 Pain in right finger(s): Secondary | ICD-10-CM

## 2018-07-03 DIAGNOSIS — M25642 Stiffness of left hand, not elsewhere classified: Secondary | ICD-10-CM

## 2018-07-03 DIAGNOSIS — M25641 Stiffness of right hand, not elsewhere classified: Secondary | ICD-10-CM

## 2018-07-03 DIAGNOSIS — M79645 Pain in left finger(s): Secondary | ICD-10-CM

## 2018-07-03 DIAGNOSIS — L905 Scar conditions and fibrosis of skin: Secondary | ICD-10-CM

## 2018-07-03 NOTE — Therapy (Signed)
[place] [facility] PHYSICAL AND SPORTS MEDICINE [address], [place], [address] Phone: [phone]   Fax:  [phone]  Occupational Therapy Treatment  Patient Details  Name: Lisa Clay MRN: [id number] Date of Birth: Jun 15, 1970 Referring Provider (OT): Grant Fontana   Encounter Date: 07/03/2018  OT End of Session - 07/03/18 2028    Visit Number  8    Number of Visits  10    Date for OT Re-Evaluation  07/11/18    Authorization - Visit Number  3    Authorization - Number of Visits  3    OT Start Time  1718    OT Stop Time  1815    OT Time Calculation (min)  57 min    Activity Tolerance  Patient tolerated treatment well    Behavior During Therapy  [facility] for tasks assessed/performed       Past Medical History:  Diagnosis Date  . Atrial tachycardia (HCC)   . Dysrhythmia    Hx: PSVT. Current: Postural Orthostatic Tachycardia Syndrome, Inappropriate Sinus Node Tachycardia  . Headache    migraines, rare.  . Kidney stones   . Palpitations    [facility] 2/12  . Shortness of breath dyspnea    on exertion, secondary to heart issues  . Sinus tachycardia    [facility] 2/12  . Syncope    secondary to heart issues    Past Surgical History:  Procedure Laterality Date  . BREAST BIOPSY Left 05/11/2013   stereo biopsy  . CARDIAC ELECTROPHYSIOLOGY STUDY AND ABLATION  04/24/11   [facility]  . CLOSED MANIPULATION SHOULDER WITH STERIOD INJECTION Right 02/16/2015   Procedure: CLOSED MANIPULATION SHOULDER WITH STEROID INJECTION;  Surgeon: Christena Flake, MD;  Location: [facility];  Service: Orthopedics;  Laterality: Right;  . FINGER SURGERY Right 12/25/2017   explorative surgery on R5th PIP   . TONSILLECTOMY      There were no vitals filed for this visit.  Subjective Assessment - 07/03/18 2024    Subjective   I wore the split for shorter time - feels like my circulation in finger is worse - even with spring splint - I did take 2  orders for cookies to be done early Fer     Patient Stated Goals  I just want the pain , swelling and motion  better and the use of my hands - and to know what causing it     Currently in Pain?  Yes    Pain Score  3     Pain Location  Finger (Comment which one)    Pain Orientation  Right;Left    Pain Descriptors / Indicators  Aching;Tightness    Pain Type  Chronic pain    Aggravating Factors   with making fist                 LMB splint on while using heating pad-hold off still on paraffin - ulcer on PIP still thin skin    OT Treatments/Exercises (OP) - 07/03/18 0001      Moist Heat Therapy   Number Minutes Moist Heat  8 Minutes    Moist Heat Location  --   LMB splint on 5th PIP prior to soft tissue       Graston tool brushing tech with tool nr 2 on volar R 5th   and in palm  Come in with extention  gained back to where she was 5 wks ago - when she seen  the Md -  because of unable to cast for the last 3 wks because of ulcer she got on dorsal PIP over New Years - but healed this date but still tender   With PROMand stretch - combination with massage  Done gentle traction and joint mobs on PIP    Did gain PROM for R 5th PIP extention -45 and AROM -65 Done last week Serial castonR 5th PIP - using orthocast - but cut open to use as clamshell - with applied moldskin inside for padding - but pt show circulation issues - cannot keep it long on  Provided this date as other options for padding silicon sleeve and quilt batting  For pt to wear to tolerance and able to take off if issues with pressure area on dorsal PIP  pt ed on precautions again  Pt verbalize understanding - and to keep it dry     Pt to wear clam shell ortho cast - but provided pt with padding this date for dorsal 5th PIP -or mold skin provided for another option and silicon sleeve   but ed on precautions for skin integrity and circulation  To wear as much as she can tolerate and follow up with MD  next week      OT Education - 07/03/18 2028    Education Details  pt to use clamshell splint to tolerance -precautions ed on     Person(s) Educated  Patient    Methods  Explanation;Demonstration    Comprehension  Verbalized understanding;Returned demonstration       OT Short Term Goals - 07/03/18 2034      OT SHORT TERM GOAL #1   Title  Pt to be independent in HEP to tolerate serial cast to increase extention on R 5th PIP ,and using splint and ROM to increase ROM in L 4th PIP     Baseline  develop ulcer on dorsal 5th PIP over new years - could not tolerate any pressure for while - now this past week clam shell with padding     Time  5    Period  Weeks    Status  On-going    Target Date  08/07/18      OT SHORT TERM GOAL #2   Title  Pain on PRWHE improve with more than 10  points     Baseline  pain on Aug and Sept was 43/50 on PRWHE  and now 27/50 (month ago) - and now 22/50    Time  3    Period  Weeks    Status  On-going    Target Date  07/24/18        OT Long Term Goals - 07/03/18 2036      OT LONG TERM GOAL #1   Title  R 5th PIP extention  increase with more than 25 degrees to wash face, get gloves on and hand in pocket    Baseline  same as last seen middle Dec by MD - because of ulcer on PIP joint -and could not tolerate any pressure or casting - -45PROM , AROM -65    Time  6    Period  Weeks    Status  On-going    Target Date  08/14/18      OT LONG TERM GOAL #2   Title  Function score on PRWHE improve for pt to use hand more than 10 points     Baseline  at eval PRWHE function score was 17 06/12/48  -  to carry more than 8 lbs, hold broom , get gloves on- still impaired by cast and edema/pain    Time  4    Period  Weeks    Status  On-going    Target Date  07/31/18            Plan - 07/03/18 2029    Clinical Impression Statement  Pt made progress with casting the first 6 wks after seen Dr Peggye Pittichards - and then also in Dec - but then over New Years pt develop  ulcer or pressure sore over dorsal PIP - and coud not wear anythiing with dorsal pressure for at least 2 wks - did clamshell cast last week with moldskin and pt was able to gain the extention that she had when see MD middle Dec - but skin still tender and pt report and show increase circulation issues with splits - pt has questions about long term expectations - did encourage pt this date to try and 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 - she is on 2 arthritis drugs and to get paraffin bath to use at home when skin integrity on dorsal 5th improve - don't know if pt can look into a silver splint option to wear long term to prevent further flexor contracture - pt to discuss with MD next week     Occupational performance deficits (Please refer to evaluation for details):  ADL's;IADL's;Rest and Sleep;Play;Leisure;Social Participation    Rehab Potential  Fair    OT Frequency  1x / week    OT Duration  6 weeks    OT Treatment/Interventions  Self-care/ADL training;Therapeutic exercise;Patient/family education;Splinting;Paraffin;Manual Therapy;Passive range of motion;Scar mobilization    Plan  await results from MD visit - assess long term options - and return back to her act she done prior to impairment    Clinical Decision Making  Several treatment options, min-mod task modification necessary    OT Home Exercise Plan  see pt instruction    Consulted and Agree with Plan of Care  Patient       Patient will benefit from skilled therapeutic intervention in order to improve the following deficits and impairments:  Pain, Impaired flexibility, Decreased coordination, Decreased strength, Decreased range of motion, Impaired UE functional use  Visit Diagnosis: Stiffness of right hand, not elsewhere classified  Stiffness of left hand, not elsewhere classified  Pain in finger of both hands  Scar condition and fibrosis of skin    Problem List Patient Active Problem List   Diagnosis Date Noted   . Encounter for gynecological examination without abnormal finding 04/06/2015  . Mixed incontinence 04/06/2015  . 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 Bursitis of shoulder 12/29/2014  . Tendinitis of right shoulder 12/29/2014  . Other shoulder lesions, right shoulder 12/29/2014  . Episode of syncope 05/11/2014  . Atrial flutter (HCC) 09/05/2012  . CFIDS (chronic fatigue and immune dysfunction syndrome) (HCC) 09/05/2012  . 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 Postural orthostatic tachycardia syndrome 08/30/2011  . Fast heart beat 08/30/2011  . Supraventricular arrhythmia 05/28/2011  . UNSPECIFIED MYOCARDITIS 07/20/2010  . SINUS TACHYCARDIA 07/18/2010  . DYSAUTONOMIA 07/18/2010  . PALPITATIONS, RECURRENT 07/18/2010  . DYSPNEA ON EXERTION 07/18/2010  . 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    Oletta CohnuPreez, Celeste Candelas OTR/L,CLT 07/03/2018, 8:43 PM  [place] [facility] [address] [place], [place], [id number] Phone: [phone]   Fax:  [phone]  Name: Lisa Clay MRN: [id number] Date of  Birth: 1971-02-08

## 2018-07-03 NOTE — Patient Instructions (Signed)
Pt to wear clam shell ortho cast - but provided pt with padding this date for dorsal 5th PIP -or mold skin provided for another option and silicon sleeve   but ed on precautions for skin integrity and circulation  To wear as much as she can tolerate and follow up with MD next week

## 2018-07-11 ENCOUNTER — Ambulatory Visit: Payer: Medicaid Other | Admitting: Occupational Therapy

## 2018-08-07 ENCOUNTER — Encounter: Payer: Self-pay | Admitting: Occupational Therapy

## 2018-08-07 ENCOUNTER — Other Ambulatory Visit: Payer: Self-pay

## 2018-08-07 ENCOUNTER — Ambulatory Visit: Payer: Medicaid Other | Attending: Orthopedic Surgery | Admitting: Occupational Therapy

## 2018-08-07 DIAGNOSIS — M20021 Boutonniere deformity of right finger(s): Secondary | ICD-10-CM | POA: Insufficient documentation

## 2018-08-07 DIAGNOSIS — M25641 Stiffness of right hand, not elsewhere classified: Secondary | ICD-10-CM

## 2018-08-07 NOTE — Patient Instructions (Signed)
Pt to follow instructions for rehab  Of [employer]  Replace rubber band daily -and use light one as long as she show progress in PIP extention and no increase pain and edema  4-5 x day perform un resisted flexion exercises  And take off for washing hands and shower and wear close to full time as she can   Pt did show this date MP joint hyper extention and apply MP flexion strap - to keep MP joint in slight flexion to put turque at PIP   pt show under standing

## 2018-08-07 NOTE — Therapy (Signed)
[place] [facility] [address], [place], [address] Phone: [phone]   Fax:  [phone]  Occupational Therapy Evaluation  Patient Details  Name: Lisa Clay MRN: [id number] Date of Birth: 09-23-1970 Referring Provider (OT): Lujean Amel   Encounter Date: 08/07/2018  OT End of Session - 08/07/18 1855    Visit Number  1    Number of Visits  6    Date for OT Re-Evaluation  09/04/18    OT Start Time  1550    OT Stop Time  1443    OT Time Calculation (min)  1373 min    Activity Tolerance  Patient tolerated treatment well    Behavior During Therapy  [facility] for tasks assessed/performed       Past Medical History:  Diagnosis Date  . Atrial tachycardia ([place])   . Dysrhythmia    Hx: PSVT. Current: Postural Orthostatic Tachycardia Syndrome, Inappropriate Sinus Node Tachycardia  . Headache    migraines, rare.  . Kidney stones   . Palpitations    [facility] 2/12  . Shortness of breath dyspnea    on exertion, secondary to heart issues  . Sinus tachycardia    [facility] 2/12  . Syncope    secondary to heart issues    Past Surgical History:  Procedure Laterality Date  . BREAST BIOPSY Left 05/11/2013   stereo biopsy  . CARDIAC ELECTROPHYSIOLOGY STUDY AND ABLATION  04/24/11   [facility]  . CLOSED MANIPULATION SHOULDER WITH STERIOD INJECTION Right 02/16/2015   Procedure: CLOSED MANIPULATION SHOULDER WITH STEROID INJECTION;  Surgeon: Corky Mull, MD;  Location: [place];  Service: Orthopedics;  Laterality: Right;  . FINGER SURGERY Right 12/25/2017   explorative surgery on R5th PIP   . TONSILLECTOMY      There were no vitals filed for this visit.  Subjective Assessment - 08/07/18 1838    Subjective   So I been wearing this Digit Widget for about week now - changing rubber bands daily and my husband helped me take the to take the connector assembly off the pin block - I hold the pin block - it  feels like I am moving more out of my big knuckle    Patient Stated Goals  ( hope this work that my finger will me straight again - and that I can use it     Pain Score  3     Pain Location  Finger (Comment which one)    Pain Orientation  Right    Pain Descriptors / Indicators  Tender    Pain Type  Surgical pain    Pain Onset  In the past 7 days        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, house  work , fish with her son ,      Right Hand AROM   R Little PIP 0-100  --   -75 ext, flexion 85 - AROM          Pt to follow instructions for rehab  Of Digit WIdget  Replace rubber band daily -and use light one as long as she show progress in PIP extention and no increase pain and edema  4-5 x day perform un resisted flexion exercises  And take off for washing hands and shower and wear close to full time as she can   Pt did show this date MP joint hyper extention and apply MP flexion strap - to keep MP joint in slight flexion to put turque at PIP   pt show under standing                OT Education - 08/07/18 1847    Education Details  use  Digit Widget  and protocol education     Person(s) Educated  Patient    Methods  Explanation;Demonstration    Comprehension  Verbalized understanding;Returned demonstration       OT Short Term Goals - 08/07/18 1903      OT SHORT TERM GOAL #1   Title  Pt to be independent in adjusting rubberbands, doing flexion HEP 4-5x day , keep pain and edema decrease  and use Diigit widget  in combination of  MC block strap     Baseline  Needed some education - knew only about changing rubberbands     Time  3    Period  Weeks    Target Date  08/28/18        OT Long Term Goals - 08/07/18 1905      OT LONG TERM GOAL #1   Title  Pt show increase PIP extention to -30 and flexion to 90 using Digit Widget correctly     Baseline  PIP extention week ago -90 and now this date -53 but had to apply MC block - was getting some hyper extention at [facility]     Time  6    Period  Weeks    Status  New    Target Date  09/18/18            Plan - 08/07/18 1858    Clinical Impression Statement  Lisa Clay is a 48 year old with 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 Patient symptoms began on Father's Day at which time she had a red fifth PIP joint. She was seen by several physicians in [place] during which time she received an MRI where there was concern for possible septic arthritis of the fifth PIP joint. She was seen by an outside orthopedic surgeon who recommended irrigation and debridement. After the surgery she she developed a progressive contracture of the PIP joint. She reports that her PIP joint contracted to approximately 80 degrees. We saw her at the beginning of November and recommended starting serial casting. She has been doing this with a therapist in [place] and has been progressing well. She is a little bit frustrated about her lack of motion in her fingers but otherwise is doing okay.- she had 07/31/2018  surgery  for Digit Widget to increase PIP extention at R 5th - pt refer to OT     Occupational performance deficits (Please  refer to evaluation for details):  ADL's;IADL's;Rest and Sleep;Play;Leisure;Social Participation    Rehab Potential  Fair    OT Frequency  1x / week     OT Duration  6 weeks    OT Treatment/Interventions  Self-care/ADL training;Therapeutic exercise;Patient/family education;Splinting;Paraffin;Manual Therapy;Passive range of motion;Scar mobilization    Plan  weekly progress with extention at PIP - with use of digit Widget    Clinical Decision Making  Several treatment options, min-mod task modification necessary    OT Home Exercise Plan  see pt instruction    Consulted and Agree with Plan of Care  Patient       Patient will benefit from skilled therapeutic intervention in order to improve the following deficits and impairments:  Pain, Impaired flexibility, Decreased coordination, Decreased strength, Decreased range of motion, Impaired UE functional use, Decreased scar mobility, Decreased knowledge of precautions  Visit Diagnosis: Stiffness of right hand, not elsewhere classified  Boutonniere deformity of finger, right    Problem List Patient Active Problem List   Diagnosis Date Noted  . Encounter for gynecological examination without abnormal finding 04/06/2015  . Mixed incontinence 04/06/2015  . 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 Bursitis of shoulder 12/29/2014  . Tendinitis of right shoulder 12/29/2014  . Other shoulder lesions, right shoulder 12/29/2014  . Episode of syncope 05/11/2014  . Atrial flutter ([place]) 09/05/2012  . CFIDS (chronic fatigue and immune dysfunction syndrome) ([place]) 09/05/2012  . 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 Postural orthostatic tachycardia syndrome 08/30/2011  . Fast heart beat 08/30/2011  . Supraventricular arrhythmia 05/28/2011  . UNSPECIFIED MYOCARDITIS 07/20/2010  . SINUS TACHYCARDIA 07/18/2010  . DYSAUTONOMIA 07/18/2010  . PALPITATIONS, RECURRENT 07/18/2010  . DYSPNEA ON EXERTION 07/18/2010  . 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    Rosalyn Gess OTR/L,CLT 08/07/2018, 7:18 PM  [facility] [facility] [address], [place], [address] Phone: [phone]   Fax:  [phone]  Name: Lisa Clay MRN: [id number] Date of Birth: January 12, 1971

## 2018-08-15 ENCOUNTER — Ambulatory Visit: Payer: Medicaid Other | Attending: Orthopedic Surgery | Admitting: Occupational Therapy

## 2018-08-15 DIAGNOSIS — M79645 Pain in left finger(s): Secondary | ICD-10-CM | POA: Insufficient documentation

## 2018-08-15 DIAGNOSIS — M20021 Boutonniere deformity of right finger(s): Secondary | ICD-10-CM | POA: Insufficient documentation

## 2018-08-15 DIAGNOSIS — M25642 Stiffness of left hand, not elsewhere classified: Secondary | ICD-10-CM | POA: Insufficient documentation

## 2018-08-15 DIAGNOSIS — M79644 Pain in right finger(s): Secondary | ICD-10-CM

## 2018-08-15 DIAGNOSIS — G43909 Migraine, unspecified, not intractable, without status migrainosus: Secondary | ICD-10-CM | POA: Insufficient documentation

## 2018-08-15 DIAGNOSIS — M25641 Stiffness of right hand, not elsewhere classified: Secondary | ICD-10-CM | POA: Insufficient documentation

## 2018-08-15 NOTE — Therapy (Signed)
[facility] [address], [place], [address] Phone: [phone]   Fax:  [phone]  Occupational Therapy Screen  Patient Details  Name: Lisa Clay MRN: [id number] Date of Birth: 1971-06-11 Referring Provider (OT): Mercy Riding   Encounter Date: 08/15/2018    Past Medical History:  Diagnosis Date  . Atrial tachycardia (HCC)   . Dysrhythmia    Hx: PSVT. Current: Postural Orthostatic Tachycardia Syndrome, Inappropriate Sinus Node Tachycardia  . Headache    migraines, rare.  . Kidney stones   . Palpitations    [facility] 2/12  . Shortness of breath dyspnea    on exertion, secondary to heart issues  . Sinus tachycardia    [facility] 2/12  . Syncope    secondary to heart issues    Past Surgical History:  Procedure Laterality Date  . BREAST BIOPSY Left 05/11/2013   stereo biopsy  . CARDIAC ELECTROPHYSIOLOGY STUDY AND ABLATION  04/24/11   [facility]  . CLOSED MANIPULATION SHOULDER WITH STERIOD INJECTION Right 02/16/2015   Procedure: CLOSED MANIPULATION SHOULDER WITH STEROID INJECTION;  Surgeon: Christena Flake, MD;  Location: [facility];  Service: Orthopedics;  Laterality: Right;  . FINGER SURGERY Right 12/25/2017   explorative surgery on R5th PIP   . TONSILLECTOMY      There were no vitals filed for this visit.  Subjective Assessment - 08/15/18 1241    Subjective   I seen the PA yesterday and he said that I need to cover my hand when getting it wet - I doing okay - taking if off 5 x day - and doing my exercises - longest I kept it     Patient Stated Goals  ( hope this work that my finger will me straight again - and that I can use it     Currently in Pain?  Yes    Pain Score  4     Pain Location  Finger (Comment which one)    Pain Orientation  Right;Left    Pain Descriptors / Indicators  Aching;Tender    Pain Type  Surgical pain;Chronic pain         [facility] OT Assessment -  08/15/18 0001      Right Hand AROM   R Ring  MCP 0-90  100 Degrees    R Ring PIP 0-100  90 Degrees   -15   R Ring DIP 0-70  --   no DIP    R Little PIP 0-100  --   -65AROM, PROM -45 and flexion 90     Left Hand AROM   L Ring  MCP 0-90  90 Degrees    L Ring PIP 0-100  80 Degrees   -27     Pt arrive with Digit Widget in place  Seen PA yesterday   Pt cont to  follow instructions for rehab  Of Digit WIdget  Replace rubber band daily -and use light one as long as she show progress in PIP extention and no increase pain and edema  4-5 x day perform un resisted flexion exercises - 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, television/film set with it to maintain straight plain And take off for washing hands and shower and wear close to full time as she can   Pt cont to  show  MP joint hyper extention and  Need to cont to wear  MP flexion strap - to keep MP joint in slight flexion to put  turque at PIP   pt show under standing  And making progress- showed 10 degrees increase of extention  And flexion 90                 OT Short Term Goals - 08/07/18 1903      OT SHORT TERM GOAL #1   Title  Pt to be independent in adjusting rubberbands, doing flexion HEP 4-5x day , keep pain and edema decrease and use Diigit widget  in combination of  MC block strap     Baseline  Needed some education - knew only about changing rubberbands     Time  3    Period  Weeks    Target Date  08/28/18        OT Long Term Goals - 08/07/18 1905      OT LONG TERM GOAL #1   Title  Pt show increase PIP extention to -30 and flexion to 90 using Digit Widget correctly     Baseline  PIP extention week ago -90 and now this date -27 but had to apply MC block - was getting some hyper extention at [facility]     Time  6    Period  Weeks    Status  New    Target Date  09/18/18              Patient will benefit from skilled therapeutic intervention in order to improve the following deficits and impairments:     Visit  Diagnosis: Stiffness of right hand, not elsewhere classified  Pain in finger of both hands    Problem List Patient Active Problem List   Diagnosis Date Noted  . Encounter for gynecological examination without abnormal finding 04/06/2015  . Mixed incontinence 04/06/2015  . 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 Bursitis of shoulder 12/29/2014  . Tendinitis of right shoulder 12/29/2014  . Other shoulder lesions, right shoulder 12/29/2014  . Episode of syncope 05/11/2014  . Atrial flutter (HCC) 09/05/2012  . CFIDS (chronic fatigue and immune dysfunction syndrome) (HCC) 09/05/2012  . 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 Postural orthostatic tachycardia syndrome 08/30/2011  . Fast heart beat 08/30/2011  . Supraventricular arrhythmia 05/28/2011  . UNSPECIFIED MYOCARDITIS 07/20/2010  . SINUS TACHYCARDIA 07/18/2010  . DYSAUTONOMIA 07/18/2010  . PALPITATIONS, RECURRENT 07/18/2010  . DYSPNEA ON EXERTION 07/18/2010  . 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    Lisa Clay 08/15/2018, 1:21 PM  [facility] [address], [place], [address] Phone: [phone]   Fax:  [phone]  Name: Lisa Clay MRN: [id number] Date of Birth: [birth year]

## 2018-08-21 ENCOUNTER — Ambulatory Visit: Payer: Medicaid Other | Admitting: Occupational Therapy

## 2018-08-21 ENCOUNTER — Other Ambulatory Visit: Payer: Self-pay

## 2018-08-21 DIAGNOSIS — M20021 Boutonniere deformity of right finger(s): Secondary | ICD-10-CM | POA: Diagnosis present

## 2018-08-21 DIAGNOSIS — M79645 Pain in left finger(s): Secondary | ICD-10-CM

## 2018-08-21 DIAGNOSIS — M79644 Pain in right finger(s): Secondary | ICD-10-CM

## 2018-08-21 DIAGNOSIS — M25642 Stiffness of left hand, not elsewhere classified: Secondary | ICD-10-CM | POA: Diagnosis present

## 2018-08-21 DIAGNOSIS — M25641 Stiffness of right hand, not elsewhere classified: Secondary | ICD-10-CM

## 2018-08-21 NOTE — Patient Instructions (Signed)
See note

## 2018-08-21 NOTE — Therapy (Signed)
[facility] [address], [place], [address] Phone: [phone]   Fax:  [phone]  Occupational Therapy Treatment  Patient Details  Name: Lisa Clay MRN: [id number] Date of Birth: 1970/11/19 Referring Provider (OT): Mercy Riding   Encounter Date: 08/21/2018  OT End of Session - 08/21/18 2059    Visit Number  2    Number of Visits  6    Date for OT Re-Evaluation  09/04/18    Authorization - Visit Number  1    Authorization - Number of Visits  6    OT Start Time  1301    OT Stop Time  1355    OT Time Calculation (min)  54 min    Activity Tolerance  Patient tolerated treatment well    Behavior During Therapy  [facility] for tasks assessed/performed       Past Medical History:  Diagnosis Date  . Atrial tachycardia (HCC)   . Dysrhythmia    Hx: PSVT. Current: Postural Orthostatic Tachycardia Syndrome, Inappropriate Sinus Node Tachycardia  . Headache    migraines, rare.  . Kidney stones   . Palpitations    [facility] 2/12  . Shortness of breath dyspnea    on exertion, secondary to heart issues  . Sinus tachycardia    [facility] 2/12  . Syncope    secondary to heart issues    Past Surgical History:  Procedure Laterality Date  . BREAST BIOPSY Left 05/11/2013   stereo biopsy  . CARDIAC ELECTROPHYSIOLOGY STUDY AND ABLATION  04/24/11   [facility]  . CLOSED MANIPULATION SHOULDER WITH STERIOD INJECTION Right 02/16/2015   Procedure: CLOSED MANIPULATION SHOULDER WITH STEROID INJECTION;  Surgeon: Christena Flake, MD;  Location: [facility];  Service: Orthopedics;  Laterality: Right;  . FINGER SURGERY Right 12/25/2017   explorative surgery on R5th PIP   . TONSILLECTOMY      There were no vitals filed for this visit.  Subjective Assessment - 08/21/18 1330    Subjective   I am wearing most all the time and try and take if off few times to do active range of motion - can feel pull if I put  the  bands on again after I had it off for while     Patient Stated Goals  ( hope this work that my finger will me straight again - and that I can use it     Currently in Pain?  Yes    Pain Score  2     Pain Location  Finger (Comment which one)    Pain Orientation  Right    Pain Descriptors / Indicators  Aching         [facility] OT Assessment - 08/21/18 0001      Right Hand AROM   R Little PIP 0-100  --   -57 A( -40P); flexion 90 A     Left Hand AROM   L Ring  MCP 0-90  90 Degrees    L Ring PIP 0-100  80 Degrees   -27        Pt arrive with Digit Widget in place   Pt cont to  follow instructions for rehab Of Digit WIdget  Replace rubber band daily -switch her this week to moderate band because of progress was less than 10 degrees  And no increase pain and edema this past week  Pt ed on if increase pain or edema - to try 1/2 light  and 1/2 day moderate band   4-5 x day perform un resisted flexion exercises - 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, television/film set with it to maintain straight plain But also ed on blocking PIP 3 x day to do AROM of PIP flexion and extention  And take off for washing hands and shower and wear close to full time as she can   Pt cont to  show  MP joint hyper extention and  Need to cont to wear  MP flexion strap - to keep MP joint in slight flexion to put turque at PIP  pt show under standing  And making progress- showed 7 degrees increase of extention  And flexion 90  Done paraffin to L hand and graston tool for brushing nr 2 on volar 4th prior to extention and flexion -gain about 5 degrees both ways   pt to cont with joint jack and ROM        OT Treatments/Exercises (OP) - 08/21/18 0001      LUE Paraffin   Number Minutes Paraffin  10 Minutes    LUE Paraffin Location  Hand    Comments  4th PIP LMB splnt of on for extention              OT Education - 08/21/18 2059    Education Details  use  Digit Widget  and protocol education and upgrade to moderate  rubber band     Person(s) Educated  Patient    Methods  Explanation;Demonstration    Comprehension  Verbalized understanding;Returned demonstration       OT Short Term Goals - 08/07/18 1903      OT SHORT TERM GOAL #1   Title  Pt to be independent in adjusting rubberbands, doing flexion HEP 4-5x day , keep pain and edema decrease and use Diigit widget  in combination of  MC block strap     Baseline  Needed some education - knew only about changing rubberbands     Time  3    Period  Weeks    Target Date  08/28/18        OT Long Term Goals - 08/07/18 1905      OT LONG TERM GOAL #1   Title  Pt show increase PIP extention to -30 and flexion to 90 using Digit Widget correctly     Baseline  PIP extention week ago -90 and now this date -40 but had to apply MC block - was getting some hyper extention at [facility]     Time  6    Period  Weeks    Status  New    Target Date  09/18/18            Plan - 08/21/18 2100    Clinical Impression Statement  Pt making progress with use of Digit Widget - pt progress about 17 degrees - of extention and flexion  at 90 - pt this week progress was less than 10 degrees - pt to use moderate rubber band this week - if causing pain try and do 1/2 of time light band and other 1/2  mod band - and cont with MC block strap to prevent hyper extention of MC     Occupational performance deficits (Please refer to evaluation for details):  ADL's;IADL's;Rest and Sleep;Play;Leisure;Social Participation    Rehab Potential  Fair    Clinical Decision Making  Several treatment options, min-mod task modification necessary    OT Frequency  1x / week  OT Duration  6 weeks    OT Treatment/Interventions  Self-care/ADL training;Therapeutic exercise;Patient/family education;Splinting;Paraffin;Manual Therapy;Passive range of motion;Scar mobilization    Plan  weekly progress with extention at PIP - with use of digit Widget    OT Home Exercise Plan  see pt instruction    Consulted  and Agree with Plan of Care  Patient       Patient will benefit from skilled therapeutic intervention in order to improve the following deficits and impairments:     Visit Diagnosis: Stiffness of right hand, not elsewhere classified  Pain in finger of both hands  Boutonniere deformity of finger, right    Problem List Patient Active Problem List   Diagnosis Date Noted  . Encounter for gynecological examination without abnormal finding 04/06/2015  . Mixed incontinence 04/06/2015  . 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 Bursitis of shoulder 12/29/2014  . Tendinitis of right shoulder 12/29/2014  . Other shoulder lesions, right shoulder 12/29/2014  . Episode of syncope 05/11/2014  . Atrial flutter (HCC) 09/05/2012  . CFIDS (chronic fatigue and immune dysfunction syndrome) (HCC) 09/05/2012  . 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 Postural orthostatic tachycardia syndrome 08/30/2011  . Fast heart beat 08/30/2011  . Supraventricular arrhythmia 05/28/2011  . UNSPECIFIED MYOCARDITIS 07/20/2010  . SINUS TACHYCARDIA 07/18/2010  . DYSAUTONOMIA 07/18/2010  . PALPITATIONS, RECURRENT 07/18/2010  . DYSPNEA ON EXERTION 07/18/2010  . 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    Oletta Cohn OTR/L,CLT 08/21/2018, 9:04 PM   [facility] [address] [place], [address] Phone: [phone]   Fax:  [phone]  Name: Lisa Clay MRN: [id number] Date of Birth: 10-Sep-1970

## 2018-08-29 ENCOUNTER — Ambulatory Visit: Payer: Medicaid Other | Admitting: Occupational Therapy

## 2018-09-01 ENCOUNTER — Encounter: Payer: Medicaid Other | Admitting: Occupational Therapy

## 2018-09-03 ENCOUNTER — Other Ambulatory Visit: Payer: Self-pay

## 2018-09-03 ENCOUNTER — Ambulatory Visit: Payer: Medicaid Other | Admitting: Occupational Therapy

## 2018-09-03 DIAGNOSIS — M25641 Stiffness of right hand, not elsewhere classified: Secondary | ICD-10-CM | POA: Diagnosis not present

## 2018-09-03 DIAGNOSIS — M79645 Pain in left finger(s): Secondary | ICD-10-CM

## 2018-09-03 DIAGNOSIS — M25642 Stiffness of left hand, not elsewhere classified: Secondary | ICD-10-CM

## 2018-09-03 DIAGNOSIS — M79644 Pain in right finger(s): Secondary | ICD-10-CM

## 2018-09-03 DIAGNOSIS — M20021 Boutonniere deformity of right finger(s): Secondary | ICD-10-CM

## 2018-09-03 NOTE — Therapy (Signed)
[facility] [address], [place], [address] Phone: [phone]   Fax:  [phone]  Occupational Therapy Treatment  Patient Details  Name: Lisa Clay MRN: [id number] Date of Birth: June 20, 1970 Referring Provider (OT): Mercy Riding   Encounter Date: 09/03/2018  OT End of Session - 09/03/18 1416    Visit Number  3    Number of Visits  6    Date for OT Re-Evaluation  09/04/18    Authorization - Visit Number  2    Authorization - Number of Visits  6    OT Start Time  1302    OT Stop Time  1355    OT Time Calculation (min)  53 min    Activity Tolerance  Patient tolerated treatment well    Behavior During Therapy  [facility] for tasks assessed/performed       Past Medical History:  Diagnosis Date  . Atrial tachycardia (HCC)   . Dysrhythmia    Hx: PSVT. Current: Postural Orthostatic Tachycardia Syndrome, Inappropriate Sinus Node Tachycardia  . Headache    migraines, rare.  . Kidney stones   . Palpitations    [facility] 2/12  . Shortness of breath dyspnea    on exertion, secondary to heart issues  . Sinus tachycardia    [facility] 2/12  . Syncope    secondary to heart issues    Past Surgical History:  Procedure Laterality Date  . BREAST BIOPSY Left 05/11/2013   stereo biopsy  . CARDIAC ELECTROPHYSIOLOGY STUDY AND ABLATION  04/24/11   [facility]  . CLOSED MANIPULATION SHOULDER WITH STERIOD INJECTION Right 02/16/2015   Procedure: CLOSED MANIPULATION SHOULDER WITH STEROID INJECTION;  Surgeon: Christena Flake, MD;  Location: [facility];  Service: Orthopedics;  Laterality: Right;  . FINGER SURGERY Right 12/25/2017   explorative surgery on R5th PIP   . TONSILLECTOMY      There were no vitals filed for this visit.  Subjective Assessment - 09/03/18 1412    Subjective   I forget that I have the rubberband off and then few hrs later I realized it was off- I get busy - but it feels like when I  have it off I can use it little more     Patient Stated Goals  ( hope this work that my finger will me straight again - and that I can use it     Currently in Pain?  Yes    Pain Score  2    Pain at PIP of bilateral 4th digit 10/10    Pain Location  Finger (Comment which one)    Pain Orientation  Right    Pain Descriptors / Indicators  Aching         [facility] OT Assessment - 09/03/18 0001      Right Hand AROM   R Little PIP 0-100  --   AROM -53; PROM -35, flexion 90     Left Hand AROM   L Ring  MCP 0-90  90 Degrees    L Ring PIP 0-100  90 Degrees   -27      Measurements taken - progressing   pt still hyper extending her MC of 5th - reed to use [facility] block strap -but do not pull to tight          OT Treatments/Exercises (OP) - 09/03/18 0001      LUE Paraffin   Number Minutes Paraffin  10 Minutes  LUE Paraffin Location  Hand    Comments  4th PIP with LMB splint on for extention prior to manual        Pt arrive with Digit Widget in place   Pt cont tofollow instructions for rehab Of Digit WIdget  Replace rubber band daily -switch her last  week to moderate band because of progress was less than 10 degrees  And no increase pain and edema this past week  Pt ed on if increase pain or edema - to try 1/2 light and 1/2 day moderate band if needed   5 x day perform un resisted flexion/ extention  exercises- 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, television/film set with it to maintain straight plain- but short periods of functional use too - 15-20 min   But also ed on blocking PIP 5 x day to do AROM of PIP flexion and extention  And take off for washing hands and shower and wear close to full time as she can   Ptcont toshow MP joint hyper extention and Need to cont to wearMP flexion strap - to keep MP joint in slight flexion to put turque at PIP - she reports she was pulling it to tight pt show under standing And making progress- showed 4 degrees increase of extention  AROM  PROM  -35 this date and with rubber band -45  And flexion 90 Done paraffin to L hand and graston tool for brushing nr 2 on volar 4th prior to extention and flexion -gain about 5 degrees both ways   pt to cont with joint jack and ROM      Pt to keep bands off for 15-20 min but more frequent to use digits actively during functional task while able to maintain her extention  Cont pull MC block strap to tight  And do not work flexion with band to increase PIP flexion -     OT Education - 09/03/18 1415    Education Details  explain progress slower now , and work actively extention more frequently     Person(s) Educated  Patient    Methods  Explanation;Demonstration    Comprehension  Verbalized understanding;Returned demonstration       OT Short Term Goals - 08/07/18 1903      OT SHORT TERM GOAL #1   Title  Pt to be independent in adjusting rubberbands, doing flexion HEP 4-5x day , keep pain and edema decrease and use Diigit widget  in combination of  MC block strap     Baseline  Needed some education - knew only about changing rubberbands     Time  3    Period  Weeks    Target Date  08/28/18        OT Long Term Goals - 08/07/18 1905      OT LONG TERM GOAL #1   Title  Pt show increase PIP extention to -30 and flexion to 90 using Digit Widget correctly     Baseline  PIP extention week ago -90 and now this date -39 but had to apply [facility] block - was getting some hyper extention at [facility]     Time  6    Period  Weeks    Status  New    Target Date  09/18/18            Plan - 09/03/18 1416    Clinical Impression Statement  Pt making slow but steady progress with extention of PIP 5th digit - pt  PROM progress to -35 this date , with moderate band -45 and AROM -53 - reinforce to keep band off only shorter periods , but more frequent to use extensor mechanism to maintain her exention - she forgets it is off - cont to have pain with other joints - she thinks it is related with Humira      Occupational performance deficits (Please refer to evaluation for details):  ADL's;IADL's;Rest and Sleep;Play;Leisure;Social Participation    Rehab Potential  Fair    Clinical Decision Making  Several treatment options, min-mod task modification necessary    OT Frequency  1x / week    OT Duration  6 weeks    OT Treatment/Interventions  Self-care/ADL training;Therapeutic exercise;Patient/family education;Splinting;Paraffin;Manual Therapy;Passive range of motion;Scar mobilization    Plan  weekly progress with extention at PIP - with use of digit Widget    OT Home Exercise Plan  see pt instruction    Consulted and Agree with Plan of Care  Patient       Patient will benefit from skilled therapeutic intervention in order to improve the following deficits and impairments:     Visit Diagnosis: Stiffness of right hand, not elsewhere classified  Pain in finger of both hands  Boutonniere deformity of finger, right  Stiffness of left hand, not elsewhere classified    Problem List Patient Active Problem List   Diagnosis Date Noted  . Encounter for gynecological examination without abnormal finding 04/06/2015  . Mixed incontinence 04/06/2015  . 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 Bursitis of shoulder 12/29/2014  . Tendinitis of right shoulder 12/29/2014  . Other shoulder lesions, right shoulder 12/29/2014  . Episode of syncope 05/11/2014  . Atrial flutter (HCC) 09/05/2012  . CFIDS (chronic fatigue and immune dysfunction syndrome) (HCC) 09/05/2012  . 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 Postural orthostatic tachycardia syndrome 08/30/2011  . Fast heart beat 08/30/2011  . Supraventricular arrhythmia 05/28/2011  . UNSPECIFIED MYOCARDITIS 07/20/2010  . SINUS TACHYCARDIA 07/18/2010  . DYSAUTONOMIA 07/18/2010  . PALPITATIONS, RECURRENT 07/18/2010  . DYSPNEA ON EXERTION 07/18/2010  . 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    Oletta Cohn OTR/L,CLT 09/03/2018, 2:20 PM  [facility] [address] [place], [address] Phone: [phone]   Fax:  [phone]  Name: Erini Sarkissian MRN: [id number] Date of Birth: 1971-04-12

## 2018-09-03 NOTE — Patient Instructions (Signed)
Pt to keep bands off for 15-20 min but more frequent to use digits actively during functional task while able to maintain her extention  Cont pull MC block strap to tight  And do not work flexion with band to increase PIP flexion -

## 2018-09-10 ENCOUNTER — Encounter: Payer: Medicaid Other | Admitting: Occupational Therapy

## 2018-10-01 ENCOUNTER — Other Ambulatory Visit: Payer: Self-pay

## 2018-10-01 ENCOUNTER — Ambulatory Visit: Payer: Medicaid Other | Attending: Orthopedic Surgery | Admitting: Occupational Therapy

## 2018-10-01 DIAGNOSIS — M79644 Pain in right finger(s): Secondary | ICD-10-CM | POA: Insufficient documentation

## 2018-10-01 DIAGNOSIS — M20021 Boutonniere deformity of right finger(s): Secondary | ICD-10-CM

## 2018-10-01 DIAGNOSIS — M25642 Stiffness of left hand, not elsewhere classified: Secondary | ICD-10-CM | POA: Insufficient documentation

## 2018-10-01 DIAGNOSIS — M25641 Stiffness of right hand, not elsewhere classified: Secondary | ICD-10-CM | POA: Diagnosis not present

## 2018-10-01 DIAGNOSIS — M79645 Pain in left finger(s): Secondary | ICD-10-CM | POA: Diagnosis present

## 2018-10-01 NOTE — Patient Instructions (Signed)
Same but can do some PROM to 5th PIP

## 2018-10-01 NOTE — Therapy (Signed)
[facility] [address], [place], [address] Phone: [phone]   Fax:  [phone]  Occupational Therapy Treatment  Patient Details  Name: Lisa Clay MRN: [id number] Date of Birth: December 31, 1970 Referring Provider (OT): Mercy Riding   Encounter Date: 10/01/2018  OT End of Session - 10/01/18 1622    Visit Number  4    Number of Visits  6    Date for OT Re-Evaluation  10/15/18    Authorization - Visit Number  3    Authorization - Number of Visits  6    OT Start Time  1505    OT Stop Time  1555    OT Time Calculation (min)  50 min    Activity Tolerance  Patient tolerated treatment well    Behavior During Therapy  [facility] for tasks assessed/performed       Past Medical History:  Diagnosis Date  . Atrial tachycardia (HCC)   . Dysrhythmia    Hx: PSVT. Current: Postural Orthostatic Tachycardia Syndrome, Inappropriate Sinus Node Tachycardia  . Headache    migraines, rare.  . Kidney stones   . Palpitations    [facility] 2/12  . Shortness of breath dyspnea    on exertion, secondary to heart issues  . Sinus tachycardia    [facility] 2/12  . Syncope    secondary to heart issues    Past Surgical History:  Procedure Laterality Date  . BREAST BIOPSY Left 05/11/2013   stereo biopsy  . CARDIAC ELECTROPHYSIOLOGY STUDY AND ABLATION  04/24/11   [facility]  . CLOSED MANIPULATION SHOULDER WITH STERIOD INJECTION Right 02/16/2015   Procedure: CLOSED MANIPULATION SHOULDER WITH STEROID INJECTION;  Surgeon: Christena Flake, MD;  Location: [facility];  Service: Orthopedics;  Laterality: Right;  . FINGER SURGERY Right 12/25/2017   explorative surgery on R5th PIP   . TONSILLECTOMY      There were no vitals filed for this visit.  Subjective Assessment - 10/01/18 1619    Subjective   I seen the surgeon - switch me to white rubberband and appt 5/8  and going to take pins out and send me to get removal cast      Patient Stated Goals  ( hope this work that my finger will me straight again - and that I can use it     Currently in Pain?  Yes    Pain Score  2     Pain Location  Hand    Pain Orientation  Right    Pain Descriptors / Indicators  Aching    Pain Type  Surgical pain;Chronic pain         [facility] OT Assessment - 10/01/18 0001      Right Hand AROM   R Little PIP 0-100  --   -45 AROM , -25 PROM      Left Hand AROM   L Ring  MCP 0-90  90 Degrees    L Ring PIP 0-100  80 Degrees   -25 extention SOC- and in session 90 flexion    L Ring DIP 0-70  60 Degrees   PROM , 50 AROM ins session       Measurements taken - progressing   pt still hyper extending her MC of 5th - reed to use [facility] block strap -but do not pull to tight           OT Treatments/Exercises (OP) - 10/01/18 0001  LUE Paraffin   Number Minutes Paraffin  10 Minutes    LUE Paraffin Location  Hand    Comments  LMB splint on L 4th PIP prior to soft tissue and PROM        Pt arrive with Digit Widget in place   Pt cont tofollow instructions for rehab Of Digit WIdget  Replace rubber band daily - surgeon switch her  on 09/12/2018 to white band - strongest band because of progress was less than 10 degrees and was on mod the 2 wks before that  Edema less this date    5 x day perform un resisted flexion/ extention  exercises- 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, television/film set with it to maintain straight plain- but short periods of functional use too - 15-20 min   But also ed on blocking PIP 5 x day to do AROM of PIP flexion and extention And take off for washing hands and shower and wear close to full time as she can   Ptcont toshow MP joint hyper extention and Need to cont to wearMP flexion strap - to keep MP joint in slight flexion to put turque at PIP - she reports she was pulling it to tight pt show under standing And making progress-   see flowsheet   Done paraffin to L hand and graston tool for brushing  nr 2 on volar 4th prior to extention and flexion  DIP and PIP pt to cont with joint jack and ROM   Pt to keep bands off for 15-20 min but more frequent to use digits actively during functional task while able to maintain her extention          OT Education - 10/01/18 1621    Education Details  explain progress slower now , and work actively extention more frequently     Person(s) Educated  Patient    Methods  Explanation;Demonstration    Comprehension  Verbalized understanding;Returned demonstration       OT Short Term Goals - 10/01/18 1625      OT SHORT TERM GOAL #1   Title  Pt to be independent in adjusting rubberbands, doing flexion HEP 4-5x day , keep pain and edema decrease and use Diigit widget  in combination of  MC block strap     Baseline  Needed some education - knew only about changing rubberbands - using white band now - 3rd resistance band     Time  2    Period  Weeks    Status  On-going    Target Date  10/15/18        OT Long Term Goals - 10/01/18 1625      OT LONG TERM GOAL #1   Title  Pt show increase PIP extention to -30 and flexion to 90 using Digit Widget correctly     Baseline  Flexion this date 70 degrees <AROM -45 , with band -40 and PROM -25 - still getting hyper extention at [facility]     Time  2    Period  Weeks    Status  On-going    Target Date  10/15/18            Plan - 10/01/18 1622    Clinical Impression Statement  Pt making slow but steady progress with extention of R 5th PIP with Digit Widget - surgeon switch her to white bands 2-3 wks ago - and pt show AROM -45 without band, with band -40 and PROM -25  this date - appear much more open when looking at volar PIP fold - to cont with progress , pt also benefitting from soft tissue to L 4th PIP flexion and DIP , exention of PIP     Occupational performance deficits (Please refer to evaluation for details):  ADL's;IADL's;Rest and Sleep;Play;Leisure;Social Participation    Rehab Potential   Fair    Clinical Decision Making  Several treatment options, min-mod task modification necessary    OT Frequency  1x / week    OT Duration  2 weeks    OT Treatment/Interventions  Self-care/ADL training;Therapeutic exercise;Patient/family education;Splinting;Paraffin;Manual Therapy;Passive range of motion;Scar mobilization    Plan  weekly progress with extention at PIP - with use of digit Widget    OT Home Exercise Plan  see pt instruction    Consulted and Agree with Plan of Care  Patient       Patient will benefit from skilled therapeutic intervention in order to improve the following deficits and impairments:     Visit Diagnosis: Stiffness of right hand, not elsewhere classified  Pain in finger of both hands  Boutonniere deformity of finger, right  Stiffness of left hand, not elsewhere classified    Problem List Patient Active Problem List   Diagnosis Date Noted  . Encounter for gynecological examination without abnormal finding 04/06/2015  . Mixed incontinence 04/06/2015  . 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 Bursitis of shoulder 12/29/2014  . Tendinitis of right shoulder 12/29/2014  . Other shoulder lesions, right shoulder 12/29/2014  . Episode of syncope 05/11/2014  . Atrial flutter (HCC) 09/05/2012  . CFIDS (chronic fatigue and immune dysfunction syndrome) (HCC) 09/05/2012  . 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 Postural orthostatic tachycardia syndrome 08/30/2011  . Fast heart beat 08/30/2011  . Supraventricular arrhythmia 05/28/2011  . UNSPECIFIED MYOCARDITIS 07/20/2010  . SINUS TACHYCARDIA 07/18/2010  . DYSAUTONOMIA 07/18/2010  . PALPITATIONS, RECURRENT 07/18/2010  . DYSPNEA ON EXERTION 07/18/2010  . 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    Oletta CohnuPreez, Nashiya Disbrow  OTR/L,CLT 10/01/2018, 4:27 PM  [facility] [address] [place], [place], [id number] Phone: [phone]   Fax:   [phone]  Name: Lisa Clay MRN: [id number] Date of Birth: May 04, 1971

## 2018-10-08 ENCOUNTER — Ambulatory Visit: Payer: Medicaid Other | Admitting: Occupational Therapy

## 2018-10-15 ENCOUNTER — Ambulatory Visit: Payer: Medicaid Other | Attending: Orthopedic Surgery | Admitting: Occupational Therapy

## 2018-10-15 ENCOUNTER — Other Ambulatory Visit: Payer: Self-pay

## 2018-10-15 DIAGNOSIS — M79645 Pain in left finger(s): Secondary | ICD-10-CM | POA: Insufficient documentation

## 2018-10-15 DIAGNOSIS — R600 Localized edema: Secondary | ICD-10-CM | POA: Diagnosis present

## 2018-10-15 DIAGNOSIS — M79644 Pain in right finger(s): Secondary | ICD-10-CM | POA: Insufficient documentation

## 2018-10-15 DIAGNOSIS — L905 Scar conditions and fibrosis of skin: Secondary | ICD-10-CM | POA: Insufficient documentation

## 2018-10-15 DIAGNOSIS — M20021 Boutonniere deformity of right finger(s): Secondary | ICD-10-CM | POA: Insufficient documentation

## 2018-10-15 DIAGNOSIS — M25641 Stiffness of right hand, not elsewhere classified: Secondary | ICD-10-CM | POA: Diagnosis not present

## 2018-10-15 DIAGNOSIS — M25642 Stiffness of left hand, not elsewhere classified: Secondary | ICD-10-CM | POA: Diagnosis present

## 2018-10-15 DIAGNOSIS — M6281 Muscle weakness (generalized): Secondary | ICD-10-CM | POA: Insufficient documentation

## 2018-10-15 NOTE — Therapy (Signed)
[facility] [address], [place], [address] Phone: [phone]   Fax:  [phone]  Occupational Therapy Treatment  Patient Details  Name: Lisa Clay MRN: [id number] Date of Birth: [birth year] Referring Provider (OT): Mercy Riding   Encounter Date: 10/15/2018  OT End of Session - 10/15/18 1602    Visit Number  5    Number of Visits  6    Date for OT Re-Evaluation  10/15/18    Authorization - Visit Number  4    Authorization - Number of Visits  6    OT Start Time  1405    OT Stop Time  1457    OT Time Calculation (min)  52 min    Activity Tolerance  Patient tolerated treatment well    Behavior During Therapy  [facility] for tasks assessed/performed       Past Medical History:  Diagnosis Date  . Atrial tachycardia (HCC)   . Dysrhythmia    Hx: PSVT. Current: Postural Orthostatic Tachycardia Syndrome, Inappropriate Sinus Node Tachycardia  . Headache    migraines, rare.  . Kidney stones   . Palpitations    [facility] 2/12  . Shortness of breath dyspnea    on exertion, secondary to heart issues  . Sinus tachycardia    [facility] 2/12  . Syncope    secondary to heart issues    Past Surgical History:  Procedure Laterality Date  . BREAST BIOPSY Left 05/11/2013   stereo biopsy  . CARDIAC ELECTROPHYSIOLOGY STUDY AND ABLATION  04/24/11   [facility]  . CLOSED MANIPULATION SHOULDER WITH STERIOD INJECTION Right 02/16/2015   Procedure: CLOSED MANIPULATION SHOULDER WITH STEROID INJECTION;  Surgeon: Christena Flake, MD;  Location: [facility];  Service: Orthopedics;  Laterality: Right;  . FINGER SURGERY Right 12/25/2017   explorative surgery on R5th PIP   . TONSILLECTOMY      There were no vitals filed for this visit.  Subjective Assessment - 10/15/18 1555    Subjective   I am seeing the surgeon Friday - I used the white band since I have seen him last time - but I don't see a big difference in  the stretch since 2 wks ago     Patient Stated Goals  ( hope this work that my finger will me straight again - and that I can use it     Pain Score  3     Pain Location  Finger (Comment which one)   bilateral  4th , R 5th    Pain Orientation  Right;Left    Pain Descriptors / Indicators  Aching    Pain Type  Surgical pain;Chronic pain         [facility] OT Assessment - 10/15/18 0001      Right Hand AROM   R Little PIP 0-100  --   -40 with and without band , -20 PROM      Left Hand AROM   L Ring  MCP 0-90  90 Degrees    L Ring PIP 0-100  --   -25 session               OT Treatments/Exercises (OP) - 10/15/18 0001      LUE Paraffin   Number Minutes Paraffin  8 Minutes    LUE Paraffin Location  Hand    Comments  LMB splint on L 4th PIP extention         Pt arrive  with Digit Widget in place   Pt cont tofollow instructions for rehab Of Digit WIdget  Replace rubber band daily - surgeon switch her on 09/12/2018 to white band - strongest band because of progress was less than 10 degrees and was on mod the 2 wks before that  Edema less this date    5 x day perform un resisted flexion/ extentionexercises- 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, television/film seting finger with it to maintain straight plain- but short periods of functional use too - 15-20 min  But also ed on blocking PIP5x day to do AROM of PIP flexion and extention -reinforce again - loosing flexion - progress in extention also slower  And take off for washing hands and shower and wear close to full time as she can   Ptcont toshow MP joint hyper extention and Need to cont to wearMP flexion strap - to keep MP joint in slight flexion to put turque at PIP - she reports she was pulling it to tight pt show under standing And making progress-   see flowsheet   Done paraffin to L hand and graston tool for brushing nr 2 on volar 4th prior to extention and flexion  DIP and PIP pt to cont with joint jack and ROM Pt to  keep bands off for 15-20 min but more frequent to use digits actively during functional task while able to maintain her extention         OT Education - 10/15/18 1601    Education Details  explain progress slower now , and work actively extention more frequently     Person(s) Educated  Patient    Methods  Explanation;Demonstration    Comprehension  Verbalized understanding;Returned demonstration       OT Short Term Goals - 10/01/18 1625      OT SHORT TERM GOAL #1   Title  Pt to be independent in adjusting rubberbands, doing flexion HEP 4-5x day , keep pain and edema decrease and use Diigit widget  in combination of  MC block strap     Baseline  Needed some education - knew only about changing rubberbands - using white band now - 3rd resistance band     Time  2    Period  Weeks    Status  On-going    Target Date  10/15/18        OT Long Term Goals - 10/01/18 1625      OT LONG TERM GOAL #1   Title  Pt show increase PIP extention to -30 and flexion to 90 using Digit Widget correctly     Baseline  Flexion this date 70 degrees <AROM -45 , with band -40 and PROM -25 - [facility]     Time  2    Period  Weeks    Status  On-going    Target Date  10/15/18            Plan - 10/15/18 1602    Clinical Impression Statement  Pt seen 2 wks ago - pt made 5 degrees improvement in PIP extention of R 5th with Digit Widget - pt having hard time maintain and doing active extention - PROM was -25 , AROM -40 - made great progress since prior to it - pt to see surgeon in 2 days for possibly getting Widget Digit off  - pt to contact me if need to be seen     Occupational performance deficits (Please refer to  evaluation for details):  ADL's;IADL's;Rest and Sleep;Play;Leisure;Social Participation    Rehab Potential  Fair    Clinical Decision Making  Several treatment options, min-mod task modification necessary    OT Treatment/Interventions  Self-care/ADL  training;Therapeutic exercise;Patient/family education;Splinting;Paraffin;Manual Therapy;Passive range of motion;Scar mobilization    Plan  pt to see surgeon - possibly to get Digit widget off - contact me if needed to be seen     OT Home Exercise Plan  see pt instruction    Consulted and Agree with Plan of Care  Patient       Patient will benefit from skilled therapeutic intervention in order to improve the following deficits and impairments:     Visit Diagnosis: Stiffness of right hand, not elsewhere classified  Pain in finger of both hands  Boutonniere deformity of finger, right  Stiffness of left hand, not elsewhere classified  Muscle weakness (generalized)    Problem List Patient Active Problem List   Diagnosis Date Noted  . Encounter for gynecological examination without abnormal finding 04/06/2015  . Mixed incontinence 04/06/2015  . 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 Bursitis of shoulder 12/29/2014  . Tendinitis of right shoulder 12/29/2014  . Other shoulder lesions, right shoulder 12/29/2014  . Episode of syncope 05/11/2014  . Atrial flutter (HCC) 09/05/2012  . CFIDS (chronic fatigue and immune dysfunction syndrome) (HCC) 09/05/2012  . 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 Postural orthostatic tachycardia syndrome 08/30/2011  . Fast heart beat 08/30/2011  . Supraventricular arrhythmia 05/28/2011  . UNSPECIFIED MYOCARDITIS 07/20/2010  . SINUS TACHYCARDIA 07/18/2010  . DYSAUTONOMIA 07/18/2010  . PALPITATIONS, RECURRENT 07/18/2010  . DYSPNEA ON EXERTION 07/18/2010  . 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    Oletta Cohn OTR/L,CLT 10/15/2018, 4:06 PM  [facility] PHYSICAL AND SPORTS MEDICINE [address] [place], [address] Phone: [phone]   Fax:  [phone]  Name: Lisa Clay MRN: [id number] Date of Birth: 04/22/1971

## 2018-10-15 NOTE — Patient Instructions (Signed)
Same

## 2018-10-22 ENCOUNTER — Ambulatory Visit: Payer: Medicaid Other | Admitting: Occupational Therapy

## 2018-10-22 ENCOUNTER — Other Ambulatory Visit: Payer: Self-pay

## 2018-10-22 DIAGNOSIS — M25641 Stiffness of right hand, not elsewhere classified: Secondary | ICD-10-CM | POA: Diagnosis not present

## 2018-10-22 DIAGNOSIS — R600 Localized edema: Secondary | ICD-10-CM

## 2018-10-22 DIAGNOSIS — M79644 Pain in right finger(s): Secondary | ICD-10-CM

## 2018-10-22 DIAGNOSIS — M79645 Pain in left finger(s): Secondary | ICD-10-CM

## 2018-10-22 DIAGNOSIS — M20021 Boutonniere deformity of right finger(s): Secondary | ICD-10-CM

## 2018-10-22 DIAGNOSIS — L905 Scar conditions and fibrosis of skin: Secondary | ICD-10-CM

## 2018-10-22 DIAGNOSIS — M25642 Stiffness of left hand, not elsewhere classified: Secondary | ICD-10-CM

## 2018-10-22 DIAGNOSIS — M6281 Muscle weakness (generalized): Secondary | ICD-10-CM

## 2018-10-22 NOTE — Therapy (Signed)
[facility] [address] [place], [place], [address] Phone: [phone]   Fax:  [phone]  Occupational Therapy Treatment  Patient Details  Name: Lisa SaucierCarly Melissa Clay MRN: [id number] Date of Birth: 1970-10-25 Referring Provider (OT): Mercy Ridingichard Marc   Encounter Date: 10/22/2018  OT End of Session - 10/22/18 1546    Visit Number  6    Number of Visits  12    Date for OT Re-Evaluation  11/26/18    Authorization - Visit Number  5    Authorization - Number of Visits  6    OT Start Time  1408    OT Stop Time  1520    OT Time Calculation (min)  72 min    Activity Tolerance  Patient tolerated treatment well    Behavior During Therapy  [facility] for tasks assessed/performed       Past Medical History:  Diagnosis Date  . Atrial tachycardia (HCC)   . Dysrhythmia    Hx: PSVT. Current: Postural Orthostatic Tachycardia Syndrome, Inappropriate Sinus Node Tachycardia  . Headache    migraines, rare.  . Kidney stones   . Palpitations    [facility]  . Shortness of breath dyspnea    on exertion, secondary to heart issues  . Sinus tachycardia    [facility]  . Syncope    secondary to heart issues    Past Surgical History:  Procedure Laterality Date  . BREAST BIOPSY Left 05/11/2013   stereo biopsy  . CARDIAC ELECTROPHYSIOLOGY STUDY AND ABLATION  04/24/11   [facility]  . CLOSED MANIPULATION SHOULDER WITH STERIOD INJECTION Right 02/16/2015   Procedure: CLOSED MANIPULATION SHOULDER WITH STEROID INJECTION;  Surgeon: Christena FlakeJohn J Poggi, MD;  [facility];  Service: Orthopedics;  Laterality: Right;  . FINGER SURGERY Right 12/25/2017   explorative surgery on R5th PIP   . TONSILLECTOMY      There were no vitals filed for this visit.  Subjective Assessment - 10/22/18 1542    Subjective   They took the pins out last Friday -and then they [facility] these [facility] -but my finger was sore, bleeding  some and she was not able to get  my finger as straight as we got it -and I feel my splints are not giving me a stretch - Dr York SpanielSaid I can take the plint off few times during day and exercise - see him around end of June again     Patient Stated Goals  ( hope this work that my finger will me straight again - and that I can use it     Currently in Pain?  Yes    Pain Score  4     Pain Location  Finger (Comment which one)    Pain Orientation  Right    Pain Descriptors / Indicators  Aching;Tender         [facility] OT Assessment - 10/22/18 0001      Right Hand AROM   R Little PIP 0-100  85 Degrees   -30 PROM ; AROM -55      Pt lost some of her AROM and PROM since last week - pins was removed ( digit widget ) on May 8th  Per pt her finger was sore and bleeding some when [facility]   this date assess her ROM and splints  2 of the splints was able to modify but change padding and straps -  change one of them to elastic strap  Modify to -15 degrees of extention  done heatingpad for 8 min with LMB extention splint on PIP -  Done some stretchs   pt to cont with flexion and extention of PIP  But reinforce extetion more importance than flexion - putty can be use for PIP extention and some gripping   take of splint for about 5 x day about 30 min  fit with buddy strap to use some during 30 min off for function AAROM                 OT Education - 10/22/18 1546    Education Details  splint modifications ,wearing - HEP     Person(s) Educated  Patient    Methods  Explanation;Demonstration    Comprehension  Verbalized understanding;Returned demonstration       OT Short Term Goals - 10/22/18 1553      OT SHORT TERM GOAL #1   Title  Pt to be independent in adjusting rubberbands, doing flexion HEP 4-5x day , keep pain and edema decrease and use Diigit widget  in combination of  MC block strap     Status  Achieved      OT SHORT TERM GOAL #2   Title  Pain on PRWHE improve  with more than 10  points     Baseline  pain on Aug and Sept was 43/50 on PRWHE  and now 27/50 (month ago) - cont to be 22/50 had pins ( digit Widget removed on 8th May )     Time  4    Period  Weeks    Status  On-going    Target Date  11/19/18        OT Long Term Goals - 10/22/18 1554      OT LONG TERM GOAL #1   Title  Pt show increase PIP extention to -30 and flexion to 90 using Digit Widget correctly     Baseline  Did use digit widget corretly - AROM was gained to -45 - PROM -25 -  digit widget removed May 8tht - flexion decreaes to 85     Status  Achieved      OT LONG TERM GOAL #2   Title  Function score on PRWHE improve for pt to use hand more than 10 points     Baseline  at eval PRWHE function score was 17 06/12/48  - still cannot carry more than5 lbs ; hold broom , get gloves on- digit widget just removed May 8th     Time  6    Period  Weeks    Status  On-going    Target Date  12/03/18      OT LONG TERM GOAL #3   Title  AROM for R 5th digit able to maintain -45 degrees of extention to get gloves on     Baseline  -55 this date  AROM - PROM -35 - was last week at -45 -but lost some with splnts made last week at [facility] pt was to painfull and bleeding     Time  6    Period  Weeks    Status  New    Target Date  12/03/18      OT LONG TERM GOAL #4   Title   R 5th PIP improve to 90 degrees of flexion to bake cookies and hold broom but while maintaining her extention     Baseline  Flexion at  PIP 85  - but did not wear splint for last hour -and extnetion was -55 - last week -45     Time  6    Period  Weeks    Status  New    Target Date  12/03/18            Plan - 10/22/18 1547    Clinical Impression Statement  Pt had last week her Digit Widget removed - pins was taken out - and 5th digit painfull still - she arrive with 3 PIP extention gutter splints -but when fabricated at [facility] - finger was sore and bleeding per pt and felt like her splints was not able to be made with  enough extention - this date was able to fabricate her splints at -15 degrees - she arrive this date with AROM -55 at PIP andPROM -35 and after heat -25  - PIP flexion this date 49 - pt can remove splnt during day few times and work on AROM - pt can benefit from cont OT services     Occupational performance deficits (Please refer to evaluation for details):  ADL's;IADL's;Rest and Sleep;Play;Leisure;Social Participation    Rehab Potential  Fair    Clinical Decision Making  Several treatment options, min-mod task modification necessary    OT Frequency  1x / week    OT Duration  6 weeks    OT Treatment/Interventions  Self-care/ADL training;Therapeutic exercise;Patient/family education;Splinting;Paraffin;Manual Therapy;Passive range of motion;Scar mobilization    Plan  assess progress in AROM for PIP and able to maintain her extention - spling adjustments as needed    OT Home Exercise Plan  see pt instruction    Consulted and Agree with Plan of Care  Patient       Patient will benefit from skilled therapeutic intervention in order to improve the following deficits and impairments:     Visit Diagnosis: Stiffness of right hand, not elsewhere classified - Plan: Ot plan of care cert/re-cert  Pain in finger of both hands - Plan: Ot plan of care cert/re-cert  Boutonniere deformity of finger, right - Plan: Ot plan of care cert/re-cert  Stiffness of left hand, not elsewhere classified - Plan: Ot plan of care cert/re-cert  Muscle weakness (generalized) - Plan: Ot plan of care cert/re-cert  Scar condition and fibrosis of skin - Plan: Ot plan of care cert/re-cert  Localized edema - Plan: Ot plan of care cert/re-cert    Problem List Patient Active Problem List   Diagnosis Date Noted  . Encounter for gynecological examination without abnormal finding 04/06/2015  . Mixed incontinence 04/06/2015  . 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 Bursitis of shoulder 12/29/2014  . Tendinitis of right shoulder  12/29/2014  . Other shoulder lesions, right shoulder 12/29/2014  . Episode of syncope 05/11/2014  . Atrial flutter (HCC) 09/05/2012  . CFIDS (chronic fatigue and immune dysfunction syndrome) (HCC) 09/05/2012  . 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 Postural orthostatic tachycardia syndrome 08/30/2011  . Fast heart beat 08/30/2011  . Supraventricular arrhythmia 05/28/2011  . UNSPECIFIED MYOCARDITIS 07/20/2010  . SINUS TACHYCARDIA 07/18/2010  . DYSAUTONOMIA 07/18/2010  . PALPITATIONS, RECURRENT 07/18/2010  . DYSPNEA ON EXERTION 07/18/2010  . 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    Oletta Cohn OTR/L,CLT 10/22/2018, 4:02 PM  [facility] PHYSICAL AND SPORTS MEDICINE [address] [place], [address] Phone: [phone]   Fax:  [phone]  Name: Lisa Clay MRN: [id number] Date of Birth: 02/27/1971

## 2018-10-22 NOTE — Patient Instructions (Signed)
Splint wearing all the time - off for ADL's  And 5 x day for about 30 min to hour  And do HEP - with putty , gripping , digits extention - and rolling to for extention of digit Pinching too

## 2018-10-29 ENCOUNTER — Other Ambulatory Visit: Payer: Self-pay

## 2018-10-29 ENCOUNTER — Ambulatory Visit: Payer: Medicaid Other | Admitting: Occupational Therapy

## 2018-10-29 DIAGNOSIS — M79644 Pain in right finger(s): Secondary | ICD-10-CM

## 2018-10-29 DIAGNOSIS — M79645 Pain in left finger(s): Secondary | ICD-10-CM

## 2018-10-29 DIAGNOSIS — M25642 Stiffness of left hand, not elsewhere classified: Secondary | ICD-10-CM

## 2018-10-29 DIAGNOSIS — M25641 Stiffness of right hand, not elsewhere classified: Secondary | ICD-10-CM

## 2018-10-29 DIAGNOSIS — R600 Localized edema: Secondary | ICD-10-CM

## 2018-10-29 DIAGNOSIS — M6281 Muscle weakness (generalized): Secondary | ICD-10-CM

## 2018-10-29 DIAGNOSIS — L905 Scar conditions and fibrosis of skin: Secondary | ICD-10-CM

## 2018-10-29 DIAGNOSIS — M20021 Boutonniere deformity of right finger(s): Secondary | ICD-10-CM

## 2018-10-29 NOTE — Patient Instructions (Signed)
Same but take splints during day off 1 1/2 off and on 2 hrs alternating  Using new splint that is more straight and joint jack - to take pressure off top of PIP  And cont with night time splint   Cont with AROM and putty for PIP extention

## 2018-10-29 NOTE — Therapy (Signed)
[facility] [address] [place], [address] Phone: [phone]   Fax:  [phone]  Occupational Therapy Treatment  Patient Details  Name: Lisa Clay MRN: [id number] Date of Birth: May 15, 1971 Referring Provider (OT): Mercy Riding   Encounter Date: 10/29/2018  OT End of Session - 10/29/18 1531    Visit Number  7    Number of Visits  12    Date for OT Re-Evaluation  11/26/18    OT Start Time  1407    OT Stop Time  1506    OT Time Calculation (min)  59 min    Activity Tolerance  Patient tolerated treatment well    Behavior During Therapy  [facility] for tasks assessed/performed       Past Medical History:  Diagnosis Date  . Atrial tachycardia (HCC)   . Dysrhythmia    Hx: PSVT. Current: Postural Orthostatic Tachycardia Syndrome, Inappropriate Sinus Node Tachycardia  . Headache    migraines, rare.  . Kidney stones   . Palpitations    [facility] 2/12  . Shortness of breath dyspnea    on exertion, secondary to heart issues  . Sinus tachycardia    [facility] 2/12  . Syncope    secondary to heart issues    Past Surgical History:  Procedure Laterality Date  . BREAST BIOPSY Left 05/11/2013   stereo biopsy  . CARDIAC ELECTROPHYSIOLOGY STUDY AND ABLATION  04/24/11   [facility]  . CLOSED MANIPULATION SHOULDER WITH STERIOD INJECTION Right 02/16/2015   Procedure: CLOSED MANIPULATION SHOULDER WITH STEROID INJECTION;  Surgeon: Christena Flake, MD;  Location: [facility];  Service: Orthopedics;  Laterality: Right;  . FINGER SURGERY Right 12/25/2017   explorative surgery on R5th PIP   . TONSILLECTOMY      There were no vitals filed for this visit.  Subjective Assessment - 10/29/18 1528    Subjective   Doing okay - did go between the 2 splints we modified last time - but my top of finger is tender - like as if it wants to get bliister - even with the padding of the foam under strap     Patient  Stated Goals  ( hope this work that my finger will me straight again - and that I can use it     Currently in Pain?  Yes    Pain Score  4     Pain Location  Finger (Comment which one)    Pain Orientation  Right    Pain Descriptors / Indicators  Aching;Tender    Pain Type  Surgical pain;Chronic pain    Pain Onset  More than a month ago         [facility] OT Assessment - 10/29/18 0001      Right Hand AROM   R Little PIP 0-100  85 Degrees   -45 coming in , in session AROM -40 and PROM -30      progress from last time - see flowsheet in 5th PIP extention          OT Treatments/Exercises (OP) - 10/29/18 0001      Moist Heat Therapy   Number Minutes Moist Heat  8 Minutes    Moist Heat Location  --   prior to soft tissue       Pt  Gained some of her extention back again - since she lost some of her AROM and PROM since 2  Weeks ago - pins  was removed ( digit widget ) on May 8th  Per pt her finger was sore and bleeding some when splints was made at [facility]   this date assess her ROM and splints  2 of the splints was able to modify but change padding and straps - change one of them to elastic strap  Fabricated new splint on L 5th to get it straight - pt middle phalanges  has been deviating at PIP - pt to wear the straight one made today only during day couple of times to decrease pressure on radial side of tip -  Modify to -15 degrees of extention last time her splints- and new on at 0   done heatingpad for 8 min again this date  Done some stretchs and graston tool nr 2 brushing on volar 5th prior to ROM    pt to cont with flexion and extention of PIP AROM on table and into putty  But reinforce extetion more importance than flexion - putty can be use for PIP extention and some gripping   take of splint for about 5 x day about  1 1/2 now and 2 hrs on alternating         Same but take splints during day off 1 1/2 off and on 2 hrs alternating  Using new splint that is more straight  and joint jack - to take pressure off top of PIP  And cont with night time splint   Cont with AROM and putty for PIP extention      OT Education - 10/29/18 1531    Education Details  splint modifications ,wearing - HEP     Person(s) Educated  Patient    Methods  Demonstration;Handout;Verbal cues;Explanation    Comprehension  Verbalized understanding;Returned demonstration       OT Short Term Goals - 10/22/18 1553      OT SHORT TERM GOAL #1   Title  Pt to be independent in adjusting rubberbands, doing flexion HEP 4-5x day , keep pain and edema decrease and use Diigit widget  in combination of  MC block strap     Status  Achieved      OT SHORT TERM GOAL #2   Title  Pain on PRWHE improve with more than 10  points     Baseline  pain on Aug and Sept was 43/50 on PRWHE  and now 27/50 (month ago) - cont to be 22/50 had pins ( digit Widget removed on 8th May )     Time  4    Period  Weeks    Status  On-going    Target Date  11/19/18        OT Long Term Goals - 10/22/18 1554      OT LONG TERM GOAL #1   Title  Pt show increase PIP extention to -30 and flexion to 90 using Digit Widget correctly     Baseline  Did use digit widget corretly - AROM was gained to -45 - PROM -25 -  digit widget removed May 8tht - flexion decreaes to 85     Status  Achieved      OT LONG TERM GOAL #2   Title  Function score on PRWHE improve for pt to use hand more than 10 points     Baseline  at eval PRWHE function score was 17 06/12/48  - still cannot carry more than5 lbs ; hold broom , get gloves on- digit widget just removed May 8th  Time  6    Period  Weeks    Status  On-going    Target Date  12/03/18      OT LONG TERM GOAL #3   Title  AROM for R 5th digit able to maintain -45 degrees of extention to get gloves on     Baseline  -55 this date  AROM - PROM -35 - was last week at -45 -but lost some [facility] was to painfull and bleeding     Time  6    Period  Weeks     Status  New    Target Date  12/03/18      OT LONG TERM GOAL #4   Title   R 5th PIP improve to 90 degrees of flexion to bake cookies and hold broom but while maintaining her extention     Baseline  Flexion at PIP 85  - but did not wear splint for last hour -and extnetion was -55 - last week -45     Time  6    Period  Weeks    Status  New    Target Date  12/03/18            Plan - 10/29/18 1532    Clinical Impression Statement  Pt showed again this past week increase extention at PIP compare to last week when she lost some of her progress after digit Widget was removed and splints was fabricated that same day - modified them last time to increase extention -  pt do appear to have tender place on top of PIP - she had in past blister there - pt to keep strap with most pressure behind PIP -and other over foam or moldskin padding do not have to be so tight - and to work on keeping splint off for 1 1/2 several times during day with 2 hrs on - alternating while using it and doing HEP     Occupational performance deficits (Please refer to evaluation for details):  ADL's;IADL's;Rest and Sleep;Play;Leisure;Social Participation    Rehab Potential  Fair    Clinical Decision Making  Several treatment options, min-mod task modification necessary    OT Frequency  1x / week    OT Duration  6 weeks    OT Treatment/Interventions  Self-care/ADL training;Therapeutic exercise;Patient/family education;Splinting;Paraffin;Manual Therapy;Passive range of motion;Scar mobilization    Plan  assess progress in AROM for PIP and able to maintain her extention - spling adjustments as needed    OT Home Exercise Plan  see pt instruction    Consulted and Agree with Plan of Care  Patient       Patient will benefit from skilled therapeutic intervention in order to improve the following deficits and impairments:     Visit Diagnosis: Stiffness of right hand, not elsewhere classified  Pain in finger of both  hands  Boutonniere deformity of finger, right  Stiffness of left hand, not elsewhere classified  Muscle weakness (generalized)  Scar condition and fibrosis of skin  Localized edema    Problem List Patient Active Problem List   Diagnosis Date Noted  . Encounter for gynecological examination without abnormal finding 04/06/2015  . Mixed incontinence 04/06/2015  . 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 Bursitis of shoulder 12/29/2014  . Tendinitis of right shoulder 12/29/2014  . Other shoulder lesions, right shoulder 12/29/2014  . Episode of syncope 05/11/2014  . Atrial flutter (HCC) 09/05/2012  . CFIDS (chronic fatigue and immune dysfunction  syndrome) (HCC) 09/05/2012  . 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 Postural orthostatic tachycardia syndrome 08/30/2011  . Fast heart beat 08/30/2011  . Supraventricular arrhythmia 05/28/2011  . UNSPECIFIED MYOCARDITIS 07/20/2010  . SINUS TACHYCARDIA 07/18/2010  . DYSAUTONOMIA 07/18/2010  . PALPITATIONS, RECURRENT 07/18/2010  . DYSPNEA ON EXERTION 07/18/2010  . 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    Oletta Cohn  OTR/L,CLT 10/29/2018, 3:35 PM  [facility] [address], [place], [address] Phone: [phone]   Fax:  [phone]  Name: Lisa Clay MRN: [id number] Date of Birth: 07/30/1970

## 2018-11-05 ENCOUNTER — Ambulatory Visit: Payer: Medicaid Other | Admitting: Occupational Therapy

## 2018-11-05 ENCOUNTER — Other Ambulatory Visit: Payer: Self-pay

## 2018-11-05 DIAGNOSIS — M79645 Pain in left finger(s): Secondary | ICD-10-CM

## 2018-11-05 DIAGNOSIS — M6281 Muscle weakness (generalized): Secondary | ICD-10-CM

## 2018-11-05 DIAGNOSIS — M79644 Pain in right finger(s): Secondary | ICD-10-CM

## 2018-11-05 DIAGNOSIS — M25641 Stiffness of right hand, not elsewhere classified: Secondary | ICD-10-CM

## 2018-11-05 DIAGNOSIS — M25642 Stiffness of left hand, not elsewhere classified: Secondary | ICD-10-CM

## 2018-11-05 DIAGNOSIS — R600 Localized edema: Secondary | ICD-10-CM

## 2018-11-05 DIAGNOSIS — M20021 Boutonniere deformity of right finger(s): Secondary | ICD-10-CM

## 2018-11-05 DIAGNOSIS — L905 Scar conditions and fibrosis of skin: Secondary | ICD-10-CM

## 2018-11-05 NOTE — Patient Instructions (Signed)
Cont to keep splint off about hour or 2 - 5 x day  And to golf ball , 3 cm cube , to beads of 1-2 cm - palm to tip of 4th an 5th digits for flexion<> extention  After wearing splint /jont jack - putty light blue for PIP's extention

## 2018-11-05 NOTE — Therapy (Signed)
[facility] [address] [place], [place], [id number] Phone: [phone]   Fax:  [phone]  Occupational Therapy Treatment  Patient Details  Name: Lisa Clay MRN: [id number] Date of Birth: 04-12-1971 Referring Provider (OT): Mercy Ridingichard Marc   Encounter Date: 11/05/2018  OT End of Session - 11/05/18 1505    Visit Number  8    Number of Visits  12    Date for OT Re-Evaluation  11/26/18    Authorization - Visit Number  1    Authorization - Number of Visits  6    OT Start Time  1505    OT Stop Time  1558    OT Time Calculation (min)  53 min    Activity Tolerance  Patient tolerated treatment well    Behavior During Therapy  [facility] for tasks assessed/performed       Past Medical History:  Diagnosis Date  . Atrial tachycardia (HCC)   . Dysrhythmia    Hx: PSVT. Current: Postural Orthostatic Tachycardia Syndrome, Inappropriate Sinus Node Tachycardia  . Headache    migraines, rare.  . Kidney stones   . Palpitations    [facility] 2/12  . Shortness of breath dyspnea    on exertion, secondary to heart issues  . Sinus tachycardia    [facility]  . Syncope    secondary to heart issues    Past Surgical History:  Procedure Laterality Date  . BREAST BIOPSY Left 05/11/2013   stereo biopsy  . CARDIAC ELECTROPHYSIOLOGY STUDY AND ABLATION  04/24/11   [facility]  . CLOSED MANIPULATION SHOULDER WITH STERIOD INJECTION Right 02/16/2015   Procedure: CLOSED MANIPULATION SHOULDER WITH STEROID INJECTION;  Surgeon: Christena FlakeJohn J Poggi, MD;  [facility];  Service: Orthopedics;  Laterality: Right;  . FINGER SURGERY Right 12/25/2017   explorative surgery on R5th PIP   . TONSILLECTOMY      There were no vitals filed for this visit.  Subjective Assessment - 11/05/18 1504    Subjective   I did keep my splint off some times about hour and 1/2 to 2 hrs - I have a lot of cookies to bake - finger doing okay     Patient Stated Goals  ( hope this work that my finger will me straight again - and that I can use it     Currently in Pain?  --   no pain scale provided this date        -42 for PIP extention coming in - PROM -25   Done with pt functional act - golf ball palm <> 4th and 5th digits tip  Then to 3 cm object - then 2 cm , then 1 cm objects  14 reps each - using flexion to extention of 4th and  5th with opposition   Also using 2 cm object pick up and hold with 4th and 5th - and retrieve 2nd object with 2nd and 3rd -and then out of hand- but after 30 min of functional flexion ,extention  - 5th PIP extention decrease to -60  Also using with above act - work on aligning 4th and 5th during ROM - 5th still deviating out of PIP distally   Joint jack use after heat - 5 min -and done light blue putty PIP extention 10 reps  to do same at home              OT Treatments/Exercises (OP) - 11/05/18 0001  Moist Heat Therapy   Number Minutes Moist Heat  8 Minutes    Moist Heat Location  --   LMB splint on after ROM HEP prior to putty     Done some graston and soft tissue joint mobs - light traction on PIP    pt to cont with flexion and extention of PIP AROM on table and into putty  But reinforce extetion more importance than flexion - putty can be use for PIP extention and some gripping but after using joint jack - ed on using it to get more of pull prior to putty PIP extention  take of splint for about 5 x day about  1 1/2 now and 2 hrs to work on ROM     Cont to keep splint off about hour or 2 - 5 x day  And to golf ball , 3 cm cube , to beads of 1-2 cm - palm to tip of 4th an 5th digits for flexion<> extention  After wearing splint /jont jack - putty light blue for PIP's extention       OT Education - 11/05/18 1505    Education Details  HEP changes     Person(s) Educated  Patient    Methods  Demonstration;Handout;Verbal cues;Explanation    Comprehension  Verbalized  understanding;Returned demonstration       OT Short Term Goals - 10/22/18 1553      OT SHORT TERM GOAL #1   Title  Pt to be independent in adjusting rubberbands, doing flexion HEP 4-5x day , keep pain and edema decrease and use Diigit widget  in combination of  MC block strap     Status  Achieved      OT SHORT TERM GOAL #2   Title  Pain on PRWHE improve with more than 10  points     Baseline  pain on Aug and Sept was 43/50 on PRWHE  and now 27/50 (month ago) - cont to be 22/50 had pins ( digit Widget removed on 8th May )     Time  4    Period  Weeks    Status  On-going    Target Date  11/19/18        OT Long Term Goals - 10/22/18 1554      OT LONG TERM GOAL #1   Title  Pt show increase PIP extention to -30 and flexion to 90 using Digit Widget correctly     Baseline  Did use digit widget corretly - AROM was gained to -45 - PROM -25 -  digit widget removed May 8tht - flexion decreaes to 85     Status  Achieved      OT LONG TERM GOAL #2   Title  Function score on PRWHE improve for pt to use hand more than 10 points     Baseline  at eval PRWHE function score was 17 06/12/48  - still cannot carry more than5 lbs ; hold broom , get gloves on- digit widget just removed May 8th     Time  6    Period  Weeks    Status  On-going    Target Date  12/03/18      OT LONG TERM GOAL #3   Title  AROM for R 5th digit able to maintain -45 degrees of extention to get gloves on     Baseline  -55 this date  AROM - PROM -35 - was last week at -45 -but lost some  with splnts made last week at [facility] pt was to painfull and bleeding     Time  6    Period  Weeks    Status  New    Target Date  12/03/18      OT LONG TERM GOAL #4   Title   R 5th PIP improve to 90 degrees of flexion to bake cookies and hold broom but while maintaining her extention     Baseline  Flexion at PIP 85  - but did not wear splint for last hour -and extnetion was -55 - last week -45     Time  6    Period  Weeks    Status  New     Target Date  12/03/18            Plan - 11/05/18 1505    Clinical Impression Statement  Pt show this date PIP extention -42 coming in - AROM in clinic working on flexion , extention using 4thand 5th manipution of objects palm to tip of 4thand 5th - extention decrease to -50 to -60 - but PROM this date -25 - pt to work on functional AROM flexion , extnetion - and putty for PIP extention  to be able to maintain exttention     Occupational performance deficits (Please refer to evaluation for details):  ADL's;IADL's;Rest and Sleep;Play;Leisure;Social Participation    Rehab Potential  Fair    Clinical Decision Making  Several treatment options, min-mod task modification necessary    OT Frequency  1x / week    OT Duration  6 weeks    OT Treatment/Interventions  Self-care/ADL training;Therapeutic exercise;Patient/family education;Splinting;Paraffin;Manual Therapy;Passive range of motion;Scar mobilization    Plan  assess progress in AROM for PIP and able to maintain her extention - spling adjustments as needed    OT Home Exercise Plan  see pt instruction    Consulted and Agree with Plan of Care  Patient       Patient will benefit from skilled therapeutic intervention in order to improve the following deficits and impairments:     Visit Diagnosis: Stiffness of right hand, not elsewhere classified  Pain in finger of both hands  Boutonniere deformity of finger, right  Stiffness of left hand, not elsewhere classified  Muscle weakness (generalized)  Scar condition and fibrosis of skin  Localized edema    Problem List Patient Active Problem List   Diagnosis Date Noted  . Encounter for gynecological examination without abnormal finding 04/06/2015  . Mixed incontinence 04/06/2015  . 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 Bursitis of shoulder 12/29/2014  . Tendinitis of right shoulder 12/29/2014  . Other shoulder lesions, right shoulder 12/29/2014  . Episode of syncope 05/11/2014  .  Atrial flutter (HCC) 09/05/2012  . CFIDS (chronic fatigue and immune dysfunction syndrome) (HCC) 09/05/2012  . 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 Postural orthostatic tachycardia syndrome 08/30/2011  . Fast heart beat 08/30/2011  . Supraventricular arrhythmia 05/28/2011  . UNSPECIFIED MYOCARDITIS 07/20/2010  . SINUS TACHYCARDIA 07/18/2010  . DYSAUTONOMIA 07/18/2010  . PALPITATIONS, RECURRENT 07/18/2010  . DYSPNEA ON EXERTION 07/18/2010  . 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    Oletta Cohn OTR/L,CLT 11/05/2018, 5:47 PM   [facility] PHYS[facility] AND SPORTS MEDICINE [address], [place], [address] Phone: [phone]   Fax:  [phone]  Name: Lisa Clay MRN: [id number] Date of Birth: 11-19-1970

## 2018-11-12 ENCOUNTER — Ambulatory Visit: Payer: Medicaid Other | Admitting: Occupational Therapy

## 2018-11-13 ENCOUNTER — Ambulatory Visit: Payer: Medicaid Other | Attending: Orthopedic Surgery | Admitting: Occupational Therapy

## 2019-08-19 DIAGNOSIS — E785 Hyperlipidemia, unspecified: Secondary | ICD-10-CM | POA: Insufficient documentation

## 2019-08-20 ENCOUNTER — Other Ambulatory Visit: Payer: Self-pay

## 2019-08-20 DIAGNOSIS — Z1231 Encounter for screening mammogram for malignant neoplasm of breast: Secondary | ICD-10-CM

## 2019-10-19 ENCOUNTER — Other Ambulatory Visit: Payer: Self-pay

## 2019-10-19 ENCOUNTER — Encounter: Payer: Self-pay | Admitting: Occupational Therapy

## 2019-10-19 ENCOUNTER — Ambulatory Visit: Payer: Medicaid Other | Attending: Rheumatology | Admitting: Occupational Therapy

## 2019-10-19 DIAGNOSIS — M6281 Muscle weakness (generalized): Secondary | ICD-10-CM | POA: Diagnosis present

## 2019-10-19 DIAGNOSIS — M79641 Pain in right hand: Secondary | ICD-10-CM | POA: Insufficient documentation

## 2019-10-19 DIAGNOSIS — M25641 Stiffness of right hand, not elsewhere classified: Secondary | ICD-10-CM | POA: Diagnosis present

## 2019-10-19 DIAGNOSIS — M79642 Pain in left hand: Secondary | ICD-10-CM | POA: Insufficient documentation

## 2019-10-19 DIAGNOSIS — M25642 Stiffness of left hand, not elsewhere classified: Secondary | ICD-10-CM | POA: Diagnosis present

## 2019-10-19 DIAGNOSIS — R600 Localized edema: Secondary | ICD-10-CM | POA: Diagnosis present

## 2019-10-19 NOTE — Patient Instructions (Signed)
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

## 2019-10-19 NOTE — Therapy (Signed)
[place] [facility] [address], [place], [address] Phone: [phone]   Fax:  [phone]  Occupational Therapy Evaluation  Patient Details  Name: Lisa Clay MRN: [id number] Date of Birth: 1970-08-08 No data recorded  Encounter Date: 10/19/2019  OT End of Session - 10/19/19 1053    Visit Number  1    Number of Visits  4    Date for OT Re-Evaluation  11/30/19    Authorization - Visit Number  0    Authorization - Number of Visits  3    OT Start Time  0930    OT Stop Time  1031    OT Time Calculation (min)  61 min    Activity Tolerance  Patient tolerated treatment well    Behavior During Therapy  [facility] for tasks assessed/performed       Past Medical History:  Diagnosis Date  . Atrial tachycardia ([place])   . Dysrhythmia    Hx: PSVT. Current: Postural Orthostatic Tachycardia Syndrome, Inappropriate Sinus Node Tachycardia  . Headache    migraines, rare.  . Kidney stones   . Palpitations    [facility] 2/12  . Shortness of breath dyspnea    on exertion, secondary to heart issues  . Sinus tachycardia    [facility] 2/12  . Syncope    secondary to heart issues    Past Surgical History:  Procedure Laterality Date  . BREAST BIOPSY Left 05/11/2013   stereo biopsy  . CARDIAC ELECTROPHYSIOLOGY STUDY AND ABLATION  04/24/11   [facility]  . CLOSED MANIPULATION SHOULDER WITH STERIOD INJECTION Right 02/16/2015   Procedure: CLOSED MANIPULATION SHOULDER WITH STEROID INJECTION;  Surgeon: Corky Mull, MD;  Location: [facility];  Service: Orthopedics;  Laterality: Right;  . FINGER SURGERY Right 12/25/2017   explorative surgery on R5th PIP   . TONSILLECTOMY      There were no vitals filed for this visit.  Subjective Assessment - 10/19/19 1046    Subjective   My hands are just hurting more. My L 4th and 5th fingers , and same on the R - at rest 3-5/10 and more stiffness and tightness - I have been baking  a lot the last month and had some thoracic pain too since Easter    Pertinent History  Pt has long standing history of inflammatory arthritis - had extended R 5th digit surgery in 2020 , and cont to have increase stiffness, tightness with pain in L hand more than R hand - with some increase thoracic pain - she do bake a lot , work some in the garden , and do her own house work    Patient Stated Goals  I would like the pain to be better    Currently in Pain?  Yes    Pain Score  5     Pain Location  Hand    Pain Orientation  Left;Right    Pain Descriptors / Indicators  Aching;Tender;Tightness    Pain Type  Chronic pain    Pain Onset  1 to 4 weeks ago    Pain Frequency  Constant        [facility] OT Assessment - 10/19/19 0001      Strength   Right Hand Grip (lbs)  46    Right Hand Lateral Pinch  13 lbs    Right Hand 3 Point Pinch  14 lbs    Left Hand Grip (lbs)  43    Left  Hand Lateral Pinch  12 lbs    Left Hand 3 Point Pinch  15 lbs      Right Hand AROM   R Index  MCP 0-90  90 Degrees    R Index PIP 0-100  100 Degrees    R Long  MCP 0-90  90 Degrees    R Long PIP 0-100  100 Degrees    R Ring  MCP 0-90  90 Degrees    R Ring PIP 0-100  90 Degrees   -10   R Little  MCP 0-90  90 Degrees    R Little PIP 0-100  90 Degrees   -90     Left Hand AROM   L Index  MCP 0-90  90 Degrees    L Index PIP 0-100  100 Degrees    L Long  MCP 0-90  90 Degrees    L Long PIP 0-100  100 Degrees    L Ring  MCP 0-90  90 Degrees    L Ring PIP 0-100  95 Degrees   -30   L Little  MCP 0-90  90 Degrees    L Little PIP 0-100  100 Degrees               OT Treatments/Exercises (OP) - 10/19/19 0001      RUE Paraffin   Number Minutes Paraffin  8 Minutes    RUE Paraffin Location  Hand    Comments  prior to review of HEP       LUE Paraffin   Number Minutes Paraffin  8 Minutes    LUE Paraffin Location  Hand    Comments  prior to review of HEP        Moist heat in the am and pm   Tendon glides  pain free 10 reps  Joint protection principles review with pt - respect for pain - keep under 2-3/10 - pain linger more than 2 hrs or increase pain 12-24 hrs later  modify - but using larger joints and avoid tight and prolonged grips   Use AE and look for tools to make her baking easier on her hands Discuss some gentle Yoga for flexibility , back pain  Ergonomic sitting doing her cookies - decoration -and standing up every 15 min         OT Education - 10/19/19 1053    Education Details  findings and HEP    Person(s) Educated  Patient    Methods  Demonstration;Handout;Verbal cues;Explanation    Comprehension  Verbalized understanding;Returned demonstration        OT Long Term Goals - 10/19/19 1059      OT LONG TERM GOAL #1   Title  Pt to verbalize 3 joint protection prinicples implemented to decrease pain at rest to less than 3/10 in bilateral hands    Baseline  pain at rest 3-5/10 in L hand more than R hand - no knowledge on AE and joint protection - bake about 8 hrs day 5 x wk    Time  4    Period  Weeks    Status  New    Target Date  11/16/19      OT LONG TERM GOAL #2   Title  Pt report implementing Homeprogram of AROM , moist heat, joint protection and AE to decrease pain , stiffness and tightness in bilateral hands    Baseline  no knowledge on AE or joint protection - and pain 3-5/10 rest- stiffness  and tightness in bilateral hands    Time  6    Period  Weeks    Status  New    Target Date  11/30/19            Plan - 10/19/19 1054    Clinical Impression Statement  Pt refer to OT for increase bilateral hand pain, stiffness , tightness with diagnosis of inflammatory arthritis - pt was seen about year ago after she had extended surgery to R 5th digit. Upon assessment digits AROM was [facility] - except R 5th PIP has 90 degrees flexor contracture, 4th -10 And L hand 4th -30 at PIP - pain 3-5/10 in bilatearl 4th and 5th digits - pt ed on joint protection and AE  recommendations - also had increase thoracic pain the last month- pt can benefit from OT services to decrease pain to be independent in ADL;s and IADL's    OT Occupational Profile and History  Problem Focused Assessment - Including review of records relating to presenting problem    Occupational performance deficits (Please refer to evaluation for details):  ADL's;IADL's;Rest and Sleep;Play;Leisure;Social Participation    Body Structure / Function / Physical Skills  ADL;IADL;Pain;UE functional use;Flexibility;ROM    Rehab Potential  Fair    Clinical Decision Making  Limited treatment options, no task modification necessary    Comorbidities Affecting Occupational Performance:  May have comorbidities impacting occupational performance   inflammatory arthritis   Modification or Assistance to Complete Evaluation   No modification of tasks or assist necessary to complete eval    OT Frequency  Biweekly    OT Duration  6 weeks    OT Treatment/Interventions  Self-care/ADL training;Therapeutic exercise;Patient/family education;Paraffin;Manual Therapy;Passive range of motion;DME and/or AE instruction;Moist Heat    Plan  assess implementation of joint protection and AE    OT Home Exercise Plan  see pt instruction    Consulted and Agree with Plan of Care  Patient       Patient will benefit from skilled therapeutic intervention in order to improve the following deficits and impairments:   Body Structure / Function / Physical Skills: ADL, IADL, Pain, UE functional use, Flexibility, ROM       Visit Diagnosis: Pain in left hand - Plan: Ot plan of care cert/re-cert  Pain in right hand - Plan: Ot plan of care cert/re-cert  Stiffness of left hand, not elsewhere classified - Plan: Ot plan of care cert/re-cert  Stiffness of right hand, not elsewhere classified - Plan: Ot plan of care cert/re-cert    Problem List Patient Active Problem List   Diagnosis Date Noted  . Encounter for gynecological  examination without abnormal finding 04/06/2015  . Mixed incontinence 04/06/2015  . 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 Bursitis of shoulder 12/29/2014  . Tendinitis of right shoulder 12/29/2014  . Other shoulder lesions, right shoulder 12/29/2014  . Episode of syncope 05/11/2014  . Atrial flutter ([facility]) 09/05/2012  . CFIDS (chronic fatigue and immune dysfunction syndrome) ([facility]) 09/05/2012  . 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 Postural orthostatic tachycardia syndrome 08/30/2011  . Fast heart beat 08/30/2011  . Supraventricular arrhythmia 05/28/2011  . UNSPECIFIED MYOCARDITIS 07/20/2010  . SINUS TACHYCARDIA 07/18/2010  . DYSAUTONOMIA 07/18/2010  . PALPITATIONS, RECURRENT 07/18/2010  . DYSPNEA ON EXERTION 07/18/2010  . 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    Oletta Cohn OTR/l,CLT 10/19/2019, 11:05 AM  [facility] PHYSICAL AND SPORTS MEDICINE [address] [place], [address] Phone: [phone]   Fax:  [phone]  Name: Lisa Clay MRN: [id number] Date of Birth: 30-Oct-1970

## 2019-11-02 ENCOUNTER — Other Ambulatory Visit: Payer: Self-pay

## 2019-11-02 ENCOUNTER — Ambulatory Visit: Payer: Medicaid Other | Admitting: Occupational Therapy

## 2019-11-02 ENCOUNTER — Encounter: Payer: Self-pay | Admitting: Occupational Therapy

## 2019-11-02 DIAGNOSIS — R600 Localized edema: Secondary | ICD-10-CM

## 2019-11-02 DIAGNOSIS — M79642 Pain in left hand: Secondary | ICD-10-CM

## 2019-11-02 DIAGNOSIS — M79641 Pain in right hand: Secondary | ICD-10-CM

## 2019-11-02 DIAGNOSIS — M6281 Muscle weakness (generalized): Secondary | ICD-10-CM

## 2019-11-02 DIAGNOSIS — M25641 Stiffness of right hand, not elsewhere classified: Secondary | ICD-10-CM

## 2019-11-02 DIAGNOSIS — M25642 Stiffness of left hand, not elsewhere classified: Secondary | ICD-10-CM

## 2019-11-05 NOTE — Therapy (Signed)
[facility] MEDICINE [address] [place], [address] Phone: [phone]   Fax:  [phone]  Occupational Therapy Treatment  Patient Details  Name: Lisa Clay MRN: [id number] Date of Birth: July 07, 1970 No data recorded  Encounter Date: 11/02/2019  OT End of Session - 11/05/19 1509    Visit Number  2    Number of Visits  4    Date for OT Re-Evaluation  11/30/19    Authorization - Visit Number  1    Authorization - Number of Visits  3    OT Start Time  0930    OT Stop Time  1025    OT Time Calculation (min)  55 min    Activity Tolerance  Patient tolerated treatment well    Behavior During Therapy  [facility] for tasks assessed/performed       Past Medical History:  Diagnosis Date  . Atrial tachycardia ([facility])   . Dysrhythmia    Hx: PSVT. Current: Postural Orthostatic Tachycardia Syndrome, Inappropriate Sinus Node Tachycardia  . Headache    migraines, rare.  . Kidney stones   . Palpitations    [facility] 2/12  . Shortness of breath dyspnea    on exertion, secondary to heart issues  . Sinus tachycardia    [facility] 2/12  . Syncope    secondary to heart issues    Past Surgical History:  Procedure Laterality Date  . BREAST BIOPSY Left 05/11/2013   stereo biopsy  . CARDIAC ELECTROPHYSIOLOGY STUDY AND ABLATION  04/24/11   [facility]  . CLOSED MANIPULATION SHOULDER WITH STERIOD INJECTION Right 02/16/2015   Procedure: CLOSED MANIPULATION SHOULDER WITH STEROID INJECTION;  Surgeon: Christena Flake, MD;  Location: [facility];  Service: Orthopedics;  Laterality: Right;  . FINGER SURGERY Right 12/25/2017   explorative surgery on R5th PIP   . TONSILLECTOMY      There were no vitals filed for this visit.  Subjective Assessment - 11/05/19 1507    Subjective   Patient reports her hands are doing better but she has also been baking less in the last couple weeks since being seen for therapy last.     Pertinent History  Pt has long standing history of inflammatory arthritis - had extended R 5th digit surgery in 2020 , and cont to have increase stiffness, tightness with pain in L hand more than R hand - with some increase thoracic pain - she do bake a lot , work some in the garden , and do her own house work    Patient Stated Goals  I would like the pain to be better    Currently in Pain?  Yes    Pain Score  2     Pain Location  Hand    Pain Orientation  Right;Left    Pain Descriptors / Indicators  Aching;Tightness;Tender    Pain Type  Chronic pain    Pain Onset  1 to 4 weeks ago    Pain Frequency  Constant    Multiple Pain Sites  No       Patient performing Moist heat in the am and pm, she does not have a home paraffin unit but is interested but unsure of the model to purchase, issued a couple of recommendations for her to view online and decide.  Performing Tendon glides pain free 10 reps Measurements taken as outlined in below chart  Joint protection principles review with pt - respect for pain - keep  under 2-3/10 - pain linger more than 2 hrs or increase pain 12-24 hrs later  modify - but using larger joints and avoid tight and prolonged grips   Use AE and look for tools to make her baking easier on her hands  Instructed on specific options for tools to utilize for her cooking making/baking to promote joint protection.  She liked the first option and is interested in trying [employer].com: Windspeed Pastry Icing Piping Bag Nozzle Tips Fondant Cake Sugar Craft Decor Pen Set: Forensic psychologist.com: [employer] E-Z Deco Icing Pen: Forensic psychologist.com: [employer] Deluxe Decorating Pen: Passenger transport manager: Merchandiser, retail - Designer, multimedia  Patient also instructed on joint protection principles specific to baking via [employer], issued handout for reference at home.     Response to tx:  Symptoms improved since her last tx session, thoracic pain lasted a couple days and patient has not engaged in cookie making since that time, she reports she does not have a consistent schedule for making cookies, depends on demand.  She attempts to incorporate breaks, changes up task often and tries to not overbook herself with orders.  She would like to consider a home paraffin unit as well as try one of the options for the pastry bag to avoid greater stress on her joints, specifically the pastry piping bag nozzle.  Patient to continue to implement strategies at home and follow up in a couple weeks with therapist.    [facility] OT Assessment - 11/05/19 1516      Strength   Right Hand Grip (lbs)  54    Right Hand Lateral Pinch  13 lbs    Right Hand 3 Point Pinch  14 lbs    Left Hand Grip (lbs)  50    Left Hand Lateral Pinch  12 lbs    Left Hand 3 Point Pinch  13 lbs      Right Hand AROM   R Index  MCP 0-90  90 Degrees    R Index PIP 0-100  100 Degrees    R Long  MCP 0-90  90 Degrees    R Long PIP 0-100  100 Degrees    R Ring  MCP 0-90  90 Degrees    R Ring PIP 0-100  90 Degrees    R Little  MCP 0-90  90 Degrees    R Little PIP 0-100  90 Degrees   -90     Left Hand AROM   L Index  MCP 0-90  90 Degrees    L Index PIP 0-100  100 Degrees    L Long  MCP 0-90  90 Degrees    L Long PIP 0-100  100 Degrees    L Ring  MCP 0-90  90 Degrees    L Ring PIP 0-100  90 Degrees    L Little  MCP 0-90  90 Degrees    L Little PIP 0-100  100 Degrees               OT Treatments/Exercises (OP) - 11/05/19 1517      RUE Paraffin   Number Minutes Paraffin  10 Minutes    RUE Paraffin Location  Hand      LUE Paraffin   Number Minutes Paraffin  10 Minutes    LUE Paraffin Location  Hand  OT Education - 11/05/19 1508    Education Details  joint protection principles, modifications of tools, alternative tools to try with baking    Person(s) Educated  Patient     Comprehension  Verbalized understanding;Returned demonstration       OT Short Term Goals - 10/22/18 1553      OT SHORT TERM GOAL #1   Title  Pt to be independent in adjusting rubberbands, doing flexion HEP 4-5x day , keep pain and edema decrease and use Diigit widget  in combination of  MC block strap     Status  Achieved      OT SHORT TERM GOAL #2   Title  Pain on PRWHE improve with more than 10  points     Baseline  pain on Aug and Sept was 43/50 on PRWHE  and now 27/50 (month ago) - cont to be 22/50 had pins ( digit Widget removed on 8th May )     Time  4    Period  Weeks    Status  On-going    Target Date  11/19/18        OT Long Term Goals - 10/19/19 1059      OT LONG TERM GOAL #1   Title  Pt to verbalize 3 joint protection prinicples implemented to decrease pain at rest to less than 3/10 in bilateral hands    Baseline  pain at rest 3-5/10 in L hand more than R hand - no knowledge on AE and joint protection - bake about 8 hrs day 5 x wk    Time  4    Period  Weeks    Status  New    Target Date  11/16/19      OT LONG TERM GOAL #2   Title  Pt report implementing Homeprogram of AROM , moist heat, joint protection and AE to decrease pain , stiffness and tightness in bilateral hands    Baseline  no knowledge on AE or joint protection - and pain 3-5/10 rest- stiffness and tightness in bilateral hands    Time  6    Period  Weeks    Status  New    Target Date  11/30/19            Plan - 11/05/19 1510    Clinical Impression Statement  Symptoms improved since her last tx session, thoracic pain lasted a couple days and patient has not engaged in cookie making since that time, she reports she does not have a consistent schedule for making cookies, depends on demand.  She attempts to incorporate breaks, changes up task often and tries to not overbook herself with orders.  She would like to consider a home paraffin unit as well as try one of the options for the pastry bag to  avoid greater stress on her joints, specifically the pastry piping bag nozzle.  Patient to continue to implement strategies at home and follow up in a couple weeks with therapist.    OT Occupational Profile and History  Problem Focused Assessment - Including review of records relating to presenting problem    Occupational performance deficits (Please refer to evaluation for details):  ADL's;IADL's;Rest and Sleep;Play;Leisure;Social Participation    Body Structure / Function / Physical Skills  ADL;IADL;Pain;UE functional use;Flexibility;ROM    Rehab Potential  Fair    Clinical Decision Making  Limited treatment options, no task modification necessary    Comorbidities Affecting Occupational Performance:  May have comorbidities impacting occupational performance  inflammatory arthritis   Modification or Assistance to Complete Evaluation   No modification of tasks or assist necessary to complete eval    OT Frequency  Biweekly    OT Duration  6 weeks    OT Treatment/Interventions  Self-care/ADL training;Therapeutic exercise;Patient/family education;Paraffin;Manual Therapy;Passive range of motion;DME and/or AE instruction;Moist Heat    Plan  assess implementation of joint protection and AE    OT Home Exercise Plan  see pt instruction    Consulted and Agree with Plan of Care  Patient       Patient will benefit from skilled therapeutic intervention in order to improve the following deficits and impairments:   Body Structure / Function / Physical Skills: ADL, IADL, Pain, UE functional use, Flexibility, ROM       Visit Diagnosis: Pain in left hand  Pain in right hand  Stiffness of left hand, not elsewhere classified  Stiffness of right hand, not elsewhere classified  Localized edema  Muscle weakness (generalized)    Problem List Patient Active Problem List   Diagnosis Date Noted  . Encounter for gynecological examination without abnormal finding 04/06/2015  . Mixed incontinence  04/06/2015  . 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 Bursitis of shoulder 12/29/2014  . Tendinitis of right shoulder 12/29/2014  . Other shoulder lesions, right shoulder 12/29/2014  . Episode of syncope 05/11/2014  . Atrial flutter ([place]) 09/05/2012  . CFIDS (chronic fatigue and immune dysfunction syndrome) ([place]) 09/05/2012  . 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 Postural orthostatic tachycardia syndrome 08/30/2011  . Fast heart beat 08/30/2011  . Supraventricular arrhythmia 05/28/2011  . UNSPECIFIED MYOCARDITIS 07/20/2010  . SINUS TACHYCARDIA 07/18/2010  . DYSAUTONOMIA 07/18/2010  . PALPITATIONS, RECURRENT 07/18/2010  . DYSPNEA ON EXERTION 07/18/2010  . 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   Conard Alvira T Tamaka Sawin, OTR/L, CLT  Damya Comley 11/05/2019, 3:36 PM  [facility] PHYSICAL AND SPORTS MEDICINE [address] [place], [address] Phone: [phone]   Fax:  [phone]  Name: Moani Weipert MRN: [id number] Date of Birth: December 30, 1970

## 2019-11-16 ENCOUNTER — Ambulatory Visit: Payer: Medicaid Other | Admitting: Occupational Therapy

## 2019-11-24 ENCOUNTER — Ambulatory Visit: Payer: Medicaid Other | Attending: Rheumatology | Admitting: Occupational Therapy

## 2019-11-24 ENCOUNTER — Other Ambulatory Visit: Payer: Self-pay

## 2019-11-24 DIAGNOSIS — M79642 Pain in left hand: Secondary | ICD-10-CM | POA: Insufficient documentation

## 2019-11-24 DIAGNOSIS — M25642 Stiffness of left hand, not elsewhere classified: Secondary | ICD-10-CM | POA: Diagnosis present

## 2019-11-24 DIAGNOSIS — M25641 Stiffness of right hand, not elsewhere classified: Secondary | ICD-10-CM | POA: Insufficient documentation

## 2019-11-24 DIAGNOSIS — M79641 Pain in right hand: Secondary | ICD-10-CM | POA: Insufficient documentation

## 2019-11-24 NOTE — Patient Instructions (Signed)
See note

## 2019-11-24 NOTE — Therapy (Signed)
[facility] [address] [place], [address] Phone: [phone]   Fax:  [phone]  Occupational Therapy Treatment  Patient Details  Name: Lisa Clay MRN: [id number] Date of Birth: 1971-03-28 No data recorded  Encounter Date: 11/24/2019   OT End of Session - 11/24/19 1244    Visit Number 3    Number of Visits 3    Date for OT Re-Evaluation 11/24/19    Authorization - Visit Number 2    Authorization - Number of Visits 3    OT Start Time 0948    OT Stop Time 1030    OT Time Calculation (min) 42 min    Activity Tolerance Patient tolerated treatment well    Behavior During Therapy [facility] for tasks assessed/performed           Past Medical History:  Diagnosis Date  . Atrial tachycardia ([place])   . Dysrhythmia    Hx: PSVT. Current: Postural Orthostatic Tachycardia Syndrome, Inappropriate Sinus Node Tachycardia  . Headache    migraines, rare.  . Kidney stones   . Palpitations    [facility] 2/12  . Shortness of breath dyspnea    on exertion, secondary to heart issues  . Sinus tachycardia    [facility] 2/12  . Syncope    secondary to heart issues    Past Surgical History:  Procedure Laterality Date  . BREAST BIOPSY Left 05/11/2013   stereo biopsy  . CARDIAC ELECTROPHYSIOLOGY STUDY AND ABLATION  04/24/11   [facility]  . CLOSED MANIPULATION SHOULDER WITH STERIOD INJECTION Right 02/16/2015   Procedure: CLOSED MANIPULATION SHOULDER WITH STEROID INJECTION;  Surgeon: Corky Mull, MD;  Location: [place];  Service: Orthopedics;  Laterality: Right;  . FINGER SURGERY Right 12/25/2017   explorative surgery on R5th PIP   . TONSILLECTOMY      There were no vitals filed for this visit.   Subjective Assessment - 11/24/19 1241    Subjective  I am doing okay - I did not bake to much or had to deep clean - so my hands dong okay - just stiff in the am , ache - but not more than 2/10 - but in  the mornings always like that - my thoracic ok - but need to look at my chair and table setup where I bake    Pertinent History Pt has long standing history of inflammatory arthritis - had extended R 5th digit surgery in 2020 , and cont to have increase stiffness, tightness with pain in L hand more than R hand - with some increase thoracic pain - she do bake a lot , work some in the garden , and do her own house work    Patient Stated Goals I would like the pain to be better    Currently in Pain? Yes    Pain Score 2     Pain Location Hand    Pain Orientation Right;Left    Pain Descriptors / Indicators Aching;Tightness    Pain Type Chronic pain    Pain Onset More than a month ago    Pain Frequency Constant              [facility] OT Assessment - 11/24/19 0001      Strength   Right Hand Grip (lbs) 54    Right Hand Lateral Pinch 13 lbs    Right Hand 3 Point Pinch 14 lbs    Left Hand Grip (lbs) 50  Left Hand Lateral Pinch 12 lbs    Left Hand 3 Point Pinch 13 lbs      Right Hand AROM   R Index  MCP 0-90 90 Degrees    R Index PIP 0-100 100 Degrees    R Long  MCP 0-90 90 Degrees    R Long PIP 0-100 100 Degrees    R Ring  MCP 0-90 90 Degrees    R Ring PIP 0-100 90 Degrees    R Little  MCP 0-90 90 Degrees    R Little PIP 0-100 90 Degrees   -90     Left Hand AROM   L Index  MCP 0-90 90 Degrees    L Index PIP 0-100 100 Degrees    L Long  MCP 0-90 90 Degrees    L Long PIP 0-100 100 Degrees    L Ring  MCP 0-90 90 Degrees    L Ring PIP 0-100 90 Degrees   -30   L Little  MCP 0-90 90 Degrees    L Little PIP 0-100 100 Degrees             Patient performing Moist heat in the am and pm, she does not have a home paraffin unit but planning to get one  -info provided last time for pt -few models to purchase, issued a couple of recommendations for her to view online and decide.   Joint protection principles review with pt - respect for pain - keep under 2-3/10 - pain linger more than 2  hrs or increase pain 12-24 hrs later modify - but using larger joints and avoid tight and prolonged grips  Pt to decrease pain to maintain her motion and strength- reinforce again   Use AE and look for tools to make her baking easier on her hands  Instructed  Last time on specific options for tools to utilize for her cooking making/baking to promote joint protection.  She liked the first option and is interested in trying [employer]: Wind[employer] Pastry Icing Piping Bag Nozzle Tips Fondant Cake Sugar Craft Decor Pen Set: Kitchen & Dining   Also discuss height for table and chair and setup for baking and decorations - height and rest breaks        OT Treatments/Exercises (OP) - 11/24/19 0001      RUE Paraffin   Number Minutes Paraffin 8 Minutes    RUE Paraffin Location Hand    Comments prior to soft tissue       LUE Paraffin   Number Minutes Paraffin 8 Minutes    LUE Paraffin Location Hand    Comments prior to soft tissue - LMB PIP extention splint on 4th PIP          graston tool nr 2 done brushing on L 4th volar digit to increase extention - pt to cont with gentle PROM and tendon glides - pain free           OT Education - 11/24/19 1244    Education Details joint protection principles, modifications of tools, alternative tools to try with baking    Person(s) Educated Patient    Methods Demonstration;Handout;Verbal cues;Explanation    Comprehension Verbalized understanding;Returned demonstration               OT Long Term Goals - 11/24/19 1250      OT LONG TERM GOAL #1   Title Pt to verbalize 3 joint protection prinicples implemented to decrease pain at rest to less than  3/10 in bilateral hands    Baseline knowledge on AE, modifications, and pain and stiffness 2/10 if not over doing baking and cleaning    Status Achieved      OT LONG TERM GOAL #2   Title Pt report implementing Homeprogram of AROM , moist heat, joint protection and AE to decrease pain ,  stiffness and tightness in bilateral hands    Status Achieved                 Plan - 11/24/19 1249    Clinical Impression Statement Pt doing very well in AROM of digits and grip/prehension strength- but she od have increase pain and stiffness when she over do her baking and cleaning - recommend pt to get paraffin bath to use to decrease pain and stiffness and maintain her ROM and strength - she was ed on modifications and joint protection - HEP - pt to cont at home at this time    OT Occupational Profile and History Problem Focused Assessment - Including review of records relating to presenting problem    Occupational performance deficits (Please refer to evaluation for details): ADL's;IADL's;Rest and Sleep;Play;Leisure;Social Participation    Body Structure / Function / Physical Skills ADL;IADL;Pain;UE functional use;Flexibility;ROM    Rehab Potential Fair    Clinical Decision Making Limited treatment options, no task modification necessary    Comorbidities Affecting Occupational Performance: May have comorbidities impacting occupational performance    Modification or Assistance to Complete Evaluation  No modification of tasks or assist necessary to complete eval    Plan discharge instructions    OT Home Exercise Plan see pt instruction    Consulted and Agree with Plan of Care Patient           Patient will benefit from skilled therapeutic intervention in order to improve the following deficits and impairments:   Body Structure / Function / Physical Skills: ADL, IADL, Pain, UE functional use, Flexibility, ROM       Visit Diagnosis: Pain in left hand  Pain in right hand  Stiffness of left hand, not elsewhere classified  Stiffness of right hand, not elsewhere classified    Problem List Patient Active Problem List   Diagnosis Date Noted  . Encounter for gynecological examination without abnormal finding 04/06/2015  . Mixed incontinence 04/06/2015  . 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 Bursitis of shoulder 12/29/2014  . Tendinitis of right shoulder 12/29/2014  . Other shoulder lesions, right shoulder 12/29/2014  . Episode of syncope 05/11/2014  . Atrial flutter (HCC) 09/05/2012  . CFIDS (chronic fatigue and immune dysfunction syndrome) (HCC) 09/05/2012  . 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 Postural orthostatic tachycardia syndrome 08/30/2011  . Fast heart beat 08/30/2011  . Supraventricular arrhythmia 05/28/2011  . UNSPECIFIED MYOCARDITIS 07/20/2010  . SINUS TACHYCARDIA 07/18/2010  . DYSAUTONOMIA 07/18/2010  . PALPITATIONS, RECURRENT 07/18/2010  . DYSPNEA ON EXERTION 07/18/2010  . 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    Oletta Cohn OTR/L,CLT 11/24/2019, 12:53 PM  [facility] [address] [place], [address] Phone: [phone]   Fax:  [phone]  Name: Lisa Clay MRN: [id number] Date of Birth: Aug 26, 1970

## 2020-01-30 ENCOUNTER — Ambulatory Visit
Admission: EM | Admit: 2020-01-30 | Discharge: 2020-01-30 | Disposition: A | Discharge status: Home / Self Care | Payer: Medicaid Other | Attending: Family Medicine | Admitting: Family Medicine

## 2020-01-30 ENCOUNTER — Encounter: Payer: Self-pay | Admitting: Emergency Medicine

## 2020-01-30 ENCOUNTER — Other Ambulatory Visit: Payer: Self-pay

## 2020-01-30 DIAGNOSIS — R21 Rash and other nonspecific skin eruption: Secondary | ICD-10-CM

## 2020-01-30 MED ORDER — IVERMECTIN 3 MG PO TABS: 200.0000 ug/kg | ORAL_TABLET | Freq: Once | ORAL | 0 refills | Status: AC

## 2020-01-30 MED ORDER — PERMETHRIN 5 % EX CREA: TOPICAL_CREAM | CUTANEOUS | 0 refills | Status: DC

## 2020-01-30 MED ORDER — HYDROXYZINE HCL 25 MG PO TABS: 25.0000 mg | ORAL_TABLET | Freq: Three times a day (TID) | ORAL | 0 refills | Status: DC | PRN

## 2020-01-30 MED ORDER — TRIAMCINOLONE ACETONIDE 0.5 % EX OINT: 1.0000 "application " | TOPICAL_OINTMENT | Freq: Two times a day (BID) | CUTANEOUS | 0 refills | Status: DC

## 2020-01-30 NOTE — ED Provider Notes (Signed)
[facility]    CSN: [id number] Arrival date & time: 01/30/20  1228      History   Chief Complaint Chief Complaint  Patient presents with  . Rash   HPI  49 year old female presents with rash.  Patient has rash to her hands as well as her wrists.  Also has some rash on the left low back/buttock.  Patient reports that she is recently traveled to the beach and stated a hotel.  Her son seems to have a similar appearing rash as well.  She states that it is very itchy.  Keeping her up at night.  Also is painful.  No new changes or exposures other than going to the beach and staying in a hotel.  She has been applying Clorox as well as using hot water without relief.  She has also been taking Benadryl without relief.  No other reported symptoms.  No other complaints.  Past Medical History:  Diagnosis Date  . Atrial tachycardia (HCC)   . Dysrhythmia    Hx: PSVT. Current: Postural Orthostatic Tachycardia Syndrome, Inappropriate Sinus Node Tachycardia  . Headache    migraines, rare.  . Kidney stones   . Palpitations    [facility] 2/12  . Shortness of breath dyspnea    on exertion, secondary to heart issues  . Sinus tachycardia    [facility] 2/12  . Syncope    secondary to heart issues    Patient Active Problem List   Diagnosis Date Noted  . Encounter for gynecological examination without abnormal finding 04/06/2015  . Mixed incontinence 04/06/2015  . 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 Bursitis of shoulder 12/29/2014  . Tendinitis of right shoulder 12/29/2014  . Other shoulder lesions, right shoulder 12/29/2014  . Episode of syncope 05/11/2014  . Atrial flutter (HCC) 09/05/2012  . CFIDS (chronic fatigue and immune dysfunction syndrome) (HCC) 09/05/2012  . 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 Postural orthostatic tachycardia syndrome 08/30/2011  . Fast heart beat 08/30/2011  . Supraventricular arrhythmia 05/28/2011  . UNSPECIFIED MYOCARDITIS 07/20/2010  . SINUS  TACHYCARDIA 07/18/2010  . DYSAUTONOMIA 07/18/2010  . PALPITATIONS, RECURRENT 07/18/2010  . DYSPNEA ON EXERTION 07/18/2010  . 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 BREAST BIOPSY Left 05/11/2013   stereo biopsy  . CARDIAC ELECTROPHYSIOLOGY STUDY AND ABLATION  04/24/11   [facility]  . CLOSED MANIPULATION SHOULDER WITH STERIOD INJECTION Right 02/16/2015   Procedure: CLOSED MANIPULATION SHOULDER WITH STEROID INJECTION;  Surgeon: Christena Flake, MD;  [facility];  Service: Orthopedics;  Laterality: Right;  . FINGER SURGERY Right 12/25/2017   explorative surgery on R5th PIP   . TONSILLECTOMY      OB History   No obstetric history on file.      Home Medications    Prior to Admission medications   Medication Sig Start Date End Date Taking? Authorizing Provider  Calcium Citrate-Vitamin D (CALCIUM + D PO) Take by mouth.   Yes [provider]  CORLANOR 5 MG TABS tablet Take 5 mg by mouth 2 (two) times daily. 01/07/15  Yes [provider]  ENBREL SURECLICK 50 MG/ML injection Inject into the skin once a week. 01/25/20  Yes [provider]  fludrocortisone (FLORINEF) 0.1 MG tablet Take 100 mcg by mouth daily. 01/28/15  Yes [provider]  folic acid (FOLVITE) 1 MG tablet Take 1 tablet by mouth daily. 02/03/19  Yes [provider]  gabapentin (NEURONTIN) 100 MG  capsule Take 400 mg by mouth at bedtime.    Yes [provider]  indomethacin (INDOCIN) 50 MG capsule Take 50 mg by mouth 2 (two) times daily with a meal.   Yes [provider]  methotrexate (RHEUMATREX) 2.5 MG tablet Take by mouth. 12/28/19  Yes [provider]  methylphenidate (RITALIN) 10 MG tablet Take 10 mg by mouth 2 (two) times daily.   Yes [provider]  Multiple Vitamin (MULTIVITAMIN) capsule Take 1 capsule by mouth daily.   Yes [provider]  nadolol (CORGARD)  20 MG tablet Take 10 mg by mouth daily.   Yes [provider]  potassium citrate (UROCIT-K) 10 MEQ (1080 MG) SR tablet Take 2 tablets (20 mEq total) by mouth 2 (two) times daily with a meal. 04/14/15  Yes Hildred Laser, MD  SUMAtriptan (IMITREX) 50 MG tablet TAKE 1 TAB AT EARLIEST ONSET OF SYMPTOMS. MAY TAKE A SECOND DOSE AFTER 2 HOURS IF NEEDED. 08/15/18  Yes [provider]  topiramate (TOPAMAX) 25 MG tablet TAKE 1 TABLET (25 MG TOTAL) BY MOUTH 3 (THREE) TIMES A DAY. 12/03/14  Yes [provider]  vitamin B-12 (CYANOCOBALAMIN) 1000 MCG tablet Take 1,000 mcg by mouth daily.   Yes [provider]  zolpidem (AMBIEN) 5 MG tablet Take 5-10 mg by mouth at bedtime as needed.     Yes [provider]  hydrOXYzine (ATARAX/VISTARIL) 25 MG tablet Take 1 tablet (25 mg total) by mouth every 8 (eight) hours as needed for itching. 01/30/20   Tommie Sams, DO  ivermectin (STROMECTOL) 3 MG TABS tablet Take 3.5 tablets (10,500 mcg total) by mouth once for 1 dose. 01/30/20 01/30/20  Tommie Sams, DO  permethrin (ELIMITE) 5 % cream Thoroughly massage cream (30 g for average adult) from head to soles of feet; leave on for 8 to 14 hours before bath/shower. 01/30/20   Tommie Sams, DO  triamcinolone ointment (KENALOG) 0.5 % Apply 1 application topically 2 (two) times daily. 01/30/20   Tommie Sams, DO  levonorgestrel (MIRENA) 20 MCG/24HR IUD 1 each by Intrauterine route once.    01/30/20  [provider]  propranolol (INDERAL) 20 MG tablet TAKE 1 TABLET (20 MG TOTAL) BY MOUTH 4 TIMES DAILY. 12/03/14 01/30/20  [provider]    Family History Family History  Problem Relation Age of Onset  . Heart attack Maternal Grandmother 50       deceased  . Hypertension Father        and high cholesterol   . Hypertension Mother        and high cholesterol   . Breast cancer Neg Hx     Social History Social History   Tobacco Use  . Smoking status: Never Smoker    . Smokeless tobacco: Never Used  . Tobacco comment: tobacco use - no   Vaping Use  . Vaping Use: Never used  Substance Use Topics  . Alcohol use: No    Comment: rare  . Drug use: No     Allergies   Erythromycin   Review of Systems Review of Systems  Skin: Positive for rash.   Physical Exam Triage Vital Signs ED Triage Vitals  Enc Vitals Group     BP 01/30/20 1308 (!) 146/81     Pulse Rate 01/30/20 1308 69     Resp 01/30/20 1308 14     Temp 01/30/20 1308 98.4 F (36.9 C)     Temp Source 01/30/20  1308 Oral     SpO2 01/30/20 1308 97 %     Weight 01/30/20 1304 115 lb (52.2 kg)     Height 01/30/20 1304 5\' 6"  (1.676 m)     Head Circumference --      Peak Flow --      Pain Score 01/30/20 1304 0     Pain Loc --      Pain Edu? --      Excl. in GC? --    Updated Vital Signs BP (!) 146/81 (BP Location: Left Arm)   Pulse 69   Temp 98.4 F (36.9 C) (Oral)   Resp 14   Ht 5\' 6"  (1.676 m)   Wt 52.2 kg   LMP 01/22/2020 (Exact Date)   SpO2 97%   BMI 18.56 kg/m   Visual Acuity Right Eye Distance:   Left Eye Distance:   Bilateral Distance:    Right Eye Near:   Left Eye Near:    Bilateral Near:     Physical Exam Vitals and nursing note reviewed.  Constitutional:      General: She is not in acute distress.    Appearance: Normal appearance.  HENT:     Head: Normocephalic and atraumatic.  Eyes:     General:        Right eye: No discharge.        Left eye: No discharge.     Conjunctiva/sclera: Conjunctivae normal.  Pulmonary:     Effort: Pulmonary effort is normal. No respiratory distress.  Skin:    Comments: Erythematous, papular rash noted on the wrists, hands.  She also has rash on anterior neck as well as the left superior buttock.  Neurological:     Mental Status: She is alert.  Psychiatric:     Comments: Anxious.    UC Treatments / Results  Labs (all labs ordered are listed, but only abnormal results are displayed) Labs Reviewed - No data to  display  EKG   Radiology No results found.  Procedures Procedures (including critical care time)  Medications Ordered in UC Medications - No data to display  Initial Impression / Assessment and Plan / UC Course  I have reviewed the triage vital signs and the nursing notes.  Pertinent labs & imaging results that were available during my care of the patient were reviewed by me and considered in my medical decision making (see chart for details).    49 year old female presents with a papular rash.  Given recent travel and highly pruritic nature, I am concerned about scabies.  Patient requesting ivermectin.  Rx sent.  Hydroxyzine for itching as well as triamcinolone ointment.  Permethrin was also sent initially before patient requested ivermectin.  I have advised her that she can use this for treatment for her son.  Final Clinical Impressions(s) / UC Diagnoses   Final diagnoses:  Rash     Discharge Instructions     Medications as prescribed.  Take care  Dr. 01/24/2020    ED Prescriptions    Medication Sig Dispense Auth. Provider   hydrOXYzine (ATARAX/VISTARIL) 25 MG tablet Take 1 tablet (25 mg total) by mouth every 8 (eight) hours as needed for itching. 30 tablet Polina Burmaster G, DO   permethrin (ELIMITE) 5 % cream Thoroughly massage cream (30 g for average adult) from head to soles of feet; leave on for 8 to 14 hours before bath/shower. 60 g Jannelle Notaro G, DO   triamcinolone ointment (KENALOG) 0.5 % Apply 1 application topically  2 (two) times daily. 30 g Elenor Wildes G, DO   ivermectin (STROMECTOL) 3 MG TABS tablet Take 3.5 tablets (10,500 mcg total) by mouth once for 1 dose. 4 tablet Tommie Samsook, Felix Meras G, DO     PDMP not reviewed this encounter.   Tommie SamsCook, Kierre Deines G, DO 01/30/20 1420

## 2020-01-30 NOTE — ED Triage Notes (Signed)
Patient c/o itchy rash on her hands and wrist that started Wed night.

## 2020-01-30 NOTE — Discharge Instructions (Signed)
Medications as prescribed. ° °Take care ° °Dr. Jan Olano  °

## 2020-08-31 ENCOUNTER — Other Ambulatory Visit: Payer: Self-pay | Admitting: Family Medicine

## 2020-08-31 DIAGNOSIS — Z1231 Encounter for screening mammogram for malignant neoplasm of breast: Secondary | ICD-10-CM

## 2020-09-16 ENCOUNTER — Ambulatory Visit
Admission: RE | Admit: 2020-09-16 | Discharge: 2020-09-16 | Disposition: A | Discharge status: Home / Self Care | Payer: Medicaid Other | Source: Ambulatory Visit | Attending: Family Medicine | Admitting: Family Medicine

## 2020-09-16 ENCOUNTER — Other Ambulatory Visit: Payer: Self-pay

## 2020-09-16 DIAGNOSIS — Z1231 Encounter for screening mammogram for malignant neoplasm of breast: Secondary | ICD-10-CM | POA: Diagnosis present

## 2021-09-25 ENCOUNTER — Other Ambulatory Visit: Payer: Self-pay | Admitting: Family Medicine

## 2021-09-25 DIAGNOSIS — Z1231 Encounter for screening mammogram for malignant neoplasm of breast: Secondary | ICD-10-CM

## 2021-10-24 DIAGNOSIS — M47819 Spondylosis without myelopathy or radiculopathy, site unspecified: Secondary | ICD-10-CM | POA: Insufficient documentation

## 2021-10-27 ENCOUNTER — Ambulatory Visit
Admission: RE | Admit: 2021-10-27 | Discharge: 2021-10-27 | Disposition: A | Discharge status: Home / Self Care | Payer: Medicaid Other | Source: Ambulatory Visit | Attending: Family Medicine | Admitting: Family Medicine

## 2021-10-27 DIAGNOSIS — Z1231 Encounter for screening mammogram for malignant neoplasm of breast: Secondary | ICD-10-CM

## 2022-11-15 ENCOUNTER — Other Ambulatory Visit: Payer: Self-pay | Admitting: Family Medicine

## 2022-11-15 DIAGNOSIS — Z1231 Encounter for screening mammogram for malignant neoplasm of breast: Secondary | ICD-10-CM

## 2022-11-23 ENCOUNTER — Ambulatory Visit
Admission: RE | Admit: 2022-11-23 | Discharge: 2022-11-23 | Disposition: A | Discharge status: Home / Self Care | Payer: Medicaid Other | Source: Ambulatory Visit | Attending: Family Medicine | Admitting: Family Medicine

## 2022-11-23 DIAGNOSIS — Z1231 Encounter for screening mammogram for malignant neoplasm of breast: Secondary | ICD-10-CM

## 2022-11-28 ENCOUNTER — Other Ambulatory Visit: Payer: Self-pay | Admitting: Family Medicine

## 2022-11-28 DIAGNOSIS — N63 Unspecified lump in unspecified breast: Secondary | ICD-10-CM

## 2022-11-28 DIAGNOSIS — R928 Other abnormal and inconclusive findings on diagnostic imaging of breast: Secondary | ICD-10-CM

## 2022-12-04 ENCOUNTER — Ambulatory Visit
Admission: RE | Admit: 2022-12-04 | Discharge: 2022-12-04 | Disposition: A | Discharge status: Home / Self Care | Payer: Medicaid Other | Source: Ambulatory Visit | Attending: Family Medicine | Admitting: Family Medicine

## 2022-12-04 DIAGNOSIS — R928 Other abnormal and inconclusive findings on diagnostic imaging of breast: Secondary | ICD-10-CM | POA: Diagnosis present

## 2022-12-04 DIAGNOSIS — N63 Unspecified lump in unspecified breast: Secondary | ICD-10-CM | POA: Insufficient documentation

## 2022-12-19 LAB — COLOGUARD

## 2022-12-19 LAB — EXTERNAL GENERIC LAB PROCEDURE

## 2022-12-24 ENCOUNTER — Other Ambulatory Visit: Payer: Self-pay

## 2022-12-24 DIAGNOSIS — N390 Urinary tract infection, site not specified: Secondary | ICD-10-CM

## 2022-12-25 ENCOUNTER — Encounter: Payer: Self-pay | Admitting: Urology

## 2022-12-25 ENCOUNTER — Ambulatory Visit: Payer: Medicaid Other | Admitting: Urology

## 2022-12-25 VITALS — BP 132/86 | HR 96 | Ht 66.0 in | Wt 122.0 lb

## 2022-12-25 DIAGNOSIS — R3915 Urgency of urination: Secondary | ICD-10-CM | POA: Diagnosis not present

## 2022-12-25 DIAGNOSIS — Z87440 Personal history of urinary (tract) infections: Secondary | ICD-10-CM

## 2022-12-25 DIAGNOSIS — R310 Gross hematuria: Secondary | ICD-10-CM | POA: Diagnosis not present

## 2022-12-25 DIAGNOSIS — R350 Frequency of micturition: Secondary | ICD-10-CM

## 2022-12-25 DIAGNOSIS — N390 Urinary tract infection, site not specified: Secondary | ICD-10-CM

## 2022-12-25 DIAGNOSIS — R300 Dysuria: Secondary | ICD-10-CM | POA: Diagnosis not present

## 2022-12-25 MED ORDER — DOXYCYCLINE HYCLATE 100 MG PO CAPS: 100.0000 mg | ORAL_CAPSULE | Freq: Two times a day (BID) | ORAL | 0 refills | Status: DC

## 2022-12-25 NOTE — Progress Notes (Signed)
12/25/22 12:14 PM   Lisa Clay 06-Nov-1970 [id number]  CC: Recurrent UTI, gross hematuria, dysuria, history of kidney stones  HPI: 52 year old female who reports at least 6 weeks of urinary symptoms with intermittent gross hematuria, dysuria, urgency/frequency, and pelvic discomfort.  During this time she has had multiple urine cultures positive for Klebsiella, has not had improvement with antibiotics despite courses of Augmentin, Bactrim, and Cipro.  She is not sexually active, denies any possibility of STDs, but she has a history of kidney stones, but states this feels different, denies any flank pain.   Most recent urine sample on 12/18/2022 appears grossly infected with 100 WBC, 8 RBC, 2+ leukocytes, but culture was negative.  Her periods have become more irregular, she thinks she is going through menopause.  PMH: Past Medical History:  Diagnosis Date   Atrial tachycardia    Dysrhythmia    Hx: PSVT. Current: Postural Orthostatic Tachycardia Syndrome, Inappropriate Sinus Node Tachycardia   Headache    migraines, rare.   Kidney stones    Palpitations    [facility] 2/12   Shortness of breath dyspnea    on exertion, secondary to heart issues   Sinus tachycardia    [facility] 2/12   Syncope    secondary to heart issues    Surgical History: Past Surgical History:  Procedure Laterality Date   BREAST BIOPSY Left 05/11/2013   stereo biopsy   CARDIAC ELECTROPHYSIOLOGY STUDY AND ABLATION  04/24/11   [facility]   CLOSED MANIPULATION SHOULDER WITH STERIOD INJECTION Right 02/16/2015   Procedure: CLOSED MANIPULATION SHOULDER WITH STEROID INJECTION;  Surgeon: Christena Flake, MD;  [facility];  Service: Orthopedics;  Laterality: Right;   FINGER SURGERY Right 12/25/2017   explorative surgery on R5th PIP    TONSILLECTOMY       Family History: Family History  Problem Relation Age of Onset   Heart attack Maternal Grandmother 50       deceased    Hypertension Father        and high cholesterol    Hypertension Mother        and high cholesterol    Breast cancer Neg Hx     Social History:  reports that she has never smoked. She has never used smokeless tobacco. She reports that she does not drink alcohol and does not use drugs.  Physical Exam: BP 132/86 (BP Location: Left Arm, Patient Position: Sitting, Cuff Size: Normal)   Pulse 96   Ht 5\' 6"  (1.676 m)   Wt 122 lb (55.3 kg)   BMI 19.69 kg/m    Constitutional:  Alert and oriented, No acute distress. Cardiovascular: No clubbing, cyanosis, or edema. Respiratory: Normal respiratory effort, no increased work of breathing. GI: Abdomen is soft, nontender, nondistended, no abdominal masses  Laboratory Data: See HPI, culture data in Care Everywhere  Assessment & Plan:   52 year old female with 6 weeks of urinary symptoms including dysuria, urgency/frequency, and gross hematuria, multiple positive Klebsiella urine cultures, no improvement on antibiotics, recent urine culture negative and has had persistent gross hematuria of unclear etiology as well as persistent symptoms.  We discussed the evaluation and treatment of patients with recurrent UTIs at length.  We specifically discussed the differences between asymptomatic bacteriuria and true urinary tract infection.  We discussed the AUA definition of recurrent UTI of at least 2 culture proven symptomatic acute cystitis episodes in a 72-month period, or 3 within a 1 year period.  We discussed the importance of  culture directed antibiotic treatment, and antibiotic stewardship.  First-line therapy includes nitrofurantoin(5 days), Bactrim(3 days), or fosfomycin(3 g single dose).  Possible etiologies of recurrent infection include periurethral tissue atrophy in postmenopausal woman, constipation, sexual activity, incomplete emptying, anatomic abnormalities, and even genetic predisposition.  Finally, we discussed the role of perineal hygiene, timed  voiding, adequate hydration, topical vaginal estrogen, cranberry prophylaxis, and low-dose antibiotic prophylaxis.  -We reviewed possible etiologies of her recurrent/persistent UTIs and gross hematuria including urethritis, urethral diverticulum, stone disease, obstruction, mass/malignancy, genitourinary syndrome of menopause.  With her persistent symptoms despite multiple courses of antibiotics, as well as significant gross hematuria, I recommended further evaluation with CT urogram and cystoscopy. -Trial of doxycycline for possible atypical infection/urethritis, 100 mg twice daily x 7 days  Legrand Rams, MD 12/25/2022  [facility] [address] [place], [place] [address] [phone]

## 2022-12-25 NOTE — Patient Instructions (Signed)

## 2023-01-11 ENCOUNTER — Ambulatory Visit
Admission: RE | Admit: 2023-01-11 | Discharge: 2023-01-11 | Disposition: A | Discharge status: Home / Self Care | Payer: Medicaid Other | Source: Ambulatory Visit | Attending: Urology | Admitting: Urology

## 2023-01-11 DIAGNOSIS — R310 Gross hematuria: Secondary | ICD-10-CM | POA: Insufficient documentation

## 2023-01-11 MED ORDER — IOHEXOL 300 MG/ML  SOLN
100.0000 mL | Freq: Once | INTRAMUSCULAR | Status: AC | PRN
  Administered 2023-01-11: 100 mL via INTRAVENOUS

## 2023-01-11 MED ORDER — SODIUM CHLORIDE 0.9 % IV SOLN: INTRAVENOUS | Status: DC

## 2023-01-15 ENCOUNTER — Encounter: Payer: Self-pay | Admitting: Urology

## 2023-01-15 ENCOUNTER — Ambulatory Visit: Payer: Medicaid Other | Admitting: Urology

## 2023-01-15 VITALS — BP 112/77 | HR 92 | Ht 66.0 in | Wt 122.0 lb

## 2023-01-15 DIAGNOSIS — N200 Calculus of kidney: Secondary | ICD-10-CM | POA: Diagnosis not present

## 2023-01-15 DIAGNOSIS — R310 Gross hematuria: Secondary | ICD-10-CM

## 2023-01-15 DIAGNOSIS — R399 Unspecified symptoms and signs involving the genitourinary system: Secondary | ICD-10-CM | POA: Diagnosis not present

## 2023-01-15 DIAGNOSIS — N3289 Other specified disorders of bladder: Secondary | ICD-10-CM

## 2023-01-15 NOTE — Progress Notes (Signed)
Cystoscopy Procedure Note:  Indication: Gross hematuria  52 year old female who I originally met on 12/25/2022 for at least 6 weeks of urinary symptoms with intermittent gross hematuria, dysuria, urgency/frequency, and pelvic discomfort.  Cultures have been positive for Klebsiella and was treated with antibiotics without significant improvement, most recent urinalysis appeared grossly infected and abnormal with pyuria and microscopic hematuria but culture was negative.  At our last visit we opted for a trial of doxycycline for possible atypical infection-she did not think that made a big difference short-term, however urinary symptoms seem to have improved over the last week or two.  After informed consent and discussion of the procedure and its risks, Lisa Clay was positioned and prepped in the standard fashion. Cystoscopy was performed with a flexible cystoscope. The urethra, bladder neck and entire bladder was visualized in a standard fashion.  The ureteral orifices were visualized in their normal location and orientation.  Mild to moderate erythema of the left lateral bladder wall and posterior wall, no definite tumors or lesions, no abnormalities on retroflexion.  Cytology sent.  Imaging: Nonobstructing left renal stones on CT, formal read pending  Findings: Mild to moderate bladder erythema, follow-up cytology  Assessment and Plan: We discussed possible etiologies including CIS, pelvic floor dysfunction, interstitial cystitis, genitourinary syndrome of menopause.  Follow-up final CT report and urine cytology, if suspicious/positive will schedule bladder biopsy  If cytology negative, would recommend trial of topical estrogen cream, consider referral to pelvic floor PT if no improvement  Legrand Rams, MD 01/15/2023

## 2023-01-23 ENCOUNTER — Other Ambulatory Visit: Payer: Self-pay

## 2023-01-23 ENCOUNTER — Telehealth: Payer: Self-pay

## 2023-01-23 DIAGNOSIS — N390 Urinary tract infection, site not specified: Secondary | ICD-10-CM

## 2023-01-23 MED ORDER — PREMARIN 0.625 MG/GM VA CREA
TOPICAL_CREAM | VAGINAL | 12 refills | Status: AC
Start: 2023-01-23 — End: ?
  Filled 2023-01-23: qty 30, 90d supply, fill #0

## 2023-01-23 NOTE — Telephone Encounter (Signed)
See my chart message

## 2023-01-23 NOTE — Telephone Encounter (Signed)
-----   Message from Sondra Come sent at 01/22/2023  3:17 PM EDT ----- Regarding: Cytology results, estrogen cream Good news, no worrisome findings on urine sample.  Would recommend topical estrogen cream as discussed in clinic, please review instructions and send prescription, RTC with PA 3 months symptom check  Legrand Rams, MD 01/22/2023 ----- Message ----- From: Annamaria Helling Sent: 01/22/2023   2:10 PM EDT To: Sondra Come, MD

## 2023-01-28 ENCOUNTER — Other Ambulatory Visit: Payer: Self-pay

## 2023-01-29 ENCOUNTER — Other Ambulatory Visit: Payer: Self-pay

## 2023-03-22 ENCOUNTER — Telehealth: Payer: Medicaid Other | Admitting: Emergency Medicine

## 2023-03-22 DIAGNOSIS — R300 Dysuria: Secondary | ICD-10-CM

## 2023-03-22 NOTE — Progress Notes (Signed)
Because of your recent visits and testing with urology and recurrent UTIs, I feel your condition warrants further evaluation and I recommend that you be seen for a face to face visit.  Please contact your primary care physician practice or your urologist to be seen.   NOTE: You will NOT be charged for this eVisit.  If you do not have a PCP, [facility] offers a free physician referral service available at [phone]. Our trained staff has the experience, knowledge and resources to put you in touch with a physician who is right for you.    If you are having a true medical emergency please call 911.   Your e-visit answers were reviewed by a board certified advanced clinical practitioner to complete your personal care plan.  Thank you for using e-Visits.

## 2023-03-23 ENCOUNTER — Ambulatory Visit
Admission: RE | Admit: 2023-03-23 | Discharge: 2023-03-23 | Disposition: A | Discharge status: Home / Self Care | Payer: Medicaid Other | Source: Ambulatory Visit | Attending: Emergency Medicine | Admitting: Emergency Medicine

## 2023-03-23 VITALS — BP 131/84 | HR 84 | Temp 97.6°F | Resp 16

## 2023-03-23 DIAGNOSIS — R300 Dysuria: Secondary | ICD-10-CM | POA: Diagnosis present

## 2023-03-23 DIAGNOSIS — R350 Frequency of micturition: Secondary | ICD-10-CM

## 2023-03-23 LAB — POCT URINALYSIS DIP (MANUAL ENTRY)
Glucose, UA: NEGATIVE mg/dL
Nitrite, UA: NEGATIVE
Protein Ur, POC: >=300 mg/dL — AB
Spec Grav, UA: 1.025 (ref 1.010–1.025)
Urobilinogen, UA: 1 U/dL
pH, UA: 7 (ref 5.0–8.0)

## 2023-03-23 MED ORDER — SULFAMETHOXAZOLE-TRIMETHOPRIM 800-160 MG PO TABS: 1.0000 | ORAL_TABLET | Freq: Two times a day (BID) | ORAL | 0 refills | Status: AC

## 2023-03-23 NOTE — ED Provider Notes (Signed)
Renaldo Fiddler    CSN: [id number] Arrival date & time: 03/23/23  0930      History   Chief Complaint Chief Complaint  Patient presents with   Urinary Frequency    Dysuria, urgency. No visible hematuria. - Entered by patient    HPI Lindora Latoyia Tecson is a 52 y.o. female.   Patient presents for evaluation of urinary frequency, urgency, dysuria and left flank pain beginning 2 days ago.  Has not attempted treatment.  History of reoccurring UTI, followed by urology.  Denies presence of fever, abdominal pain or pressure, hematuria, vaginal symptoms.  Past Medical History:  Diagnosis Date   Atrial tachycardia (HCC)    Dysrhythmia    Hx: PSVT. Current: Postural Orthostatic Tachycardia Syndrome, Inappropriate Sinus Node Tachycardia   Headache    migraines, rare.   Kidney stones    Palpitations    [facility] 2/12   Shortness of breath dyspnea    on exertion, secondary to heart issues   Sinus tachycardia    [facility] 2/12   Syncope    secondary to heart issues    Patient Active Problem List   Diagnosis Date Noted   Encounter for gynecological examination without abnormal finding 04/06/2015   Mixed incontinence 04/06/2015   Adhesive capsulitis 02/16/2015   Bursitis of shoulder 12/29/2014   Tendinitis of right shoulder 12/29/2014   Other shoulder lesions, right shoulder 12/29/2014   Episode of syncope 05/11/2014   Atrial flutter (HCC) 09/05/2012   CFIDS (chronic fatigue and immune dysfunction syndrome) (HCC) 09/05/2012   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   Postural orthostatic tachycardia syndrome 08/30/2011   Fast heart beat 08/30/2011   Supraventricular arrhythmia 05/28/2011   UNSPECIFIED MYOCARDITIS 07/20/2010   SINUS TACHYCARDIA 07/18/2010   DYSAUTONOMIA 07/18/2010   PALPITATIONS, RECURRENT 07/18/2010   DYSPNEA ON EXERTION 07/18/2010   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    Past Surgical History:  Procedure Laterality Date   BREAST BIOPSY  Left 05/11/2013   stereo biopsy   CARDIAC ELECTROPHYSIOLOGY STUDY AND ABLATION  04/24/11   [facility]   CLOSED MANIPULATION SHOULDER WITH STERIOD INJECTION Right 02/16/2015   Procedure: CLOSED MANIPULATION SHOULDER WITH STEROID INJECTION;  Surgeon: Christena Flake, MD;  [facility];  Service: Orthopedics;  Laterality: Right;   FINGER SURGERY Right 12/25/2017   explorative surgery on R5th PIP    TONSILLECTOMY      OB History   No obstetric history on file.      Home Medications    Prior to Admission medications   Medication Sig Start Date End Date Taking? Authorizing Provider  sulfamethoxazole-trimethoprim (BACTRIM DS) 800-160 MG tablet Take 1 tablet by mouth 2 (two) times daily for 7 days. 03/23/23 03/30/23 Yes Coralyn Roselli R, NP  acyclovir cream (ZOVIRAX) 5 % Apply 1 Application topically 5 (five) times daily.    [provider]  atorvastatin (LIPITOR) 40 MG tablet Take 1 tablet by mouth daily. 10/02/21   [provider]  Calcium Carb-Cholecalciferol (OYSTER SHELL CALCIUM W/D) 500-5 MG-MCG TABS Take by mouth.    [provider]  Calcium Citrate-Vitamin D (CALCIUM + D PO) Take by mouth.    [provider]  conjugated estrogens (PREMARIN) vaginal cream Discard applicator and Apply pea sized amount to tip of finger to urethra before bed. Wash hands well after application. Use Monday, Wednesday and Friday 01/23/23   Sondra Come, MD  CORLANOR 5 MG TABS tablet Take 5 mg by mouth 2 (two) times daily. 01/07/15  [provider]  COSENTYX SENSOREADY PEN 150 MG/ML SOAJ Inject into the skin every 28 (twenty-eight) days.    [provider]  doxycycline (VIBRAMYCIN) 100 MG capsule Take 1 capsule (100 mg total) by mouth every 12 (twelve) hours. 12/25/22   Sondra Come, MD  escitalopram (LEXAPRO) 5 MG tablet Take 1 tablet by mouth daily. 10/04/21   [provider]  fludrocortisone (FLORINEF) 0.1 MG tablet Take  100 mcg by mouth daily. 01/28/15   [provider]  folic acid (FOLVITE) 1 MG tablet Take 1 tablet by mouth daily. 02/03/19   [provider]  gabapentin (NEURONTIN) 400 MG capsule Take 400 mg by mouth at bedtime. 02/17/19   [provider]  indomethacin (INDOCIN) 50 MG capsule Take 50 mg by mouth 2 (two) times daily with a meal.    [provider]  methotrexate (RHEUMATREX) 2.5 MG tablet Take by mouth. 12/28/19   [provider]  metoprolol succinate (TOPROL-XL) 25 MG 24 hr tablet Take 12.5 mg by mouth daily. 12/02/20   [provider]  Multiple Vitamin (MULTIVITAMIN) capsule Take 1 capsule by mouth daily.    [provider]  SUMAtriptan (IMITREX) 50 MG tablet TAKE 1 TAB AT EARLIEST ONSET OF SYMPTOMS. MAY TAKE A SECOND DOSE AFTER 2 HOURS IF NEEDED. 08/15/18   [provider]  topiramate (TOPAMAX) 50 MG tablet Take 50 mg by mouth 3 (three) times daily. 11/14/22   [provider]  vitamin B-12 (CYANOCOBALAMIN) 1000 MCG tablet Take 1,000 mcg by mouth daily.    [provider]  zolpidem (AMBIEN) 5 MG tablet Take 5-10 mg by mouth at bedtime as needed.      [provider]  levonorgestrel (MIRENA) 20 MCG/24HR IUD 1 each by Intrauterine route once.    01/30/20  [provider]  propranolol (INDERAL) 20 MG tablet TAKE 1 TABLET (20 MG TOTAL) BY MOUTH 4 TIMES DAILY. 12/03/14 01/30/20  [provider]    Family History Family History  Problem Relation Age of Onset   Heart attack Maternal Grandmother 50       deceased   Hypertension Father        and high cholesterol    Hypertension Mother        and high cholesterol    Breast cancer Neg Hx     Social History Social History   Tobacco Use   Smoking status: Never   Smokeless tobacco: Never   Tobacco comments:    tobacco use - no   Vaping Use   Vaping status: Never Used  Substance Use Topics   Alcohol use: No    Comment: rare   Drug use:  No     Allergies   Erythromycin and Corticosteroids   Review of Systems Review of Systems   Physical Exam Triage Vital Signs ED Triage Vitals  Encounter Vitals Group     BP 03/23/23 0940 131/84     Systolic BP Percentile --      Diastolic BP Percentile --      Pulse Rate 03/23/23 0940 84     Resp 03/23/23 0940 16     Temp 03/23/23 0940 97.6 F (36.4 C)     Temp Source 03/23/23 0940 Temporal     SpO2 03/23/23 0940 98 %     Weight --      Height --      Head Circumference --      Peak Flow --  Pain Score 03/23/23 0955 0     Pain Loc --      Pain Education --      Exclude from Growth Chart --    No data found.  Updated Vital Signs BP 131/84 (BP Location: Left Arm)   Pulse 84   Temp 97.6 F (36.4 C) (Temporal)   Resp 16   LMP 10/04/2022   SpO2 98%   Visual Acuity Right Eye Distance:   Left Eye Distance:   Bilateral Distance:    Right Eye Near:   Left Eye Near:    Bilateral Near:     Physical Exam Constitutional:      Appearance: Normal appearance.  Eyes:     Extraocular Movements: Extraocular movements intact.  Pulmonary:     Effort: Pulmonary effort is normal.  Abdominal:     General: Abdomen is flat. Bowel sounds are normal.     Palpations: Abdomen is soft.     Tenderness: There is no abdominal tenderness. There is no right CVA tenderness, left CVA tenderness or guarding.  Neurological:     Mental Status: She is alert and oriented to person, place, and time. Mental status is at baseline.      UC Treatments / Results  Labs (all labs ordered are listed, but only abnormal results are displayed) Labs Reviewed  POCT URINALYSIS DIP (MANUAL ENTRY) - Abnormal; Notable for the following components:      Result Value   Clarity, UA cloudy (*)    Bilirubin, UA small (*)    Ketones, POC UA trace (5) (*)    Blood, UA moderate (*)    Protein Ur, POC >=300 (*)    Leukocytes, UA Large (3+) (*)    All other components within normal limits  URINE  CULTURE    EKG   Radiology No results found.  Procedures Procedures (including critical care time)  Medications Ordered in UC Medications - No data to display  Initial Impression / Assessment and Plan / UC Course  I have reviewed the triage vital signs and the nursing notes.  Pertinent labs & imaging results that were available during my care of the patient were reviewed by me and considered in my medical decision making (see chart for details).  Urinary frequency, dysuria  Vitals are stable, no signs of sepsis, no abdominal or CVA tenderness noted on exam, stable for outpatient management, urinalysis showing leukocytes and hematuria negative for nitrates, due to history and as she is symptomatic we will prophylactically be placed on antibiotics, antibiotic choice based on last available culture which shows Klebsiella, susceptible to Septra, has had success with this medicine in the past, sent to pharmacy, recommended additional supportive measures, may follow-up with urgent care as needed Final Clinical Impressions(s) / UC Diagnoses   Final diagnoses:  Dysuria  Urinary frequency     Discharge Instructions      Your urinalysis shows Kenneth Lax blood cells but at this time does not show bacteria your urine will be sent to the lab to determine exactly which bacteria is present, if any changes need to be made to your medications you will be notified  Begin use of  Septra twice a day for 7 days  You may use over-the-counter Azo to help minimize your symptoms until antibiotic removes bacteria, this medication will turn your urine orange  Increase your fluid intake through use of water  As always practice good hygiene, wiping front to back and avoidance of scented vaginal products to prevent  further irritation  If symptoms continue to persist after use of medication or recur please follow-up with urgent care or your primary doctor as needed    ED Prescriptions     Medication Sig  Dispense Auth. Provider   sulfamethoxazole-trimethoprim (BACTRIM DS) 800-160 MG tablet Take 1 tablet by mouth 2 (two) times daily for 7 days. 14 tablet Finnian Husted, Elita Boone, NP      PDMP not reviewed this encounter.   Valinda Hoar, NP 03/23/23 1018

## 2023-03-23 NOTE — Discharge Instructions (Addendum)
Your urinalysis shows Rayetta Veith blood cells but at this time does not show bacteria your urine will be sent to the lab to determine exactly which bacteria is present, if any changes need to be made to your medications you will be notified  Begin use of  Septra twice a day for 7 days  You may use over-the-counter Azo to help minimize your symptoms until antibiotic removes bacteria, this medication will turn your urine orange  Increase your fluid intake through use of water  As always practice good hygiene, wiping front to back and avoidance of scented vaginal products to prevent further irritation  If symptoms continue to persist after use of medication or recur please follow-up with urgent care or your primary doctor as needed

## 2023-03-23 NOTE — ED Triage Notes (Signed)
Patient presents to UC for dysuria, urinary freq, urinary urgency x 2 days. States she is always in discomfort due to urethra issues. Had e-visit and was instructed to come in for eval. No OTC meds.   Denies hematuria, fever.

## 2023-03-25 LAB — URINE CULTURE: Culture: 50000 — AB

## 2023-04-23 ENCOUNTER — Encounter: Payer: Self-pay | Admitting: Urology

## 2023-04-23 ENCOUNTER — Other Ambulatory Visit
Admission: RE | Admit: 2023-04-23 | Discharge: 2023-04-23 | Disposition: A | Discharge status: Home / Self Care | Payer: Medicaid Other | Attending: Urology | Admitting: Urology

## 2023-04-23 ENCOUNTER — Ambulatory Visit: Payer: Medicaid Other | Admitting: Urology

## 2023-04-23 VITALS — BP 130/84 | HR 118 | Ht 66.0 in | Wt 122.0 lb

## 2023-04-23 DIAGNOSIS — R350 Frequency of micturition: Secondary | ICD-10-CM | POA: Diagnosis not present

## 2023-04-23 DIAGNOSIS — R102 Pelvic and perineal pain: Secondary | ICD-10-CM

## 2023-04-23 DIAGNOSIS — R300 Dysuria: Secondary | ICD-10-CM | POA: Diagnosis not present

## 2023-04-23 DIAGNOSIS — N390 Urinary tract infection, site not specified: Secondary | ICD-10-CM | POA: Diagnosis present

## 2023-04-23 DIAGNOSIS — R3915 Urgency of urination: Secondary | ICD-10-CM | POA: Diagnosis not present

## 2023-04-23 DIAGNOSIS — R82998 Other abnormal findings in urine: Secondary | ICD-10-CM

## 2023-04-23 LAB — URINALYSIS, COMPLETE (UACMP) WITH MICROSCOPIC
Bilirubin Urine: NEGATIVE
Glucose, UA: NEGATIVE mg/dL
Ketones, ur: NEGATIVE mg/dL
Nitrite: NEGATIVE
Protein, ur: 100 mg/dL — AB
Specific Gravity, Urine: 1.025 (ref 1.005–1.030)
WBC, UA: >50 WBC/hpf (ref 0–5)
pH: 6.5 (ref 5.0–8.0)

## 2023-04-23 MED ORDER — TRIMETHOPRIM 100 MG PO TABS: 100.0000 mg | ORAL_TABLET | Freq: Every day | ORAL | 0 refills | Status: DC

## 2023-04-23 MED ORDER — CEPHALEXIN 500 MG PO CAPS: 500.0000 mg | ORAL_CAPSULE | Freq: Two times a day (BID) | ORAL | 0 refills | Status: DC

## 2023-04-23 NOTE — Progress Notes (Signed)
   04/23/2023 3:05 PM   Arzu Efraim Kaufmann Simonin 16-Sep-1970 [id number]  Reason for visit: Follow up UTI, urinary symptoms, pelvic pain  HPI: 52 year old female who I originally met in July 2024 for 6 weeks of acute onset of urinary symptoms with intermittent gross hematuria, dysuria, urgency/frequency, and pelvic pain.  She had multiple urine cultures positive for Klebsiella, but was unsure if she was having significant improvement after taking antibiotics.  She was not sexually active and deferred STD testing.  She also was having some irregular periods and thinks she may be going through menopause.  With the significant gross hematuria I also recommended a CT and cystoscopy.  CT urogram from August 2024 was benign aside from constipation and nonobstructive left renal stones with no hydronephrosis.  Cystoscopy in August 2024 showed some mild to moderate bladder erythema, but cytology was negative, and erythema was felt to be related to recent infection.  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  She continues to have symptoms, including pelvic pain, dysuria, urgency/frequency.  She has had multiple positive urine cultures, including multiple in October for Klebsiella that have had a similar resistance pattern.  She has been treated with culture appropriate antibiotics, however is unsure that these have made a significant improvement in her symptoms.  Very challenging to determine if her symptoms are related to recurrent/persistent infection, or alternative etiology like pelvic floor dysfunction or interstitial cystitis.  She does have chronic extremity pain related to arthritis and takes NSAIDs daily long-term as well as gabapentin.  We discussed the complexities of pelvic pain and possible range of etiologies including pelvic floor dysfunction, chronic bladder pain syndrome, and interstitial cystitis.  We reviewed the AUA guidelines that recommend an algorithmic approach to treatment  for these patients, and that a trial of different medications and strategies is sometimes needed to find the approach that works best for each patient's unique situation.  I reinforced the importance of stress management, relaxation, avoiding triggers, and pain management in the approach to pelvic pain.   Overall, very challenging to differentiate if her symptoms are related to persistent/recurrent infection or IC/chronic pelvic pain/pelvic floor dysfunction.  Urinalysis appears grossly infected today, I recommended a 10-day course of Keflex followed by daily low-dose trimethoprim prophylaxis.  If symptoms improve with this strategy, may need to consider left ureteroscopy as multiple stones may be a nidus for her recurrent infections.  If no improvement, may need to pursue more of a pelvic floor dysfunction/IC picture, could also trial amitriptyline  RTC 1 month symptom check  Sondra Come, MD  [facility] [address] [place], [place] [address] [phone]

## 2023-04-23 NOTE — Patient Instructions (Signed)
Interstitial Cystitis  Interstitial cystitis (IC) is inflammation of the bladder. It's also called painful bladder syndrome. It can cause pain near your bladder. It can also make you have to pee urgently and often. IC may flare up and then go away for a while. In some cases, it may become a long-term (chronic) problem. What are the causes? The cause of IC isn't known. What increases the risk? You may be more likely to get IC if: You're female. You have certain other conditions. These include: Fibromyalgia. Irritable bowel syndrome (IBS). Endometriosis. Chronic fatigue syndrome. You may have worse symptoms if: You're under a lot of stress. You smoke. You have certain foods or drinks. What are the signs or symptoms? Your symptoms may change over time. They may include: Discomfort or pain near your bladder. The pain may range from mild to very bad. You may have more or less pain as your bladder fills with pee and empties. Pain in your pelvic area. This is the area between your hip bones. Needing to pee often or all the time. Pain when you pee. Pain during sex. Blood in your pee. Tiredness. If you're female, your symptoms may get worse when you have your menstrual period. How is this diagnosed? IC is diagnosed based on your symptoms, your medical history, and an exam. Your health care provider may need to rule out other conditions. They may do tests, such as: Pee tests. A cystoscopy. This test looks at the inside of your bladder. Biopsy. This is when a small piece of tissue is removed from your bladder for testing. How is this treated? There's no cure for IC. But treatment can help you manage your symptoms. Work with your provider to find the best treatments for you. These may include: Medicines to help with pain or to reduce how often you feel the need to pee. These may be given by mouth or put in your bladder using a soft tube called a catheter. Diet changes. Taking steps to  manage stress. Physical therapy. This may include: Exercises. These can help you relax your pelvic floor muscles. Massage. This may be done to relax tight muscles. Bladder training. This is when you learn ways to control when you pee. Neuromodulation therapy. This uses a device that's put on your back. It blocks the nerves that cause you to feel pain near your bladder. A procedure to stretch your bladder. This may be done by filling your bladder with air or fluid. Surgery. This is rare. It's only done if other treatments don't help. Follow these instructions at home: Eating and drinking Make changes to your diet as told by your provider. You may need to avoid: Spicy foods. Foods with acid in them, like tomatoes or citrus. Foods with a lot of potassium in them. Avoid drinking alcohol and caffeine. These drinks can make you have to pee more. Lifestyle Learn and practice ways to relax. These may include deep breathing and muscle relaxation. Get care for your mental well-being. You may need to: Do cognitive behavioral therapy (CBT). This therapy can change the way you think or act in response to things. It may help you feel better. See a therapist if you're depressed. Work with your provider on other ways to manage pain. Acupuncture may help. Do not use any products that contain nicotine or tobacco. These products include cigarettes, chewing tobacco, and vaping devices, such as e-cigarettes. If you need help quitting, ask your provider. Bladder training  Do bladder training as told. You  may need to: Pee at set, regular times. Train yourself to delay peeing. Keep a bladder diary. Write down: The times you pee. Any symptoms you have. The diary can help you find out what makes your symptoms worse. Use the diary to schedule times to pee. If you're away from home, plan to be near a bathroom at those times. Make sure you pee: Just before you leave the house. Just before you go to  bed. General instructions Take over-the-counter and prescription medicines only as told by your provider. Try putting a warm or cool cloth called a compress over your bladder. This can help with pain. Avoid wearing tight clothes. Do exercises as told by your provider. Where to find more information [employer]: urologyhealth.org Interstitial Cystitis Association (ICA): [employer] Contact a health care provider if: Your symptoms don't get better with treatment. Your pain or discomfort gets worse. You have to pee more often. You have little to no control over when you pee. You have a fever or chills. This information is not intended to replace advice given to you by your health care provider. Make sure you discuss any questions you have with your health care provider. Document Revised: 09/01/2022 Document Reviewed: 09/01/2022 Elsevier Patient Education  2024 Elsevier Inc.  Eating Plan for Interstitial Cystitis Interstitial cystitis (IC) is a long-term (chronic) condition that can cause pain and pressure near your bladder. It can also make you have to pee urgently and often. Symptoms may come and go. Certain foods may trigger your symptoms. Learning what foods bother you can help you come up with an eating plan to manage IC. What are tips for following this plan? You may want to work with an expert in healthy eating called a dietitian. They can help you make an eating plan by doing an elimination diet. This diet involves: Making a list of foods that you think trigger your symptoms. You may also want to include foods that often cause symptoms in other people with IC. It may take a few months to find out which foods bother you. Taking those foods out of your diet for about a month. After that month, you can try to have the foods again one at a time to see which ones cause symptoms. Reading food labels Once you know which foods trigger your symptoms, you can avoid them. But it's  still a good idea to read food labels. Some foods that cause your symptoms may be ingredients in other foods. These foods may include: Soy. Worcestershire sauce. Vinegar. Alcohol. Artificial sweeteners. Monosodium glutamate. Other triggers may include: Chili peppers. Tomato products. Citrus fruits, flavors, or juices. Shopping Shopping can be hard if many foods trigger your IC. Bring a list of the foods you can't eat with you when you go to the store. You can get an app for your phone that lets you know which foods are safer and which ones you may want to avoid. You can find the app at the [employer] website: ic-network.com Meal planning Plan your meals based on the results of your elimination diet. If you haven't done the diet yet, plan meals based on IC food lists. These lists may be given to you by your health care provider or dietitian. The lists tell you which foods are least and most likely to cause symptoms. Avoid certain types of food when you go out to eat. These may include: Pizza. [place] food. [place] food. [place] food. General information Do not eat large portions. Drink lots  of fluids with your meals. Do not eat foods that are high in sugar, salt, or saturated fat. Choose whole fruits instead of juice. Eat a colorful variety of vegetables. Find ways to manage stress. Get enough exercise. What foods should I eat? For people with IC, the best diet is a balanced one. This means it includes things from all the food groups. Even if you have to avoid certain foods, there are still lots of healthy choices in each group. Below are some foods that may be safest for you to eat: Fruits Bananas. Blueberries and blueberry juice. Melons. Pears. Apples. Dates. Prunes. Raisins. Apricots. Vegetables Asparagus. Avocado. Celery. Beets. Bell peppers. Black olives. Broccoli. Brussels sprouts. Cabbage. Carrots. Cauliflower. Cucumber. Eggplant. Green beans. Potatoes. Radishes. Spinach. Squash.  Turnips. Zucchini. Mushrooms. Peas. Grains Oats. Rice. Bran. Oatmeal. Whole wheat bread. Meats and other proteins Beef. Fish and other seafood. Eggs. Nuts. Peanut butter. Pork. Poultry. Lamb. Garbanzo beans. Pinto beans. Dairy Whole or low-fat milk. American, mozzarella, mild cheddar, feta, ricotta, and cream cheeses. The items listed above may not be all the foods and drinks you can have. Talk to a dietitian to learn more. What foods should I avoid? You should avoid any foods that cause symptoms. It's also a good idea to avoid foods that often cause symptoms in many people with IC. These foods include: Fruits Citrus fruits, such as lemons, limes, oranges, and grapefruit. Cranberries. Strawberries. Pineapple. Kiwi. Vegetables Chili peppers. Onions. Sauerkraut. Tomato and tomato products. Rosita Fire. Grains You don't need to avoid any type of grain unless it causes symptoms. Meats and other proteins Precooked or cured meats, such as sausages or meat loaves. Soy products. Dairy Chocolate ice cream. Processed cheese. Yogurt. Drinks Alcohol. Chocolate drinks. Coffee. Cranberry juice. Fizzy drinks. Black, green, or herbal tea. Tomato juice. Sports drinks. The items listed above may not be all the foods and drinks you should avoid. Talk to a dietitian to learn more. This information is not intended to replace advice given to you by your health care provider. Make sure you discuss any questions you have with your health care provider. Document Revised: 09/06/2022 Document Reviewed: 09/06/2022 Elsevier Patient Education  2024 [employer].

## 2023-04-25 LAB — URINE CULTURE: Culture: NO GROWTH

## 2023-05-22 NOTE — Progress Notes (Unsigned)
05/27/2023 1:17 PM   Lisa Clay Dec 27, 1970 [id number]  Referring provider: Dorothey Baseman, MD [phone] [address] [place],  [place] [address]  Urological history: 1.  High risk hematuria -non - smoker -CTU (01/2023) - multiple small bilateral renal calculi -cysto (01/2023) -mild to moderate bladder erythema -urine cytology (01/2023) - negative   2. rUTI's  -Contributing factors of age, GSM, incontinence, nephrolithiasis and immune suppressants -Documented urine cultures over the last year  April 23, 2023  - No growth  April 09, 2023 - Klebsiella pneumoniae  March 23, 2023 - Klebsiella pneumoniae  December 18, 2022 - No growth  December 04, 2022 - Klebsiella pneumoniae  November 21, 2022 - Klebsiella pneumoniae -Trimethoprim 100 mg daily  No chief complaint on file.  HPI: Lisa Clay is a 52 y.o. female who presents today for one month follow up.  Previous records reviewed.   She is placed on trimethoprim 100 mg daily by Dr. Richardo Hanks in order to hopefully further delineate her symptoms to see if they are caused by IC/chronic pelvic pain syndrome or recurrent UTIs.  PMH: Past Medical History:  Diagnosis Date   Atrial tachycardia (HCC)    Dysrhythmia    Hx: PSVT. Current: Postural Orthostatic Tachycardia Syndrome, Inappropriate Sinus Node Tachycardia   Headache    migraines, rare.   Kidney stones    Palpitations    [facility] 2/12   Shortness of breath dyspnea    on exertion, secondary to heart issues   Sinus tachycardia    [facility] 2/12   Syncope    secondary to heart issues    Surgical History: Past Surgical History:  Procedure Laterality Date   BREAST BIOPSY Left 05/11/2013   stereo biopsy   CARDIAC ELECTROPHYSIOLOGY STUDY AND ABLATION  04/24/11   [facility]   CLOSED MANIPULATION SHOULDER WITH STERIOD INJECTION Right 02/16/2015   Procedure: CLOSED MANIPULATION SHOULDER WITH STEROID INJECTION;  Surgeon: Christena Flake, MD;  [facility]: [facility];  Service: Orthopedics;  Laterality: Right;   FINGER SURGERY Right 12/25/2017   explorative surgery on R5th PIP    TONSILLECTOMY      Home Medications:  Allergies as of 05/27/2023       Reactions   Erythromycin Nausea And Vomiting   Other Reaction(s): GI Intolerance, Other (See Comments)   Corticosteroids Hives, Rash        Medication List        Accurate as of May 22, 2023  1:17 PM. If you have any questions, ask your nurse or doctor.          Ambien 5 MG tablet Generic drug: zolpidem Take 5-10 mg by mouth at bedtime as needed.   atorvastatin 40 MG tablet Commonly known as: LIPITOR Take 1 tablet by mouth daily.   CALCIUM + D PO Take by mouth.   cephALEXin 500 MG capsule Commonly known as: Keflex Take 1 capsule (500 mg total) by mouth 2 (two) times daily.   Corlanor 5 MG Tabs tablet Generic drug: ivabradine Take 5 mg by mouth 2 (two) times daily.   Cosentyx Sensoready Pen 150 MG/ML Soaj Generic drug: Secukinumab Inject into the skin every 28 (twenty-eight) days.   cyanocobalamin 1000 MCG tablet Commonly known as: VITAMIN B12 Take 1,000 mcg by mouth daily.   escitalopram 5 MG tablet Commonly known as: LEXAPRO Take 1 tablet by mouth daily.   fludrocortisone 0.1 MG tablet Commonly known as: FLORINEF Take 100 mcg by mouth daily.   folic acid  1 MG tablet Commonly known as: FOLVITE Take 1 tablet by mouth daily.   gabapentin 400 MG capsule Commonly known as: NEURONTIN Take 400 mg by mouth at bedtime.   indomethacin 50 MG capsule Commonly known as: INDOCIN Take 50 mg by mouth 2 (two) times daily with a meal.   metoprolol succinate 25 MG 24 hr tablet Commonly known as: TOPROL-XL Take 12.5 mg by mouth daily.   multivitamin capsule Take 1 capsule by mouth daily.   Oyster Shell Calcium w/D 500-5 MG-MCG Tabs Take by mouth.   Premarin vaginal cream Generic drug: conjugated estrogens Discard applicator and Apply pea sized amount  to tip of finger to urethra before bed. Wash hands well after application. Use Monday, Wednesday and Friday   SUMAtriptan 50 MG tablet Commonly known as: IMITREX TAKE 1 TAB AT EARLIEST ONSET OF SYMPTOMS. MAY TAKE A SECOND DOSE AFTER 2 HOURS IF NEEDED.   topiramate 50 MG tablet Commonly known as: TOPAMAX Take 50 mg by mouth 3 (three) times daily.   trimethoprim 100 MG tablet Commonly known as: TRIMPEX Take 1 tablet (100 mg total) by mouth daily.        Allergies:  Allergies  Allergen Reactions   Erythromycin Nausea And Vomiting    Other Reaction(s): GI Intolerance, Other (See Comments)   Corticosteroids Hives and Rash    Family History: Family History  Problem Relation Age of Onset   Heart attack Maternal Grandmother 50       deceased   Hypertension Father        and high cholesterol    Hypertension Mother        and high cholesterol    Breast cancer Neg Hx     Social History:  reports that she has never smoked. She has never used smokeless tobacco. She reports that she does not drink alcohol and does not use drugs.  ROS: Pertinent ROS in HPI  Physical Exam: There were no vitals taken for this visit.  Constitutional:  Well nourished. Alert and oriented, No acute distress. HEENT: [place] AT, moist mucus membranes.  Trachea midline, no masses. Cardiovascular: No clubbing, cyanosis, or edema. Respiratory: Normal respiratory effort, no increased work of breathing. GU: No CVA tenderness.  No bladder fullness or masses.  Recession of labia minora, dry, pale vulvar vaginal mucosa and loss of mucosal ridges and folds.  Normal urethral meatus, no lesions, no prolapse, no discharge.   No urethral masses, tenderness and/or tenderness. No bladder fullness, tenderness or masses. *** vagina mucosa, *** estrogen effect, no discharge, no lesions, *** pelvic support, *** cystocele and *** rectocele noted.  No cervical motion tenderness.  Uterus is freely mobile and non-fixed.  No  adnexal/parametria masses or tenderness noted.  Anus and perineum are without rashes or lesions.   ***  Neurologic: Grossly intact, no focal deficits, moving all 4 extremities. Psychiatric: Normal mood and affect.    Laboratory Data: N/A  Pertinent Imaging: N/A  Assessment & Plan:  ***  1. High risk hematuria -non - smoker -CTU (01/2023) - bilateral nephrolithiasis -cysto (01/2023) - moderate erythema -urine cytology (01/2023) - negative -no report of gross heme  2. Pelvic pain -***  3. rUTI's -***  4. Bilateral nephrolithiasis -We discussed various treatment options for urolithiasis including observation with or without medical expulsive therapy, shockwave lithotripsy (SWL), ureteroscopy and laser lithotripsy with stent placement. -We discussed that management is based on stone size, location, density, patient co-morbidities, and patient preference.   -Stones <77mm in size  have a >80% spontaneous passage rate. Data surrounding the use of tamsulosin for medical expulsive therapy is controversial, but meta analyses suggests it is most efficacious for distal stones between 5-50mm in size. Possible side effects include dizziness/lightheadedness  -SWL has a lower stone free rate in a single procedure, but also a lower complication rate compared to ureteroscopy and avoids a stent and associated stent related symptoms. Possible complications include renal hematoma, steinstrasse, and need for additional treatment.  -Ureteroscopy with laser lithotripsy and stent placement has a higher stone free rate than SWL in a single procedure, however increased complication rate including possible infection, ureteral injury, bleeding, and stent related morbidity. Common stent related symptoms include dysuria, urgency/frequency, and flank pain. -Left/Right ureteral stone(s) renal stone(s)***  - schedule *** ureteroscopy with laser lithotripsy and ureteral stent placement  - explained to the patient how  the procedure is performed and the risks involved  -informed the patient that they will have an ureteral stent, which will remain in place for approximately 3-10 days and can be associated with flank pain, bladder pain, dysuria, urgency, frequency, urinary leakage, and gross hematuria.  - stent may be removed in the office with a cystoscope or patient may be instructed to remove the stent themselves by the string and that is decided on the day of the procedure  - residual stones within the kidney or ureter may be present after the procedure and may need to have these addressed at a different encounter  - injury to the ureter is the most common intra-operative risk, it may result in an open procedure to correct the defect  - infection and bleeding are also risks  - explained the risks of general anesthesia, such as: MI, CVA, paralysis, coma and/or death.  - advised to contact our office or seek treatment in the ED if becomes febrile or pain/ vomiting are difficult control in order to arrange for emergent/urgent intervention   No follow-ups on file.  These notes generated with voice recognition software. I apologize for typographical errors.  Cloretta Ned  [facility] [address]  [address] [place], [place] [address] [phone]

## 2023-05-27 ENCOUNTER — Ambulatory Visit: Payer: Medicaid Other | Admitting: Urology

## 2023-05-27 DIAGNOSIS — R102 Pelvic and perineal pain: Secondary | ICD-10-CM

## 2023-05-27 DIAGNOSIS — R319 Hematuria, unspecified: Secondary | ICD-10-CM

## 2023-05-27 DIAGNOSIS — N200 Calculus of kidney: Secondary | ICD-10-CM

## 2023-05-27 DIAGNOSIS — N390 Urinary tract infection, site not specified: Secondary | ICD-10-CM

## 2023-06-19 ENCOUNTER — Ambulatory Visit: Payer: Medicaid Other | Admitting: Physician Assistant

## 2023-06-21 ENCOUNTER — Encounter: Payer: Self-pay | Admitting: Physician Assistant

## 2023-08-14 ENCOUNTER — Other Ambulatory Visit: Payer: Self-pay | Admitting: Urology

## 2023-08-20 ENCOUNTER — Other Ambulatory Visit: Payer: Self-pay | Admitting: Urology

## 2023-10-09 ENCOUNTER — Ambulatory Visit
Admission: RE | Admit: 2023-10-09 | Discharge: 2023-10-09 | Disposition: A | Discharge status: Home / Self Care | Source: Ambulatory Visit | Attending: Emergency Medicine | Admitting: Emergency Medicine

## 2023-10-09 VITALS — BP 116/84 | HR 91 | Temp 97.2°F | Resp 18

## 2023-10-09 DIAGNOSIS — R350 Frequency of micturition: Secondary | ICD-10-CM | POA: Insufficient documentation

## 2023-10-09 LAB — POCT URINALYSIS DIP (MANUAL ENTRY)
Bilirubin, UA: NEGATIVE
Glucose, UA: NEGATIVE mg/dL
Ketones, POC UA: NEGATIVE mg/dL
Nitrite, UA: NEGATIVE
Protein Ur, POC: >=300 mg/dL — AB
Spec Grav, UA: 1.025
Urobilinogen, UA: 0.2 U/dL
pH, UA: 6.5

## 2023-10-09 MED ORDER — CIPROFLOXACIN HCL 500 MG PO TABS: 500.0000 mg | ORAL_TABLET | Freq: Two times a day (BID) | ORAL | 0 refills | Status: AC

## 2023-10-09 NOTE — Discharge Instructions (Addendum)
 Your urinalysis at this time does not show bacteria, your urine will be sent to the lab to determine exactly which bacteria is present, if any changes need to be made to your medications you will be notified  Begin use of ciprofloxacin twice daily for 5 days this antibiotic was chosen off of your last urine culture which showed Klebsiella pneumoniae  You may use over-the-counter Azo to help minimize your symptoms until antibiotic removes bacteria, this medication will turn your urine orange  Increase your fluid intake through use of water  As always practice good hygiene, wiping front to back and avoidance of scented vaginal products to prevent further irritation  If symptoms continue to persist after use of medication or recur please follow-up with urgent care or your primary doctor as needed

## 2023-10-09 NOTE — ED Triage Notes (Signed)
 Patient to Urgent Care with complaints of urinary frequency/ urgency/ dysuria.   Reports completing 10 day course of keflex  on Thursday 4/24 and is still having symptoms.   Taking Methenamine hippurate daily as preventative.

## 2023-10-09 NOTE — ED Provider Notes (Signed)
 Lisa Clay    CSN: [id number] Arrival date & time: 10/09/23  4098      History   Chief Complaint Chief Complaint  Patient presents with   Urinary Frequency    Pain, urgency, frequency- completed 10 day of keflex  last Thursday. S/s haven't resolved. - Entered by patient    HPI Lisa Clay is a 53 y.o. female.   Patient presents for evaluation of urinary frequency, dysuria and urgency present for an unknown timeframe.  Experiencing mild left-sided low back pain intermittently.  Completed a 10-day course of antibiotic on 10/03/2023.  Followed by urogynecology for chronic UTIs, taking daily medication for preventative care.  Additionally has attempted use of Azo.  Denies abdominal pain, fever, vaginal symptoms, hematuria.  Past Medical History:  Diagnosis Date   Atrial tachycardia (HCC)    Dysrhythmia    Hx: PSVT. Current: Postural Orthostatic Tachycardia Syndrome, Inappropriate Sinus Node Tachycardia   Headache    migraines, rare.   Kidney stones    Palpitations    [facility] 2/12   Shortness of breath dyspnea    on exertion, secondary to heart issues   Sinus tachycardia    [facility] 2/12   Syncope    secondary to heart issues    Patient Active Problem List   Diagnosis Date Noted   Encounter for gynecological examination without abnormal finding 04/06/2015   Mixed incontinence 04/06/2015   Adhesive capsulitis 02/16/2015   Bursitis of shoulder 12/29/2014   Tendinitis of right shoulder 12/29/2014   Other shoulder lesions, right shoulder 12/29/2014   Episode of syncope 05/11/2014   Atrial flutter (HCC) 09/05/2012   CFIDS (chronic fatigue and immune dysfunction syndrome) (HCC) 09/05/2012   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   Postural orthostatic tachycardia syndrome 08/30/2011   Fast heart beat 08/30/2011   Supraventricular arrhythmia 05/28/2011   UNSPECIFIED MYOCARDITIS 07/20/2010   SINUS TACHYCARDIA 07/18/2010   DYSAUTONOMIA 07/18/2010    PALPITATIONS, RECURRENT 07/18/2010   DYSPNEA ON EXERTION 07/18/2010   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    Past Surgical History:  Procedure Laterality Date   BREAST BIOPSY Left 05/11/2013   stereo biopsy   CARDIAC ELECTROPHYSIOLOGY STUDY AND ABLATION  04/24/11   [facility]   CLOSED MANIPULATION SHOULDER WITH STERIOD INJECTION Right 02/16/2015   Procedure: CLOSED MANIPULATION SHOULDER WITH STEROID INJECTION;  Surgeon: Elner Hahn, MD;  Location: [facility];  Service: Orthopedics;  Laterality: Right;   FINGER SURGERY Right 12/25/2017   explorative surgery on R5th PIP    TONSILLECTOMY      OB History   No obstetric history on file.      Home Medications    Prior to Admission medications   Medication Sig Start Date End Date Taking? Authorizing Provider  ciprofloxacin (CIPRO) 500 MG tablet Take 1 tablet (500 mg total) by mouth 2 (two) times daily for 5 days. 10/09/23 10/14/23 Yes Sherrine Salberg R, NP  diazepam (VALIUM) 5 MG tablet Insert one tablet (5mg ) into vagina as needed for pelvic pain every 12 hours. 09/23/23  Yes [provider]  methenamine (HIPREX) 1 g tablet Take 1 g by mouth. 05/24/23 05/18/24 Yes [provider]  methotrexate (RHEUMATREX) 2.5 MG tablet Take 15 mg by mouth. 08/27/23  Yes [provider]  nortriptyline (PAMELOR) 10 MG capsule Take 10 mg by mouth. 10/01/23 12/30/23 Yes [provider]  atorvastatin (LIPITOR) 40 MG tablet Take 1 tablet by mouth daily. 10/02/21   [provider]  Calcium Carb-Cholecalciferol (OYSTER SHELL  CALCIUM W/D) 500-5 MG-MCG TABS Take by mouth.    [provider]  Calcium Citrate-Vitamin D (CALCIUM + D PO) Take by mouth.    [provider]  cephALEXin  (KEFLEX ) 500 MG capsule Take 1 capsule (500 mg total) by mouth 2 (two) times daily. Patient not taking: Reported on 10/09/2023 04/23/23   Lawerence Pressman, MD  conjugated estrogens  (PREMARIN ) vaginal cream  Discard applicator and Apply pea sized amount to tip of finger to urethra before bed. Wash hands well after application. Use Monday, Wednesday and Friday 01/23/23   Lawerence Pressman, MD  CORLANOR 5 MG TABS tablet Take 5 mg by mouth 2 (two) times daily. 01/07/15   [provider]  COSENTYX SENSOREADY PEN 150 MG/ML SOAJ Inject into the skin every 28 (twenty-eight) days.    [provider]  escitalopram (LEXAPRO) 5 MG tablet Take 1 tablet by mouth daily. 10/04/21   [provider]  fludrocortisone (FLORINEF) 0.1 MG tablet Take 100 mcg by mouth daily. 01/28/15   [provider]  folic acid (FOLVITE) 1 MG tablet Take 1 tablet by mouth daily. 02/03/19   [provider]  gabapentin (NEURONTIN) 400 MG capsule Take 400 mg by mouth at bedtime. 02/17/19   [provider]  indomethacin (INDOCIN) 50 MG capsule Take 50 mg by mouth 2 (two) times daily with a meal.    [provider]  metoprolol  succinate (TOPROL -XL) 25 MG 24 hr tablet Take 12.5 mg by mouth daily. 12/02/20   [provider]  Multiple Vitamin (MULTIVITAMIN) capsule Take 1 capsule by mouth daily.    [provider]  SUMAtriptan (IMITREX) 50 MG tablet TAKE 1 TAB AT EARLIEST ONSET OF SYMPTOMS. MAY TAKE A SECOND DOSE AFTER 2 HOURS IF NEEDED. 08/15/18   [provider]  topiramate (TOPAMAX) 50 MG tablet Take 50 mg by mouth 3 (three) times daily. 11/14/22   [provider]  trimethoprim  (TRIMPEX ) 100 MG tablet Take 1 tablet (100 mg total) by mouth daily. Patient not taking: Reported on 10/09/2023 04/23/23   Lawerence Pressman, MD  vitamin B-12 (CYANOCOBALAMIN) 1000 MCG tablet Take 1,000 mcg by mouth daily.    [provider]  zolpidem (AMBIEN) 5 MG tablet Take 5-10 mg by mouth at bedtime as needed.   Patient not taking: Reported on 10/09/2023    [provider]  levonorgestrel (MIRENA) 20 MCG/24HR IUD 1 each by Intrauterine route once.    01/30/20   [provider]  propranolol (INDERAL) 20 MG tablet TAKE 1 TABLET (20 MG TOTAL) BY MOUTH 4 TIMES DAILY. 12/03/14 01/30/20  [provider]    Family History Family History  Problem Relation Age of Onset   Heart attack Maternal Grandmother 50       deceased   Hypertension Father        and high cholesterol    Hypertension Mother        and high cholesterol    Breast cancer Neg Hx     Social History Social History   Tobacco Use   Smoking status: Never   Smokeless tobacco: Never   Tobacco comments:    tobacco use - no   Vaping Use   Vaping status: Never Used  Substance Use Topics   Alcohol use: No    Comment: rare   Drug use: No     Allergies   Erythromycin and Corticosteroids   Review of Systems Review of Systems  Genitourinary:  Positive for frequency.  Physical Exam Triage Vital Signs ED Triage Vitals  Encounter Vitals Group     BP 10/09/23 0920 116/84     Systolic BP Percentile --      Diastolic BP Percentile --      Pulse Rate 10/09/23 0920 91     Resp 10/09/23 0920 18     Temp 10/09/23 0920 (!) 97.2 F (36.2 C)     Temp src --      SpO2 10/09/23 0920 98 %     Weight --      Height --      Head Circumference --      Peak Flow --      Pain Score 10/09/23 0919 0     Pain Loc --      Pain Education --      Exclude from Growth Chart --    No data found.  Updated Vital Signs BP 116/84   Pulse 91   Temp (!) 97.2 F (36.2 C)   Resp 18   SpO2 98%   Visual Acuity Right Eye Distance:   Left Eye Distance:   Bilateral Distance:    Right Eye Near:   Left Eye Near:    Bilateral Near:     Physical Exam Constitutional:      Appearance: Normal appearance.  Eyes:     Extraocular Movements: Extraocular movements intact.  Pulmonary:     Effort: Pulmonary effort is normal.  Abdominal:     General: Abdomen is flat. Bowel sounds are normal.     Palpations: Abdomen is soft.     Tenderness: There is abdominal tenderness in the  suprapubic area. There is left CVA tenderness.  Neurological:     Mental Status: She is alert and oriented to person, place, and time. Mental status is at baseline.      UC Treatments / Results  Labs (all labs ordered are listed, but only abnormal results are displayed) Labs Reviewed  POCT URINALYSIS DIP (MANUAL ENTRY) - Abnormal; Notable for the following components:      Result Value   Clarity, UA cloudy (*)    Blood, UA moderate (*)    Protein Ur, POC >=300 (*)    Leukocytes, UA Small (1+) (*)    All other components within normal limits  URINE CULTURE    EKG   Radiology No results found.  Procedures Procedures (including critical care time)  Medications Ordered in UC Medications - No data to display  Initial Impression / Assessment and Plan / UC Course  I have reviewed the triage vital signs and the nursing notes.  Pertinent labs & imaging results that were available during my care of the patient were reviewed by me and considered in my medical decision making (see chart for details).  Urinary frequency  Urinalysis showing leukocytes, hemoglobin and protein, negative for nitrates, sent for culture, discussed findings with patient, last culture available showing Klebsiella pneumoniae therefore prophylactically started on ciprofloxacin for treatment as it is susceptible, recommended continued supportive care and advised follow-up with her specialist if symptoms continue Final Clinical Impressions(s) / UC Diagnoses   Final diagnoses:  Urinary frequency     Discharge Instructions      Your urinalysis at this time does not show bacteria, your urine will be sent to the lab to determine exactly which bacteria is present, if any changes need to be made to your medications you will be notified  Begin use of ciprofloxacin twice daily for 5 days  this antibiotic was chosen off of your last urine culture which showed Klebsiella pneumoniae  You may use over-the-counter  Azo to help minimize your symptoms until antibiotic removes bacteria, this medication will turn your urine orange  Increase your fluid intake through use of water  As always practice good hygiene, wiping front to back and avoidance of scented vaginal products to prevent further irritation  If symptoms continue to persist after use of medication or recur please follow-up with urgent care or your primary doctor as needed    ED Prescriptions     Medication Sig Dispense Auth. Provider   ciprofloxacin (CIPRO) 500 MG tablet Take 1 tablet (500 mg total) by mouth 2 (two) times daily for 5 days. 10 tablet Sibley Rolison R, NP      PDMP not reviewed this encounter.   Reena Canning, [place] 10/09/23 [phone]

## 2023-10-11 ENCOUNTER — Telehealth: Payer: Self-pay

## 2023-10-11 LAB — URINE CULTURE: Culture: 30000 — AB

## 2023-10-11 MED ORDER — SULFAMETHOXAZOLE-TRIMETHOPRIM 800-160 MG PO TABS: 1.0000 | ORAL_TABLET | Freq: Two times a day (BID) | ORAL | 0 refills | Status: AC

## 2023-10-11 NOTE — Telephone Encounter (Signed)
 Per protocol, pt to dc Cipro  and begin treatment with Bactrim .  Attempted to reach patient x1. LVM.  Rx sent to pharmacy on file.

## 2024-01-22 ENCOUNTER — Emergency Department

## 2024-01-22 ENCOUNTER — Emergency Department (EMERGENCY_DEPARTMENT_HOSPITAL)
Admission: EM | Admit: 2024-01-22 | Discharge: 2024-01-23 | Disposition: A | Discharge status: Other Acute Inpatient Hospital | Attending: Emergency Medicine | Admitting: Emergency Medicine

## 2024-01-22 ENCOUNTER — Encounter: Payer: Self-pay | Admitting: Emergency Medicine

## 2024-01-22 ENCOUNTER — Other Ambulatory Visit: Payer: Self-pay

## 2024-01-22 DIAGNOSIS — R4182 Altered mental status, unspecified: Secondary | ICD-10-CM | POA: Insufficient documentation

## 2024-01-22 DIAGNOSIS — S50311A Abrasion of right elbow, initial encounter: Secondary | ICD-10-CM | POA: Insufficient documentation

## 2024-01-22 DIAGNOSIS — F39 Unspecified mood [affective] disorder: Secondary | ICD-10-CM | POA: Insufficient documentation

## 2024-01-22 DIAGNOSIS — F29 Unspecified psychosis not due to a substance or known physiological condition: Secondary | ICD-10-CM | POA: Insufficient documentation

## 2024-01-22 DIAGNOSIS — R4589 Other symptoms and signs involving emotional state: Secondary | ICD-10-CM

## 2024-01-22 DIAGNOSIS — X838XXA Intentional self-harm by other specified means, initial encounter: Secondary | ICD-10-CM | POA: Insufficient documentation

## 2024-01-22 LAB — CBC WITH DIFFERENTIAL/PLATELET
Abs Immature Granulocytes: 0.01 K/uL (ref 0.00–0.07)
Basophils Absolute: 0.1 K/uL (ref 0.0–0.1)
Basophils Relative: 1 %
Eosinophils Absolute: 0.3 K/uL (ref 0.0–0.5)
Eosinophils Relative: 6 %
HCT: 36.7 % (ref 36.0–46.0)
Hemoglobin: 11.7 g/dL — ABNORMAL LOW (ref 12.0–15.0)
Immature Granulocytes: 0 %
Lymphocytes Relative: 29 %
Lymphs Abs: 1.2 K/uL (ref 0.7–4.0)
MCH: 29.3 pg (ref 26.0–34.0)
MCHC: 31.9 g/dL (ref 30.0–36.0)
MCV: 92 fL (ref 80.0–100.0)
Monocytes Absolute: 0.4 K/uL (ref 0.1–1.0)
Monocytes Relative: 8 %
Neutro Abs: 2.3 K/uL (ref 1.7–7.7)
Neutrophils Relative %: 56 %
Platelets: 432 K/uL — ABNORMAL HIGH (ref 150–400)
RBC: 3.99 MIL/uL (ref 3.87–5.11)
RDW: 13.7 % (ref 11.5–15.5)
WBC: 4.2 K/uL (ref 4.0–10.5)
nRBC: 0 % (ref 0.0–0.2)

## 2024-01-22 LAB — COMPREHENSIVE METABOLIC PANEL WITH GFR
ALT: 88 U/L — ABNORMAL HIGH (ref 0–44)
AST: 78 U/L — ABNORMAL HIGH (ref 15–41)
Albumin: 3.7 g/dL (ref 3.5–5.0)
Alkaline Phosphatase: 73 U/L (ref 38–126)
Anion gap: 8 (ref 5–15)
BUN: 19 mg/dL (ref 6–20)
CO2: 27 mmol/L (ref 22–32)
Calcium: 9 mg/dL (ref 8.9–10.3)
Chloride: 105 mmol/L (ref 98–111)
Creatinine, Ser: 0.69 mg/dL (ref 0.44–1.00)
GFR, Estimated: >60 mL/min (ref >60)
Glucose, Bld: 109 mg/dL — ABNORMAL HIGH (ref 70–99)
Potassium: 3.9 mmol/L (ref 3.5–5.1)
Sodium: 140 mmol/L (ref 135–145)
Total Bilirubin: 0.5 mg/dL (ref 0.0–1.2)
Total Protein: 7 g/dL (ref 6.5–8.1)

## 2024-01-22 LAB — URINE DRUG SCREEN, QUALITATIVE (ARMC ONLY)
Amphetamines, Ur Screen: NOT DETECTED
Barbiturates, Ur Screen: NOT DETECTED
Benzodiazepine, Ur Scrn: POSITIVE — AB
Cannabinoid 50 Ng, Ur ~~LOC~~: NOT DETECTED
Cocaine Metabolite,Ur ~~LOC~~: NOT DETECTED
MDMA (Ecstasy)Ur Screen: NOT DETECTED
Methadone Scn, Ur: NOT DETECTED
Opiate, Ur Screen: NOT DETECTED
Phencyclidine (PCP) Ur S: NOT DETECTED
Tricyclic, Ur Screen: POSITIVE — AB

## 2024-01-22 LAB — URINALYSIS, ROUTINE W REFLEX MICROSCOPIC
Bilirubin Urine: NEGATIVE
Glucose, UA: NEGATIVE mg/dL
Ketones, ur: NEGATIVE mg/dL
Leukocytes,Ua: NEGATIVE
Nitrite: NEGATIVE
Protein, ur: 100 mg/dL — AB
Specific Gravity, Urine: 1.012 (ref 1.005–1.030)
pH: 7 (ref 5.0–8.0)

## 2024-01-22 LAB — ETHANOL: Alcohol, Ethyl (B): <15 mg/dL (ref <15)

## 2024-01-22 LAB — ACETAMINOPHEN LEVEL: Acetaminophen (Tylenol), Serum: <10 ug/mL — ABNORMAL LOW (ref 10–30)

## 2024-01-22 LAB — PREGNANCY, URINE: Preg Test, Ur: NEGATIVE

## 2024-01-22 LAB — SALICYLATE LEVEL: Salicylate Lvl: <7 mg/dL — ABNORMAL LOW (ref 7.0–30.0)

## 2024-01-22 MED ORDER — FLUDROCORTISONE ACETATE 0.1 MG PO TABS
100.0000 ug | ORAL_TABLET | Freq: Every day | ORAL | Status: DC
  Administered 2024-01-23: 100 ug via ORAL
  Filled 2024-01-22: qty 1

## 2024-01-22 MED ORDER — METOPROLOL SUCCINATE ER 25 MG PO TB24
12.5000 mg | ORAL_TABLET | Freq: Every day | ORAL | Status: DC
  Administered 2024-01-23: 12.5 mg via ORAL
  Filled 2024-01-22: qty 1

## 2024-01-22 MED ORDER — ESTROGENS CONJUGATED 0.625 MG/GM VA CREA
1.0000 | TOPICAL_CREAM | VAGINAL | Status: DC
  Filled 2024-01-22: qty 30

## 2024-01-22 MED ORDER — NORTRIPTYLINE HCL 10 MG PO CAPS
10.0000 mg | ORAL_CAPSULE | Freq: Two times a day (BID) | ORAL | Status: DC
  Administered 2024-01-22 (×2): 10 mg via ORAL
  Filled 2024-01-22 (×2): qty 1

## 2024-01-22 MED ORDER — METHOTREXATE SODIUM 2.5 MG PO TABS
15.0000 mg | ORAL_TABLET | ORAL | Status: DC
  Filled 2024-01-22: qty 6

## 2024-01-22 MED ORDER — MIDAZOLAM HCL (PF) 10 MG/2ML IJ SOLN
5.0000 mg | Freq: Once | INTRAMUSCULAR | Status: AC
  Administered 2024-01-22 (×2): 5 mg via INTRAMUSCULAR
  Filled 2024-01-22: qty 2

## 2024-01-22 MED ORDER — ESCITALOPRAM OXALATE 10 MG PO TABS
5.0000 mg | ORAL_TABLET | Freq: Every day | ORAL | Status: DC
  Administered 2024-01-23: 5 mg via ORAL
  Filled 2024-01-22: qty 1

## 2024-01-22 MED ORDER — FOLIC ACID 1 MG PO TABS
1.0000 mg | ORAL_TABLET | Freq: Every day | ORAL | Status: DC
  Administered 2024-01-23: 1 mg via ORAL
  Filled 2024-01-22: qty 1

## 2024-01-22 MED ORDER — CYANOCOBALAMIN 500 MCG PO TABS
1000.0000 ug | ORAL_TABLET | Freq: Every day | ORAL | Status: DC
  Administered 2024-01-23: 1000 ug via ORAL
  Filled 2024-01-22: qty 2

## 2024-01-22 MED ORDER — INDOMETHACIN 50 MG PO CAPS
50.0000 mg | ORAL_CAPSULE | Freq: Two times a day (BID) | ORAL | Status: DC
  Administered 2024-01-23: 50 mg via ORAL
  Filled 2024-01-22 (×2): qty 1

## 2024-01-22 MED ORDER — ATORVASTATIN CALCIUM 20 MG PO TABS
40.0000 mg | ORAL_TABLET | Freq: Every day | ORAL | Status: DC
  Filled 2024-01-22: qty 2

## 2024-01-22 MED ORDER — METHENAMINE HIPPURATE 1 G PO TABS: 1.0000 g | ORAL_TABLET | Freq: Every day | ORAL | Status: DC

## 2024-01-22 MED ORDER — TOPIRAMATE 25 MG PO TABS
50.0000 mg | ORAL_TABLET | Freq: Three times a day (TID) | ORAL | Status: DC
  Administered 2024-01-22 – 2024-01-23 (×4): 50 mg via ORAL
  Filled 2024-01-22 (×3): qty 2

## 2024-01-22 MED ORDER — IVABRADINE HCL 5 MG PO TABS
5.0000 mg | ORAL_TABLET | Freq: Two times a day (BID) | ORAL | Status: DC
  Administered 2024-01-22 – 2024-01-23 (×3): 5 mg via ORAL
  Filled 2024-01-22 (×3): qty 1

## 2024-01-22 MED ORDER — DROPERIDOL 2.5 MG/ML IJ SOLN
5.0000 mg | Freq: Once | INTRAMUSCULAR | Status: AC
  Administered 2024-01-22 (×2): 5 mg via INTRAMUSCULAR
  Filled 2024-01-22: qty 2

## 2024-01-22 MED ORDER — GABAPENTIN 300 MG PO CAPS
1200.0000 mg | ORAL_CAPSULE | Freq: Every day | ORAL | Status: DC
  Administered 2024-01-22 (×2): 1200 mg via ORAL
  Filled 2024-01-22: qty 4

## 2024-01-22 NOTE — ED Notes (Signed)
 Patient refused lunch tray.
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
## 2024-01-22 NOTE — ED Notes (Signed)
 Patient given ice water

## 2024-01-22 NOTE — ED Notes (Signed)
IVC PENDING  CONSULT  AND  PLACEMENT 

## 2024-01-22 NOTE — ED Notes (Signed)
 Patient declined bedtime snack.

## 2024-01-22 NOTE — ED Notes (Signed)
 Arrives in bilateral upper extremity forensic restraints with [facility] at bedside.

## 2024-01-22 NOTE — ED Provider Notes (Addendum)
 Notified by RN that patient had a fall, did report that she hit her head.  No identified injuries.  Do also note that this appears to be patient's first visit for similar symptoms, will obtain head CT to further evaluate.   Levander Slate, MD 01/22/24 1102  Head CT resulted without acute findings.  Received update from psychiatry team that they do recommend inpatient admission.  Do think patient can be medically cleared for psychiatric evaluation.   Levander Slate, MD 01/22/24 [phone]

## 2024-01-22 NOTE — ED Notes (Signed)
 Pt belongings include:   2 tie-dye shoes, 1 pink/white pant, 1 purple sweatshirt, 1 black brief, 1 cell phone in clear case, 1 set of keys, 1 brown hair tie, 1 pair of eye glasses   All placed into labeled personal belongings bag and taken to locked locker.

## 2024-01-22 NOTE — ED Notes (Signed)
 Pt released from bilateral upper extremity forensic restraints by [facility]. Pt will not follow commands, pt is argumentative and will not contract for safety. EDP at bedside to assess.
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
## 2024-01-22 NOTE — ED Notes (Signed)
 Psych team at bedside .

## 2024-01-22 NOTE — ED Notes (Signed)
 Pt ambulatory to bathroom without any difficulty.

## 2024-01-22 NOTE — BH Assessment (Signed)
 Comprehensive Clinical Assessment (CCA) Screening, Triage and Referral Note  01/22/2024 Lisa Clay [id number]  Chief Complaint:  Chief Complaint  Patient presents with   Mental Health Problem   Lisa Clay is a 53 y.o. female who presents to the [facility] ED involuntarily for evaluation. Per triage note,     Patient presents under IVC with local law enforcement for evaluation of suicidal ideations, agitation.  Per IVC documentation and report from law enforcement, patient went to her neighbor's house requesting a firearm to kill herself with.  She is quite agitated on arrival requiring intramuscular calming agents to facilitate staff and patient safety.  During TTS assessment patient presented with flat affect and irritable mood. Patient refused to answer most questions during assessment stating "I need to call [employer]", reporting her ex works for them. She has preoccupied thoughts about not knowing where her 73 yo son is. She says " I have no idea where the fuck my son is"  and states he has been missing since Friday. She reports she went to her neighbors' house because she got locked out and she was looking for her son. She stated that she has a 51 yo daughter and a 13 yo son and when asked could we use them as collateral support she declined stating she does not want use to call them. She refused to cooperate any further with interview answering "no" to every question.   Visit Diagnosis: Unspecified Mood Disorder   Patient Reported Information How did you hear about us ? No data recorded What Is the Reason for Your Visit/Call Today? Evaluation  How Long Has This Been Causing You Problems? No data recorded What Do You Feel Would Help You the Most Today? Treatment for Depression or other mood problem   Have You Recently Had Any Thoughts About Hurting Yourself? No  Are You Planning to Commit Suicide/Harm Yourself At This time? No   Have you Recently Had Thoughts About  Hurting Someone Sherral? No  Are You Planning to Harm Someone at This Time? No data recorded Explanation: No data recorded  Have You Used Any Alcohol or Drugs in the Past 24 Hours? No  How Long Ago Did You Use Drugs or Alcohol? No data recorded What Did You Use and How Much? No data recorded  Do You Currently Have a Therapist/Psychiatrist? No  Name of Therapist/Psychiatrist: No data recorded  Have You Been Recently Discharged From Any Office Practice or Programs? No data recorded Explanation of Discharge From Practice/Program: No data recorded   CCA Screening Triage Referral Assessment Type of Contact: Face-to-Face  Telemedicine Service Delivery:   Is this Initial or Reassessment?   Date Telepsych consult ordered in [facility]:    Time Telepsych consult ordered in [facility]:    Location of Assessment: [facility] ED  Provider Location: [facility] ED    Collateral Involvement: No data recorded  Does Patient Have a Court Appointed Legal Guardian? No data recorded Name and Contact of Legal Guardian: No data recorded If Minor and Not Living with Parent(s), Who has Custody? No data recorded Is CPS involved or ever been involved? No data recorded Is APS involved or ever been involved? No data recorded  Patient Determined To Be At Risk for Harm To Self or Others Based on Review of Patient Reported Information or Presenting Complaint? No data recorded Method: No data recorded Availability of Means: No data recorded Intent: No data recorded Notification Required: No data recorded Additional Information for Danger to Others Potential: No data  recorded Additional Comments for Danger to Others Potential: No data recorded Are There Guns or Other Weapons in Your Home? No data recorded Types of Guns/Weapons: No data recorded Are These Weapons Safely Secured?                            No data recorded Who Could Verify You Are Able To Have These Secured: No data recorded Do You Have any Outstanding Charges,  Pending Court Dates, Parole/Probation? No data recorded Contacted To Inform of Risk of Harm To Self or Others: No data recorded  Does Patient Present under Involuntary Commitment? Yes    [place] of Residence: [place]   Patient Currently Receiving the Following Services: No data recorded  Determination of Need: Emergent (2 hours)   Options For Referral: Inpatient Hospitalization; Medication Management   Disposition Recommendation per psychiatric provider: We recommend inpatient psychiatric hospitalization after medical hospitalization. Patient has been involuntarily committed on 01/22/24.   Seiji Wiswell E Saul Fabiano

## 2024-01-22 NOTE — ED Notes (Signed)
 Patient to CT at this time

## 2024-01-22 NOTE — Group Note (Deleted)
 Date:  01/22/2024 Time:  2:21 PM  Group Topic/Focus:  Wellness Toolbox:   The focus of this group is to discuss various aspects of wellness, balancing those aspects and exploring ways to increase the ability to experience wellness.  Patients will create a wellness toolbox for use upon discharge.     Participation Level:  {BHH PARTICIPATION OZCZO:77735}  Participation Quality:  {BHH PARTICIPATION QUALITY:22265}  Affect:  {BHH AFFECT:22266}  Cognitive:  {BHH COGNITIVE:22267}  Insight: {BHH Insight2:20797}  Engagement in Group:  {BHH ENGAGEMENT IN HMNLE:77731}  Modes of Intervention:  {BHH MODES OF INTERVENTION:22269}  Additional Comments:  ***  Myra Curtistine BROCKS 01/22/2024, 2:21 PM

## 2024-01-22 NOTE — ED Notes (Signed)
 Pt attempting to kick RN, attempting to bite security, will not contract for safety and continues to threaten staff with further hitting, biting and kicking verbal threats. Placed in manual hold for both pt and staff safety.

## 2024-01-22 NOTE — ED Notes (Signed)
 Patient refusing CT- MD Ray aware.

## 2024-01-22 NOTE — BH Assessment (Signed)
 Patient Faxed out to Multiple Facility per [facility]   Destination  Service Provider Request Status Services Address Phone Fax Patient Preferred  [facility]  Pending - Request Lisa Clay [place] [address] [phone] --  [facility]  Pending - Request Sent -- [address] [place] [address] [phone] --  [facility]  Pending - Request Sent -- [address] [place] [address] [phone] --  [facility]  Pending - Request Sent -- [address] [place], [place] [address] [phone] --  [facility]  Pending - Request Sent -- [address] [place], [place] [address] [phone] --  [facility]  Pending - Request Sent -- [address] [place] [address] [phone] --  [facility]  Pending - Request Sent -- [address], [place] [address] [phone] --  [facility]  Pending - Request Sent -- [address] [place] [address] [phone] --  [facility]  Pending - Request Sent -- [address] [place] [address] [phone] --  [facility]  Pending - Request Sent -- [address] [place] [address] [phone] --  [facility]  Pending - Request Sent -- [address] [place] [address] [phone] --  [facility]  Pending - Request Sent -- [place] [phone] --  [facility]  Pending - Request Sent -- [address] [place] [address] [phone] --  [facility]  Pending - Request Sent -- [address] [place] [address] [phone] --  [facility]  Pending - Request Sent -- [address] [place] [address] [phone] --  [facility]  Pending - Request Sent -- [address], [place] [address] [phone] --  [facility]  Pending - Request Sent -- [address], [place] [address] [phone] --  [facility]  Pending - Request Sent -- [address] [place] [address]

## 2024-01-22 NOTE — ED Provider Notes (Signed)
 [facility] Provider Note    Event Date/Time   First MD Initiated Contact with Patient 01/22/24 0503     (approximate)   History   Mental Health Problem   HPI  Lisa Clay is a 53 y.o. female who presents to the ED for evaluation of Mental Health Problem   Patient presents under IVC with local law enforcement for evaluation of suicidal ideations, agitation.  Per IVC documentation and report from law enforcement, patient went to her neighbor's house requesting a firearm to kill herself with.  She is quite agitated on arrival requiring intramuscular calming agents to facilitate staff and patient safety   Physical Exam   Triage Vital Signs: ED Triage Vitals  Encounter Vitals Group     BP      Girls Systolic BP Percentile      Girls Diastolic BP Percentile      Boys Systolic BP Percentile      Boys Diastolic BP Percentile      Pulse      Resp      Temp      Temp src      SpO2      Weight      Height      Head Circumference      Peak Flow      Pain Score      Pain Loc      Pain Education      Exclude from Growth Chart     Most recent vital signs: Vitals:   01/22/24 0604 01/22/24 0605  BP:  95/62  Pulse:  68  Resp:  14  Temp:  98.1 F (36.7 C)  SpO2: 100%     General: Awake, agitated CV:  Good peripheral perfusion.  Resp:  Normal effort.  Abd:  No distention.  MSK:  No deformity noted.  Abrasion to the right elbow but full active and passive range of motion without any apparent discomfort Neuro:  No focal deficits appreciated. Other:     ED Results / Procedures / Treatments   Labs (all labs ordered are listed, but only abnormal results are displayed) Labs Reviewed  COMPREHENSIVE METABOLIC PANEL WITH GFR  ETHANOL  URINE DRUG SCREEN, QUALITATIVE ([facility] ONLY)  CBC WITH DIFFERENTIAL/PLATELET  ACETAMINOPHEN  LEVEL  SALICYLATE LEVEL  URINALYSIS, ROUTINE W REFLEX MICROSCOPIC    EKG Sinus rhythm with a rate of 68  bpm.  Normal axis and intervals.  QTc 433.  No STEMI.  RADIOLOGY   Official radiology report(s): No results found.  PROCEDURES and INTERVENTIONS:  .Critical Care  Performed by: Claudene Rover, MD Authorized by: Claudene Rover, MD   Critical care provider statement:    Critical care time (minutes):  30   Critical care time was exclusive of:  Separately billable procedures and treating other patients   Critical care was necessary to treat or prevent imminent or life-threatening deterioration of the following conditions:  Toxidrome   Critical care was time spent personally by me on the following activities:  Development of treatment plan with patient or surrogate, discussions with consultants, evaluation of patient's response to treatment, examination of patient, ordering and review of laboratory studies, ordering and review of radiographic studies, ordering and performing treatments and interventions, pulse oximetry, re-evaluation of patient's condition and review of old charts   Medications  midazolam  PF (VERSED ) injection 5 mg (5 mg Intramuscular Given 01/22/24 0528)  droperidol  (INAPSINE ) 2.5 MG/ML injection 5 mg (5 mg Intramuscular Given 01/22/24  Emre.Estelle)     IMPRESSION / MDM / ASSESSMENT AND PLAN / ED COURSE  I reviewed the triage vital signs and the nursing notes.  Differential diagnosis includes, but is not limited to, medication noncompliance, overdose, polysubstance abuse, acute intoxication  {Patient presents with symptoms of an acute illness or injury that is potentially life-threatening.  Patient presents under IVC for suicidal ideations acutely agitated.  Difficult to get much history from her on arrival.  Requires intramuscular calming agents.  No clear signs of self-harm yet.  No particular toxidromes.  Normal QTc.  Pending blood work.  No clear signs of trauma or medical derangements to preclude psychiatric evaluation and disposition.  Clinical Course as of 01/22/24 9366   Wed Jan 22, 2024  0551 Reassessed, manual hold has been released. [DS]    Clinical Course User Index [DS] Claudene Rover, MD     FINAL CLINICAL IMPRESSION(S) / ED DIAGNOSES   Final diagnoses:  Suicidal behavior without attempted self-injury     Rx / DC Orders   ED Discharge Orders     None        Note:  This document was prepared using Dragon voice recognition software and may include unintentional dictation errors.   Claudene Rover, MD 01/22/24 [phone]

## 2024-01-22 NOTE — ED Notes (Signed)
 This RN was called by NT who reported patient was found on the floor in room. This RN to room, NT Bobbette, Nurse tech Duwaine and Vernell were assisting patient to standing position back to bed. Patient reported hitting the side of her head on the bed railing. Patient was alert, but irritable. Patient reported the food tray wasn't in the right spot. Patient able to tell this RN name and date of birth. Patient requesting to call family and asking when she can go home. Patient reported she had to urinate, patient assisted by multiple staff members to bed side commode. Patient very unsteady and requiring extensive assistance at this time. NT Felicia and Janeeta assisted patient back to bed after peri-care completed. Charge RN Jon and MD Ray aware, CT scan ordered. 1 to 1 sitter placed back with patient at this time.

## 2024-01-22 NOTE — ED Triage Notes (Signed)
 Pt arrived via [facility]. Under IVC and forensic hand restraints. Per affidavit, pt has hx/o mental health disorders including stay at [facility]. Pt went to neighbors home and requested a firearm stating, Get me a gun so I can kill myself. Pts 53 y/o son refuses to stay at the home for fear of his safety. Family on scene reported to law enforcement that pt has undiagnosed mental health issues that she refuses to consult medical professionals for. Pt has also not been sleeping, eating and consuming alcohol only. Law Enforcement found on pts nightstand a Clinical research associate Will from 01/19/2024. When pt arrived pt refused to cooperate, attempted to bite and kick at staff and officers, refused to stay in a sitting position and continued to attempt to stand or thrash in different directions. Pt also refused for nursing staff to assist in putting pts underwear and pants back on that she had removed in back seat of officers car. Pt argumentative with staff and refused to comply with all request made or directions provided.

## 2024-01-22 NOTE — ED Notes (Addendum)
 Writer out to assist [employer] per request due to pt with her clothing off in the back of their vehicle. Writer attempting to reason with pt and pull her underwear and pants on with complete resistance from pt by not standing and kicking her legs. Pt then placed in wheelchair by [facility] with blanket and towel around her bare half and wheeled to room 21. Pt continues to be in forensic restraints while brought into ED and in wheelchair. Once pt in room, pt requesting to go to bathroom. Sheriffs deputy holding onto pt by her restrained arms and Clinical research associate to the left of pt attempt to stand pt to sit on bed when pt thrashed out of officers grip and attempted to take a step forward, when she fell forward, hit her right elbow and shoulder on garage door and fell onto her bottom, striking her posterior head on the garage door.Pt assisted up by staff and sheriffs deputy and onto the bed. Pt continuing to resist and trying to bite staff when attempting to cover her with blankets due to her still bare from waist down. EDP Smith in room to assess pt post fall and for initial assessment. Pt arguing with provider. Post fall, pt has abrasion to the right elbow with need for simple dressing such as a band-aid. Pt denies pain when asked and sts, yeah all of you are a pain.

## 2024-01-22 NOTE — Consult Note (Signed)
 Patient pending acceptance to a few facilities but according to NP she is unstable for transport. Patient will need to be re-faxed out tomorrow when stable.

## 2024-01-22 NOTE — ED Notes (Signed)
 Pt ambulatory to bathroom at this time. Pt asking for toothbrush and toothpaste. Pt was given both and both were taken away from pt once she was done with them.

## 2024-01-22 NOTE — ED Notes (Signed)
 This RN had discussion with patient about CT scan- patient wishes to make contact with family before going. MD Ray updated.

## 2024-01-23 ENCOUNTER — Other Ambulatory Visit: Payer: Self-pay

## 2024-01-23 ENCOUNTER — Inpatient Hospital Stay
Admission: RE | Admit: 2024-01-23 | Discharge: 2024-01-31 | DRG: 885 | Disposition: A | Discharge status: Home / Self Care | Attending: Psychiatry | Admitting: Psychiatry

## 2024-01-23 ENCOUNTER — Encounter: Payer: Self-pay | Admitting: Psychiatry

## 2024-01-23 DIAGNOSIS — G90A Postural orthostatic tachycardia syndrome (POTS): Secondary | ICD-10-CM | POA: Diagnosis present

## 2024-01-23 DIAGNOSIS — Z9152 Personal history of nonsuicidal self-harm: Secondary | ICD-10-CM | POA: Diagnosis not present

## 2024-01-23 DIAGNOSIS — Z7952 Long term (current) use of systemic steroids: Secondary | ICD-10-CM | POA: Diagnosis not present

## 2024-01-23 DIAGNOSIS — T424X2A Poisoning by benzodiazepines, intentional self-harm, initial encounter: Secondary | ICD-10-CM | POA: Diagnosis present

## 2024-01-23 DIAGNOSIS — Z604 Social exclusion and rejection: Secondary | ICD-10-CM | POA: Diagnosis present

## 2024-01-23 DIAGNOSIS — Z888 Allergy status to other drugs, medicaments and biological substances status: Secondary | ICD-10-CM

## 2024-01-23 DIAGNOSIS — Z79631 Long term (current) use of antimetabolite agent: Secondary | ICD-10-CM

## 2024-01-23 DIAGNOSIS — F419 Anxiety disorder, unspecified: Secondary | ICD-10-CM | POA: Diagnosis present

## 2024-01-23 DIAGNOSIS — B9620 Unspecified Escherichia coli [E. coli] as the cause of diseases classified elsewhere: Secondary | ICD-10-CM | POA: Diagnosis present

## 2024-01-23 DIAGNOSIS — N390 Urinary tract infection, site not specified: Secondary | ICD-10-CM | POA: Diagnosis present

## 2024-01-23 DIAGNOSIS — Z881 Allergy status to other antibiotic agents status: Secondary | ICD-10-CM

## 2024-01-23 DIAGNOSIS — Z79899 Other long term (current) drug therapy: Secondary | ICD-10-CM

## 2024-01-23 DIAGNOSIS — G43909 Migraine, unspecified, not intractable, without status migrainosus: Secondary | ICD-10-CM | POA: Diagnosis present

## 2024-01-23 DIAGNOSIS — F3181 Bipolar II disorder: Principal | ICD-10-CM | POA: Diagnosis present

## 2024-01-23 DIAGNOSIS — Z8249 Family history of ischemic heart disease and other diseases of the circulatory system: Secondary | ICD-10-CM | POA: Diagnosis not present

## 2024-01-23 DIAGNOSIS — R809 Proteinuria, unspecified: Secondary | ICD-10-CM | POA: Diagnosis present

## 2024-01-23 DIAGNOSIS — G4700 Insomnia, unspecified: Secondary | ICD-10-CM | POA: Diagnosis present

## 2024-01-23 DIAGNOSIS — Z560 Unemployment, unspecified: Secondary | ICD-10-CM | POA: Diagnosis not present

## 2024-01-23 DIAGNOSIS — F39 Unspecified mood [affective] disorder: Secondary | ICD-10-CM | POA: Diagnosis not present

## 2024-01-23 DIAGNOSIS — Z638 Other specified problems related to primary support group: Secondary | ICD-10-CM | POA: Diagnosis not present

## 2024-01-23 LAB — CBG MONITORING, ED: Glucose-Capillary: 129 mg/dL — ABNORMAL HIGH (ref 70–99)

## 2024-01-23 MED ORDER — HALOPERIDOL LACTATE 5 MG/ML IJ SOLN: 10.0000 mg | Freq: Three times a day (TID) | INTRAMUSCULAR | Status: DC | PRN

## 2024-01-23 MED ORDER — ACETAMINOPHEN 500 MG PO TABS
1000.0000 mg | ORAL_TABLET | Freq: Four times a day (QID) | ORAL | Status: DC | PRN
  Administered 2024-01-23 (×2): 1000 mg via ORAL
  Filled 2024-01-23 (×2): qty 2

## 2024-01-23 MED ORDER — ATORVASTATIN CALCIUM 20 MG PO TABS: 40.0000 mg | ORAL_TABLET | Freq: Every day | ORAL | Status: DC

## 2024-01-23 MED ORDER — METHENAMINE MANDELATE 0.5 G PO TABS
1000.0000 mg | ORAL_TABLET | Freq: Four times a day (QID) | ORAL | Status: DC
  Administered 2024-01-23 (×2): 1000 mg via ORAL
  Filled 2024-01-23 (×4): qty 2

## 2024-01-23 MED ORDER — METHENAMINE MANDELATE 0.5 G PO TABS
1000.0000 mg | ORAL_TABLET | Freq: Four times a day (QID) | ORAL | Status: DC
  Administered 2024-01-23 – 2024-01-25 (×5): 1000 mg via ORAL
  Filled 2024-01-23 (×8): qty 2

## 2024-01-23 MED ORDER — ALUM & MAG HYDROXIDE-SIMETH 200-200-20 MG/5ML PO SUSP: 30.0000 mL | ORAL | Status: DC | PRN

## 2024-01-23 MED ORDER — HYDROXYZINE HCL 25 MG PO TABS
25.0000 mg | ORAL_TABLET | Freq: Once | ORAL | Status: AC
  Administered 2024-01-23: 25 mg via ORAL
  Filled 2024-01-23: qty 1

## 2024-01-23 MED ORDER — NORTRIPTYLINE HCL 10 MG PO CAPS: 10.0000 mg | ORAL_CAPSULE | Freq: Every day | ORAL | Status: DC

## 2024-01-23 MED ORDER — INDOMETHACIN 50 MG PO CAPS
50.0000 mg | ORAL_CAPSULE | ORAL | Status: AC
  Administered 2024-01-23: 50 mg via ORAL
  Filled 2024-01-23: qty 1

## 2024-01-23 MED ORDER — METOPROLOL SUCCINATE ER 25 MG PO TB24
12.5000 mg | ORAL_TABLET | Freq: Every day | ORAL | Status: DC
  Administered 2024-01-24 – 2024-01-31 (×8): 12.5 mg via ORAL
  Filled 2024-01-23 (×9): qty 1

## 2024-01-23 MED ORDER — VITAMIN B-12 1000 MCG PO TABS
1000.0000 ug | ORAL_TABLET | Freq: Every day | ORAL | Status: DC
  Administered 2024-01-24 – 2024-01-31 (×8): 1000 ug via ORAL
  Filled 2024-01-23 (×8): qty 1

## 2024-01-23 MED ORDER — MAGNESIUM HYDROXIDE 400 MG/5ML PO SUSP: 30.0000 mL | Freq: Every day | ORAL | Status: DC | PRN

## 2024-01-23 MED ORDER — ACETAMINOPHEN 325 MG PO TABS
650.0000 mg | ORAL_TABLET | Freq: Four times a day (QID) | ORAL | Status: DC | PRN
  Administered 2024-01-25 – 2024-01-26 (×4): 650 mg via ORAL
  Filled 2024-01-23 (×4): qty 2

## 2024-01-23 MED ORDER — HALOPERIDOL LACTATE 5 MG/ML IJ SOLN: 5.0000 mg | Freq: Three times a day (TID) | INTRAMUSCULAR | Status: DC | PRN

## 2024-01-23 MED ORDER — FAMOTIDINE 20 MG PO TABS: 20.0000 mg | ORAL_TABLET | Freq: Two times a day (BID) | ORAL | Status: DC

## 2024-01-23 MED ORDER — DIPHENHYDRAMINE HCL 50 MG/ML IJ SOLN: 50.0000 mg | Freq: Three times a day (TID) | INTRAMUSCULAR | Status: DC | PRN

## 2024-01-23 MED ORDER — ESCITALOPRAM OXALATE 10 MG PO TABS
5.0000 mg | ORAL_TABLET | Freq: Every day | ORAL | Status: DC
  Administered 2024-01-24: 5 mg via ORAL
  Filled 2024-01-23: qty 1

## 2024-01-23 MED ORDER — INDOMETHACIN 50 MG PO CAPS
50.0000 mg | ORAL_CAPSULE | Freq: Two times a day (BID) | ORAL | Status: DC
Start: 2024-01-24 — End: 2024-01-31
  Administered 2024-01-24 – 2024-01-31 (×15): 50 mg via ORAL
  Filled 2024-01-23 (×16): qty 1

## 2024-01-23 MED ORDER — FOLIC ACID 1 MG PO TABS
1.0000 mg | ORAL_TABLET | Freq: Every day | ORAL | Status: DC
  Administered 2024-01-24 – 2024-01-31 (×8): 1 mg via ORAL
  Filled 2024-01-23 (×8): qty 1

## 2024-01-23 MED ORDER — METHOTREXATE SODIUM 2.5 MG PO TABS
15.0000 mg | ORAL_TABLET | ORAL | Status: DC
  Administered 2024-01-24 – 2024-01-31 (×2): 15 mg via ORAL
  Filled 2024-01-23 (×2): qty 6

## 2024-01-23 MED ORDER — IVABRADINE HCL 5 MG PO TABS
5.0000 mg | ORAL_TABLET | Freq: Two times a day (BID) | ORAL | Status: DC
Start: 2024-01-24 — End: 2024-01-31
  Administered 2024-01-24 – 2024-01-31 (×15): 5 mg via ORAL
  Filled 2024-01-23 (×16): qty 1

## 2024-01-23 MED ORDER — TOPIRAMATE 25 MG PO TABS
50.0000 mg | ORAL_TABLET | Freq: Three times a day (TID) | ORAL | Status: DC
  Administered 2024-01-24 – 2024-01-31 (×23): 50 mg via ORAL
  Filled 2024-01-23 (×23): qty 2

## 2024-01-23 MED ORDER — HYDROXYZINE HCL 25 MG PO TABS
25.0000 mg | ORAL_TABLET | Freq: Four times a day (QID) | ORAL | Status: DC | PRN
  Administered 2024-01-23 – 2024-01-24 (×3): 25 mg via ORAL
  Filled 2024-01-23 (×3): qty 1

## 2024-01-23 MED ORDER — HALOPERIDOL 5 MG PO TABS: 5.0000 mg | ORAL_TABLET | Freq: Three times a day (TID) | ORAL | Status: DC | PRN

## 2024-01-23 MED ORDER — ESTROGENS CONJUGATED 0.625 MG/GM VA CREA
1.0000 | TOPICAL_CREAM | VAGINAL | Status: DC
  Administered 2024-01-24: 1 via VAGINAL
  Filled 2024-01-23: qty 30

## 2024-01-23 MED ORDER — ATORVASTATIN CALCIUM 20 MG PO TABS
40.0000 mg | ORAL_TABLET | Freq: Every day | ORAL | Status: DC
Start: 2024-01-23 — End: 2024-01-31
  Administered 2024-01-23 – 2024-01-30 (×8): 40 mg via ORAL
  Filled 2024-01-23 (×8): qty 2

## 2024-01-23 MED ORDER — NORTRIPTYLINE HCL 10 MG PO CAPS
10.0000 mg | ORAL_CAPSULE | Freq: Every day | ORAL | Status: DC
  Administered 2024-01-24 – 2024-01-30 (×7): 10 mg via ORAL
  Filled 2024-01-23 (×8): qty 1

## 2024-01-23 MED ORDER — FLUDROCORTISONE ACETATE 0.1 MG PO TABS
100.0000 ug | ORAL_TABLET | Freq: Every day | ORAL | Status: DC
  Administered 2024-01-24 – 2024-01-31 (×8): 100 ug via ORAL
  Filled 2024-01-23 (×8): qty 1

## 2024-01-23 MED ORDER — METHOTREXATE SODIUM 2.5 MG PO TABS: 15.0000 mg | ORAL_TABLET | ORAL | Status: DC

## 2024-01-23 MED ORDER — GABAPENTIN 400 MG PO CAPS
1200.0000 mg | ORAL_CAPSULE | Freq: Every day | ORAL | Status: DC
  Administered 2024-01-23 – 2024-01-30 (×8): 1200 mg via ORAL
  Filled 2024-01-23 (×8): qty 3

## 2024-01-23 MED ORDER — DIPHENHYDRAMINE HCL 50 MG/ML IJ SOLN
50.0000 mg | Freq: Three times a day (TID) | INTRAMUSCULAR | Status: DC | PRN
Start: 2024-01-23 — End: 2024-01-31

## 2024-01-23 MED ORDER — LORAZEPAM 2 MG/ML IJ SOLN: 2.0000 mg | Freq: Three times a day (TID) | INTRAMUSCULAR | Status: DC | PRN

## 2024-01-23 MED ORDER — DIPHENHYDRAMINE HCL 25 MG PO CAPS
50.0000 mg | ORAL_CAPSULE | Freq: Three times a day (TID) | ORAL | Status: DC | PRN
  Administered 2024-01-25: 50 mg via ORAL
  Filled 2024-01-23 (×2): qty 2

## 2024-01-23 MED ORDER — TRAZODONE HCL 50 MG PO TABS
50.0000 mg | ORAL_TABLET | Freq: Every evening | ORAL | Status: DC | PRN
  Administered 2024-01-23 – 2024-01-24 (×2): 50 mg via ORAL
  Filled 2024-01-23 (×2): qty 1

## 2024-01-23 MED ORDER — LORAZEPAM 2 MG/ML IJ SOLN
2.0000 mg | Freq: Three times a day (TID) | INTRAMUSCULAR | Status: DC | PRN
Start: 2024-01-23 — End: 2024-01-31

## 2024-01-23 NOTE — ED Notes (Signed)
 Pt ambulatory to bathroom with steady gait. Is very alert and carrying on conversation in hallway. No needs at this time.

## 2024-01-23 NOTE — ED Notes (Signed)
Pt took a shower and is resting comfortably

## 2024-01-23 NOTE — Group Note (Signed)
 Date:  01/23/2024 Time:  8:52 PM  Group Topic/Focus:  Personal Choices and Values:   The focus of this group is to help patients assess and explore the importance of values in their lives, how their values affect their decisions, how they express their values and what opposes their expression. Self Esteem Action Plan:   The focus of this group is to help patients create a plan to continue to build self-esteem after discharge.    PT did not attend group.  Donatello Kleve L 01/23/2024, 8:52 PM

## 2024-01-23 NOTE — ED Notes (Signed)
 IVC/Recommend inpatient psychiatric hospitalization

## 2024-01-23 NOTE — Plan of Care (Signed)
  Problem: Education: Goal: Emotional status will improve 01/23/2024 2256 by Autumn Deland KIDD, RN Outcome: Progressing 01/23/2024 2255 by Autumn Deland KIDD, RN Outcome: Progressing   Problem: Education: Goal: Knowledge of [facility] Education information/materials will improve 01/23/2024 2256 by Autumn Deland KIDD, RN Outcome: Progressing 01/23/2024 2255 by Autumn Deland KIDD, RN Outcome: Progressing Goal: Emotional status will improve 01/23/2024 2256 by Autumn Deland KIDD, RN Outcome: Progressing 01/23/2024 2255 by Autumn Deland KIDD, RN Outcome: Progressing Goal: Mental status will improve 01/23/2024 2256 by Autumn Deland KIDD, RN Outcome: Progressing 01/23/2024 2255 by Autumn Deland KIDD, RN Outcome: Progressing Goal: Verbalization of understanding the information provided will improve 01/23/2024 2256 by Autumn Deland KIDD, RN Outcome: Progressing 01/23/2024 2255 by Autumn Deland KIDD, RN Outcome: Progressing

## 2024-01-23 NOTE — ED Notes (Signed)
 IVC to go to BMU

## 2024-01-23 NOTE — ED Notes (Signed)
 Pt ambulatory to the bathroom with unsteady gait with this tech at her side. Pt stated to this tech, I have not been eating. This tech informed Sydni, Charity fundraiser. Pt given two vanilla ice creams at this time.

## 2024-01-23 NOTE — ED Notes (Signed)
 This tech received report and is sitting 1:1 with pt. Pt is resting comfortably at this time.

## 2024-01-23 NOTE — ED Notes (Signed)
 Pt ambulatory to the bathroom at this time with this tech at side.

## 2024-01-23 NOTE — Plan of Care (Signed)
  Problem: Education: Goal: Knowledge of [facility] Education information/materials will improve 01/23/2024 2256 by Autumn Deland KIDD, RN Outcome: Progressing 01/23/2024 2256 by Autumn Deland KIDD, RN Outcome: Progressing 01/23/2024 2255 by Autumn Deland KIDD, RN Outcome: Progressing Goal: Emotional status will improve 01/23/2024 2256 by Autumn Deland KIDD, RN Outcome: Progressing 01/23/2024 2256 by Autumn Deland KIDD, RN Outcome: Progressing 01/23/2024 2255 by Autumn Deland KIDD, RN Outcome: Progressing Goal: Mental status will improve 01/23/2024 2256 by Autumn Deland KIDD, RN Outcome: Progressing 01/23/2024 2256 by Autumn Deland KIDD, RN Outcome: Progressing 01/23/2024 2255 by Autumn Deland KIDD, RN Outcome: Progressing Goal: Verbalization of understanding the information provided will improve 01/23/2024 2256 by Autumn Deland KIDD, RN Outcome: Progressing 01/23/2024 2256 by Autumn Deland KIDD, RN Outcome: Progressing 01/23/2024 2255 by Autumn Deland KIDD, RN Outcome: Progressing

## 2024-01-23 NOTE — ED Notes (Signed)
 Report to [facility], RN in BMU unit

## 2024-01-23 NOTE — ED Notes (Signed)
 Pt is crying and upset at this time because she doesn't remember a lot from yesterday. Pt states she will comply and do whatever she has to do to be able to leave. No needs at this time.

## 2024-01-23 NOTE — Progress Notes (Signed)
   01/23/24 2236  Psych Admission Type (Psych Patients Only)  Admission Status Involuntary  Psychosocial Assessment  Patient Complaints Anxiety;Depression;Disorientation;Worrying  Eye Contact Brief  Facial Expression Anxious;Worried  Affect Anxious  Systems analyst;Soft  Interaction Minimal  Motor Activity Slow  Appearance/Hygiene In scrubs  Behavior Characteristics Cooperative;Anxious  Mood Anxious  Thought Process  Coherency Disorganized  Content Blaming others;Preoccupation  Delusions None reported or observed  Perception WDL  Hallucination None reported or observed  Judgment Impaired  Confusion None  Danger to Self  Current suicidal ideation? Denies  Danger to Others  Danger to Others None reported or observed   The patient presents with confusion surrounding the sequence of recent events. She reports ongoing interpersonal conflict with her daughter and son, which she describes as emotionally distressing. She reports that her daughter has been "toxic" and attempting to keep her 11 year old son away from her. Patient stated she took 7 Ativan  pills and 7 25mg  metoprolol  pills x 2 days.  Patient recalls posting "peace out" on Facebook on her birthday, but states it was meant as a casual message and not a suicidal gesture. However, family interpreted it differently and conducted a welfare check. During the wellness check, the patient appeared confused, reportedly stated her son was "lost," and exhibited behavior that led neighbors to believe she was disoriented.  She also reports falling--possibly during the episode--in or near the bathtub. She is unsure of the timing but has a visible bump on the left side of her forehead. She denies substance use and suicidal intent but reports significant emotional stress. Reports chronic abdominal/bladder pain, intermittent, severe enough to cause tears during episodes. Complained of forehead (pain rated 1-2/10) and refused pain relief  medication.

## 2024-01-24 ENCOUNTER — Encounter: Payer: Self-pay | Admitting: Psychiatry

## 2024-01-24 DIAGNOSIS — F3181 Bipolar II disorder: Secondary | ICD-10-CM

## 2024-01-24 LAB — URINALYSIS, ROUTINE W REFLEX MICROSCOPIC
Bilirubin Urine: NEGATIVE
Glucose, UA: NEGATIVE mg/dL
Ketones, ur: NEGATIVE mg/dL
Leukocytes,Ua: NEGATIVE
Nitrite: NEGATIVE
Protein, ur: >=300 mg/dL — AB
Specific Gravity, Urine: 1.019 (ref 1.005–1.030)
Squamous Epithelial / HPF: 0 /HPF (ref 0–5)
WBC, UA: >50 WBC/hpf (ref 0–5)
pH: 5 (ref 5.0–8.0)

## 2024-01-24 MED ORDER — ARIPIPRAZOLE 2 MG PO TABS
2.0000 mg | ORAL_TABLET | Freq: Every day | ORAL | Status: DC
  Administered 2024-01-25 – 2024-01-28 (×4): 2 mg via ORAL
  Filled 2024-01-24 (×4): qty 1

## 2024-01-24 MED ORDER — ESCITALOPRAM OXALATE 10 MG PO TABS
10.0000 mg | ORAL_TABLET | Freq: Every day | ORAL | Status: DC
  Administered 2024-01-25 – 2024-01-31 (×7): 10 mg via ORAL
  Filled 2024-01-24 (×7): qty 1

## 2024-01-24 NOTE — Group Note (Signed)
 Date:  01/24/2024 Time:  10:30 AM  Group Topic/Focus:  Goals Group:   The focus of this group is to help patients establish daily goals to achieve during treatment and discuss how the patient can incorporate goal setting into their daily lives to aide in recovery.    Participation Level:  Active  Participation Quality:  Appropriate  Affect:  Appropriate  Cognitive:  Appropriate  Insight: Appropriate  Engagement in Group:  Engaged  Modes of Intervention:  Discussion, Education, and Support  Additional Comments:    Deitra Caron Mainland 01/24/2024, 10:30 AM

## 2024-01-24 NOTE — H&P (Signed)
 Psychiatric Admission Assessment Adult  Patient Identification: Lisa Clay MRN:  [id number] Date of Evaluation:  01/24/2024 Chief Complaint:  Bipolar 2 disorder (HCC) [F31.81]   History of Present Illness:   Patient is a 53 year old female who presents under involuntary commitment (IVC) via local law enforcement for evaluation of suicidal ideation and agitation. Per IVC documentation and law enforcement report, patient allegedly went to a neighbor's residence requesting a firearm to kill herself. On arrival to the ED, she was markedly agitated and required IM prns for agitation.  During initial ED evaluation, patient was noted to be hostile, repeatedly requesting that [facility] PD be contacted, later stating her ex-husband is affiliated with [facility] PD. When questioned about the events leading to presentation, she initially reported that her son was not home and she went to her neighbor's house to obtain a key. She then gave inconsistent accounts, at one point saying she was at home and woke up unable to find her son, later stating her son had been gone since Friday. When confronted regarding the reported request for a gun, she became defensive and denied the allegation, and later indicates that she does not remember she does endorse writing a will however denies that it was a suicide note. She has poor insight into recent events. She was minimally engaged during the interview, demonstrated thought blocking, and appeared frustrated with the admission process. ED provider documented concern for possible bipolar disorder, though patient's limited participation precluded full diagnostic clarification. She was agreeable to increasing Lexapro  for depressive symptoms and initiating a low dose of Abilify  for mood stabilization. Notably, she sustained a fall in the ED; head CT was negative for acute findings. She expressed concern for UTI, urinalysis was ordered.   Total Time spent with  patient: 1 hour Sleep  Sleep:No data recorded Psychiatric History:  Information collected from patient   Prev Dx/Sx: Unknown Current Psych Provider: Denies Home Meds (current): Denies Previous Med Trials: Denies Therapy: Denies   Prior Psych Hospitalization: Denies Prior Self Harm: At age 24 Prior Violence: Denies   Family Psych History: Denies Family Hx suicide: Denies   Social History:   Educational Hx: Bachelor's degree in nursing Occupational Hx: Unemployed Legal Hx: Denies Living Situation: Lives with his 15 year old son Spiritual Hx: Denies Access to weapons/lethal means: Denies   Substance History Alcohol: Denies   Tobacco: Denies Illicit drugs: Denies Prescription drug abuse: Denies Rehab hx: Denies Is the patient at risk to self? Yes.    Has the patient been a risk to self in the past 6 months? unknown  Has the patient been a risk to self within the distant past? Yes.    Is the patient a risk to others? Yes.    Has the patient been a risk to others in the past 6 months? No.  Has the patient been a risk to others within the distant past? No.   [place] Scale:  Flowsheet Row Admission (Current) from 01/23/2024 in [facility] INPATIENT BEHAV[facility]ICINE ED from 01/22/2024 in [facility] Emergency Department at [facility] UC from 10/09/2023 in [facility] at [facility]   C-SSRS RISK CATEGORY High Risk High Risk No Risk     Past Medical History:  Past Medical History:  Diagnosis Date   Atrial tachycardia (HCC)    Dysrhythmia    Hx: PSVT. Current: Postural Orthostatic Tachycardia Syndrome, Inappropriate Sinus Node Tachycardia   Headache    migraines, rare.   Kidney stones    Palpitations  [facility] 2/12   Shortness of breath dyspnea    on exertion, secondary to heart issues   Sinus tachycardia    [facility] 2/12   Syncope    secondary to heart issues    Past Surgical History:  Procedure Laterality Date   BREAST BIOPSY Left 05/11/2013   stereo  biopsy   CARDIAC ELECTROPHYSIOLOGY STUDY AND ABLATION  04/24/11   [facility]   CLOSED MANIPULATION SHOULDER WITH STERIOD INJECTION Right 02/16/2015   Procedure: CLOSED MANIPULATION SHOULDER WITH STEROID INJECTION;  Surgeon: Norleen JINNY Maltos, MD;  Location: [facility];  Service: Orthopedics;  Laterality: Right;   FINGER SURGERY Right 12/25/2017   explorative surgery on R5th PIP    TONSILLECTOMY     Family History:  Family History  Problem Relation Age of Onset   Heart attack Maternal Grandmother 50       deceased   Hypertension Father        and high cholesterol    Hypertension Mother        and high cholesterol    Breast cancer Neg Hx     Social History:  Social History   Substance and Sexual Activity  Alcohol Use No   Comment: rare     Social History   Substance and Sexual Activity  Drug Use No      Allergies:   Allergies  Allergen Reactions   Erythromycin Nausea And Vomiting    Other Reaction(s): GI Intolerance, Other (See Comments)   Corticosteroids Hives and Rash   Lab Results:  Results for orders placed or performed during the hospital encounter of 01/23/24 (from the past 48 hours)  Urinalysis, Routine w reflex microscopic -Urine, Unspecified Source     Status: Abnormal   Collection Time: 01/24/24 11:30 AM  Result Value Ref Range   Color, Urine YELLOW (A) YELLOW   APPearance HAZY (A) CLEAR   Specific Gravity, Urine 1.019 1.005 - 1.030   pH 5.0 5.0 - 8.0   Glucose, UA NEGATIVE NEGATIVE mg/dL   Hgb urine dipstick SMALL (A) NEGATIVE   Bilirubin Urine NEGATIVE NEGATIVE   Ketones, ur NEGATIVE NEGATIVE mg/dL   Protein, ur >=699 (A) NEGATIVE mg/dL   Nitrite NEGATIVE NEGATIVE   Leukocytes,Ua NEGATIVE NEGATIVE   RBC / HPF 21-50 0 - 5 RBC/hpf   WBC, UA >50 0 - 5 WBC/hpf   Bacteria, UA RARE (A) NONE SEEN   Squamous Epithelial / HPF 0 0 - 5 /HPF   Mucus PRESENT     Comment: Performed at [facility], [address] [place], [place]  [address]    Blood Alcohol level:  Lab Results  Component Value Date   [facility] <15 01/22/2024    Metabolic Disorder Labs:  No results found for: HGBA1C, MPG No results found for: PROLACTIN No results found for: CHOL, TRIG, HDL, CHOLHDL, VLDL, LDLCALC  Current Medications: Current Facility-Administered Medications  Medication Dose Route Frequency Provider Last Rate Last Admin   acetaminophen  (TYLENOL ) tablet 650 mg  650 mg Oral Q6H PRN Donnelly Mellow, MD       alum & mag hydroxide-simeth (MAALOX/MYLANTA) 200-200-20 MG/5ML suspension 30 mL  30 mL Oral Q4H PRN Jadapalle, Sree, MD       atorvastatin  (LIPITOR) tablet 40 mg  40 mg Oral QHS Jadapalle, Sree, MD   40 mg at 01/23/24 2300   conjugated estrogens  (PREMARIN ) vaginal cream 1 Applicatorful  1 Applicatorful Vaginal Q M,W,F Jadapalle, Sree, MD       cyanocobalamin  (VITAMIN  B12) tablet 1,000 mcg  1,000 mcg Oral Daily Jadapalle, Sree, MD   1,000 mcg at 01/24/24 9171   haloperidol  (HALDOL ) tablet 5 mg  5 mg Oral TID PRN Jadapalle, Sree, MD       And   diphenhydrAMINE  (BENADRYL ) capsule 50 mg  50 mg Oral TID PRN Jadapalle, Sree, MD       haloperidol  lactate (HALDOL ) injection 5 mg  5 mg Intramuscular TID PRN Jadapalle, Sree, MD       And   diphenhydrAMINE  (BENADRYL ) injection 50 mg  50 mg Intramuscular TID PRN Jadapalle, Sree, MD       And   LORazepam  (ATIVAN ) injection 2 mg  2 mg Intramuscular TID PRN Jadapalle, Sree, MD       haloperidol  lactate (HALDOL ) injection 10 mg  10 mg Intramuscular TID PRN Jadapalle, Sree, MD       And   diphenhydrAMINE  (BENADRYL ) injection 50 mg  50 mg Intramuscular TID PRN Jadapalle, Sree, MD       And   LORazepam  (ATIVAN ) injection 2 mg  2 mg Intramuscular TID PRN Jadapalle, Sree, MD       escitalopram  (LEXAPRO ) tablet 5 mg  5 mg Oral Daily Jadapalle, Sree, MD   5 mg at 01/24/24 9171   fludrocortisone  (FLORINEF ) tablet 100 mcg  100 mcg Oral Daily Jadapalle, Sree, MD   100 mcg at 01/24/24 9171    folic acid  (FOLVITE ) tablet 1 mg  1 mg Oral Daily Jadapalle, Sree, MD   1 mg at 01/24/24 9170   gabapentin  (NEURONTIN ) capsule 1,200 mg  1,200 mg Oral QHS Jadapalle, Sree, MD   1,200 mg at 01/23/24 2300   hydrOXYzine  (ATARAX ) tablet 25 mg  25 mg Oral Q6H PRN Jadapalle, Sree, MD   25 mg at 01/24/24 9147   indomethacin  (INDOCIN ) capsule 50 mg  50 mg Oral BID WC Jadapalle, Sree, MD   50 mg at 01/24/24 9171   ivabradine  (CORLANOR ) tablet 5 mg  5 mg Oral BID Jadapalle, Sree, MD   5 mg at 01/24/24 9171   magnesium  hydroxide (MILK OF MAGNESIA) suspension 30 mL  30 mL Oral Daily PRN Donnelly Mellow, MD       methenamine  (MANDELAMINE) tablet 1,000 mg  1,000 mg Oral QID Jadapalle, Sree, MD   1,000 mg at 01/23/24 2315   methotrexate  (RHEUMATREX) tablet 15 mg  15 mg Oral Q Fri Jadapalle, Sree, MD   15 mg at 01/24/24 0830   metoprolol  succinate (TOPROL -XL) 24 hr tablet 12.5 mg  12.5 mg Oral Daily Jadapalle, Sree, MD   12.5 mg at 01/24/24 9158   nortriptyline  (PAMELOR ) capsule 10 mg  10 mg Oral QHS Jadapalle, Sree, MD       topiramate  (TOPAMAX ) tablet 50 mg  50 mg Oral TID Jadapalle, Sree, MD   50 mg at 01/24/24 1226   traZODone  (DESYREL ) tablet 50 mg  50 mg Oral QHS PRN Jadapalle, Sree, MD   50 mg at 01/23/24 2302   PTA Medications: Medications Prior to Admission  Medication Sig Dispense Refill Last Dose/Taking   atorvastatin  (LIPITOR) 40 MG tablet Take 1 tablet by mouth daily.      conjugated estrogens  (PREMARIN ) vaginal cream Discard applicator and Apply pea sized amount to tip of finger to urethra before bed. Wash hands well after application. Use Monday, Wednesday and Friday 30 g 12    CORLANOR  5 MG TABS tablet Take 5 mg by mouth 2 (two) times daily.  5    COSENTYX  SENSOREADY PEN 150 MG/ML SOAJ Inject into the skin every 28 (twenty-eight) days.      cyanocobalamin  (VITAMIN B12) 1000 MCG tablet Take 1,000 mcg by mouth daily.      diazepam (VALIUM) 5 MG tablet Insert one tablet (5mg ) into vagina as  needed for pelvic pain every 12 hours.      escitalopram  (LEXAPRO ) 5 MG tablet Take 1 tablet by mouth daily.      fludrocortisone  (FLORINEF ) 0.1 MG tablet Take 100 mcg by mouth daily.  3    folic acid  (FOLVITE ) 1 MG tablet Take 1 tablet by mouth daily.      gabapentin  (NEURONTIN ) 400 MG capsule Take 1,200 mg by mouth at bedtime.      indomethacin  (INDOCIN ) 50 MG capsule Take 50 mg by mouth 2 (two) times daily with a meal.      methenamine  (HIPREX ) 1 g tablet Take 1 g by mouth.      methotrexate  (RHEUMATREX) 2.5 MG tablet Take 15 mg by mouth.      metoprolol  succinate (TOPROL -XL) 25 MG 24 hr tablet Take 12.5 mg by mouth daily.      nortriptyline  (PAMELOR ) 10 MG capsule Take 10 mg by mouth.      topiramate  (TOPAMAX ) 50 MG tablet Take 50 mg by mouth 3 (three) times daily.       Psychiatric Specialty Exam:  Presentation  General Appearance: No data recorded Eye Contact:No data recorded Speech:No data recorded Speech Volume:No data recorded   Mood and Affect  Mood:No data recorded Affect:No data recorded  Thought Process  Thought Processes:No data recorded Descriptions of Associations:No data recorded Orientation:No data recorded Thought Content:No data recorded Hallucinations:No data recorded Ideas of Reference:No data recorded Suicidal Thoughts:No data recorded Homicidal Thoughts:No data recorded  Sensorium  Memory:No data recorded Judgment:No data recorded Insight:No data recorded  Executive Functions  Concentration:No data recorded Attention Span:No data recorded Recall:No data recorded Fund of Knowledge:No data recorded Language:No data recorded  Psychomotor Activity  Psychomotor Activity:No data recorded  Assets  Assets:No data recorded   Musculoskeletal: Strength & Muscle Tone: within normal limits Gait & Station: normal  Physical Exam: Physical Exam Vitals and nursing note reviewed.  HENT:     Head: Atraumatic.  Eyes:     Extraocular Movements:  Extraocular movements intact.  Pulmonary:     Effort: Pulmonary effort is normal.  Neurological:     Mental Status: She is alert.    Review of Systems  Psychiatric/Behavioral:  Positive for depression and suicidal ideas. The patient is nervous/anxious.    Blood pressure 123/71, pulse 64, temperature (!) 97.2 F (36.2 C), temperature source Oral, resp. rate 18, height 5' 6 (1.676 m), weight 56.9 kg, SpO2 100%. Body mass index is 20.26 kg/m.  Principal Diagnosis: Bipolar 2 disorder (HCC) Diagnosis:  Principal Problem:   Bipolar 2 disorder (HCC)   Clinical Decision Making:  53 year old female with inconsistent history, recent suicidal statements, and documented request for a firearm in the context of IVC for suicidal ideation and agitation. Clinical presentation is notable for poor engagement, thought blocking, hostility, and affective lability, raising concern for mood disorder with possible bipolar spectrum features versus major depressive disorder with mixed features. Limited reliability of self-report complicates assessment. Safety concerns remain elevated given recent suicidal statements, written will, and behaviors suggestive of intent to access lethal means, despite her denial of ongoing suicidal ideation during interview. No evidence of acute psychosis was elicited, though guardedness and limited participation may obscure symptom detection. At present,  presentation warrants continued inpatient psychiatric admission for safety, diagnostic clarification, medication management, and stabilization.    Treatment Plan Summary:  Safety and Monitoring:             -- Voluntary admission to inpatient psychiatric unit for safety, stabilization and treatment             -- Daily contact with patient to assess and evaluate symptoms and progress in treatment             -- Patient's case to be discussed in multi-disciplinary team meeting             -- Observation Level: q15 minute checks              -- Vital signs:  q12 hours             -- Precautions: suicide, elopement, and assault   2. Psychiatric Diagnoses and Treatment:       Unspecified mood disorder:         - Start Abilify  2 mg daily -Increase Lexapro  to 10 mg daily - Continue nortriptyline  for sleep 10 mg at bedtime - Continue gabapentin  1200 mg nightly which patient reports for sleep and anxiety   -- The risks/benefits/side-effects/alternatives to this medication were discussed in detail with the patient and time was given for questions. The patient consents to medication trial.                -- Metabolic profile and EKG monitoring obtained while on an atypical antipsychotic (BMI: Lipid Panel: HbgA1c: QTc:)              -- Encouraged patient to participate in unit milieu and in scheduled group therapies                            3. Medical Issues Being Addressed:   home meds reordered as such Atorvastatin  40 mg nightly Premarin  1 application Monday Wednesday Friday Vitamin B12 1000 mcg daily Florinef  100 mcg daily Indomethacin  50 mg twice daily Corlanor  5 mg twice daily Methenamine  1000 mg 4 times daily Metoprolol  12.5 mg daily Methotrexate  15 mg every Friday Topamax  50 mg 3 times daily   4. Discharge Planning:              -- Social work and case management to assist with discharge planning and identification of hospital follow-up needs prior to discharge             -- Estimated LOS: 5-7 days             -- Discharge Concerns: Need to establish a safety plan; Medication compliance and effectiveness             -- Discharge Goals: Return home with outpatient referrals follow ups  Physician Treatment Plan for Primary Diagnosis: Bipolar 2 disorder (HCC) Long Term Goal(s): Improvement in symptoms so as ready for discharge  Short Term Goals: Ability to maintain clinical measurements within normal limits will improve, Compliance with prescribed medications will improve, and Ability to identify triggers  associated with substance abuse/mental health issues will improve  I certify that inpatient services furnished can reasonably be expected to improve the patient's condition.    Donnice FORBES Right, PA-C 8/15/20252:25 PM

## 2024-01-24 NOTE — Progress Notes (Signed)
   01/24/24 0828  Psychosocial Assessment  Patient Complaints Anxiety;Depression;Disorientation  Eye Contact Brief  Facial Expression Anxious;Worried  Affect Anxious  Systems analyst;Soft  Interaction Minimal  Motor Activity Slow  Appearance/Hygiene In scrubs;Unremarkable  Behavior Characteristics Cooperative;Appropriate to situation;Anxious  Mood Anxious  Aggressive Behavior  Effect No apparent injury  Thought Process  Coherency Disorganized  Content Blaming others;Preoccupation  Delusions None reported or observed  Perception WDL  Hallucination None reported or observed  Judgment Impaired  Confusion None  Danger to Self  Current suicidal ideation?  (Denies)  Description of Suicide Plan no plan  Agreement Not to Harm Self Yes  Description of Agreement verbal  Danger to Others  Danger to Others None reported or observed

## 2024-01-24 NOTE — Plan of Care (Signed)
   Problem: Education: Goal: Knowledge of [facility] Education information/materials will improve Outcome: Progressing Goal: Emotional status will improve Outcome: Progressing Goal: Mental status will improve Outcome: Progressing Goal: Verbalization of understanding the information provided will improve Outcome: Progressing

## 2024-01-24 NOTE — [facility]HH Suicide Risk Assessment (Signed)
 [facility] Admission Suicide Risk Assessment   Nursing information obtained from:  Patient Demographic factors:  Caucasian, Unemployed Current Mental Status:  NA Loss Factors:  NA Historical Factors:  Prior suicide attempts, Impulsivity Risk Reduction Factors:  Responsible for children under 53 years of age  Total Time spent with patient: 1 hour Principal Problem: Bipolar 2 disorder (HCC) Diagnosis:  Principal Problem:   Bipolar 2 disorder (HCC)  Subjective Data:   53 year old female with inconsistent history, recent suicidal statements, and documented request for a firearm in the context of IVC for suicidal ideation and agitation. Clinical presentation is notable for poor engagement, thought blocking, hostility, and affective lability, raising concern for mood disorder with possible bipolar spectrum features versus major depressive disorder with mixed features. Limited reliability of self-report complicates assessment. Safety concerns remain elevated given recent suicidal statements, written will, and behaviors suggestive of intent to access lethal means, despite her denial of ongoing suicidal ideation during interview. No evidence of acute psychosis was elicited, though guardedness and limited participation may obscure symptom detection. At present, presentation warrants continued inpatient psychiatric admission for safety, diagnostic clarification, medication management, and stabilization.    Continued Clinical Symptoms:  Alcohol Use Disorder Identification Test Final Score (AUDIT): 0 The Alcohol Use Disorders Identification Test, Guidelines for Use in Primary Care, Second Edition.  World Science writer [facility]). Score between 0-7:  no or low risk or alcohol related problems. Score between 8-15:  moderate risk of alcohol related problems. Score between 16-19:  high risk of alcohol related problems. Score 20 or above:  warrants further diagnostic evaluation for alcohol dependence and  treatment.   CLINICAL FACTORS:   Depression:   Impulsivity Medical Diagnoses and Treatments/Surgeries   Musculoskeletal: Strength & Muscle Tone: within normal limits Gait & Station: normal Patient leans: N/A  Psychiatric Specialty Exam:  Presentation  General Appearance:  Casual  Eye Contact: Fleeting  Speech: Clear and Coherent  Speech Volume: Decreased  Handedness:No data recorded  Mood and Affect  Mood: Anxious  Affect: Blunt   Thought Process  Thought Processes:No data recorded Descriptions of Associations:Circumstantial  Orientation:Full (Time, Place and Person)  Thought Content:Other (comment) (thought blocking)  History of Schizophrenia/Schizoaffective disorder:No data recorded Duration of Psychotic Symptoms:No data recorded Hallucinations:Hallucinations: None  Ideas of Reference:None  Suicidal Thoughts:Suicidal Thoughts: No  Homicidal Thoughts:Homicidal Thoughts: No   Sensorium  Memory: Immediate Poor  Judgment: Poor  Insight: Poor   Executive Functions  Concentration:No data recorded Attention Span:No data recorded Recall:No data recorded Fund of Knowledge:No data recorded Language:No data recorded  Psychomotor Activity  Psychomotor Activity: Psychomotor Activity: Normal   Assets  Assets: Housing   Sleep  Sleep: Sleep: Fair    Physical Exam: Physical Exam ROS Blood pressure 123/71, pulse 64, temperature (!) 97.2 F (36.2 C), temperature source Oral, resp. rate 18, height 5' 6 (1.676 m), weight 56.9 kg, SpO2 100%. Body mass index is 20.26 kg/m.   COGNITIVE FEATURES THAT CONTRIBUTE TO RISK:  Thought constriction (tunnel vision)    SUICIDE RISK:   Severe:  Frequent, intense, and enduring suicidal ideation, specific plan, no subjective intent, but some objective markers of intent (i.e., choice of lethal method), the method is accessible, some limited preparatory behavior, evidence of impaired self-control,  severe dysphoria/symptomatology, multiple risk factors present, and few if any protective factors, particularly a lack of social support.  PLAN OF CARE:   Safety and Monitoring:   --  involuntary admission to inpatient psychiatric unit for safety, stabilization and treatment -- Daily contact  with patient to assess and evaluate symptoms and progress in treatment -- Patient's case to be discussed in multi-disciplinary team meeting -- Observation Level : q15 minute checks -- Vital signs:  q12 hours -- Precautions: suicide   I certify that inpatient services furnished can reasonably be expected to improve the patient's condition.   Donnice FORBES Right, PA-C 01/24/2024, 4:43 PM
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
## 2024-01-24 NOTE — Group Note (Signed)
 Date:  01/24/2024 Time:  11:21 PM  Group Topic/Focus:  Goals Group:   The focus of this group is to help patients establish daily goals to achieve during treatment and discuss how the patient can incorporate goal setting into their daily lives to aide in recovery. Personal Choices and Values:   The focus of this group is to help patients assess and explore the importance of values in their lives, how their values affect their decisions, how they express their values and what opposes their expression. Self Esteem Action Plan:   The focus of this group is to help patients create a plan to continue to build self-esteem after discharge.    Participation Level:  Active  Participation Quality:  Appropriate and Attentive  Affect:  Appropriate  Cognitive:  Alert, Appropriate, and Oriented  Insight: Appropriate and Good  Engagement in Group:  Engaged  Modes of Intervention:  Discussion and Support  Additional Comments:  N/A  Butler LITTIE Gelineau 01/24/2024, 11:21 PM

## 2024-01-24 NOTE — BH IP Treatment Plan (Signed)
 Interdisciplinary Treatment and Diagnostic Plan Update  01/24/2024 Time of Session: 11:05 AM Lisa Clay MRN: [id number]  Principal Diagnosis: Bipolar 2 disorder (HCC)  Secondary Diagnoses: Principal Problem:   Bipolar 2 disorder (HCC)   Current Medications:  Current Facility-Administered Medications  Medication Dose Route Frequency Provider Last Rate Last Admin   acetaminophen  (TYLENOL ) tablet 650 mg  650 mg Oral Q6H PRN Jadapalle, Sree, MD       alum & mag hydroxide-simeth (MAALOX/MYLANTA) 200-200-20 MG/5ML suspension 30 mL  30 mL Oral Q4H PRN Jadapalle, Sree, MD       atorvastatin  (LIPITOR) tablet 40 mg  40 mg Oral QHS Jadapalle, Sree, MD   40 mg at 01/23/24 2300   conjugated estrogens  (PREMARIN ) vaginal cream 1 Applicatorful  1 Applicatorful Vaginal Q M,W,F Jadapalle, Sree, MD       cyanocobalamin  (VITAMIN B12) tablet 1,000 mcg  1,000 mcg Oral Daily Jadapalle, Sree, MD   1,000 mcg at 01/24/24 9171   haloperidol  (HALDOL ) tablet 5 mg  5 mg Oral TID PRN Jadapalle, Sree, MD       And   diphenhydrAMINE  (BENADRYL ) capsule 50 mg  50 mg Oral TID PRN Jadapalle, Sree, MD       haloperidol  lactate (HALDOL ) injection 5 mg  5 mg Intramuscular TID PRN Jadapalle, Sree, MD       And   diphenhydrAMINE  (BENADRYL ) injection 50 mg  50 mg Intramuscular TID PRN Jadapalle, Sree, MD       And   LORazepam  (ATIVAN ) injection 2 mg  2 mg Intramuscular TID PRN Jadapalle, Sree, MD       haloperidol  lactate (HALDOL ) injection 10 mg  10 mg Intramuscular TID PRN Jadapalle, Sree, MD       And   diphenhydrAMINE  (BENADRYL ) injection 50 mg  50 mg Intramuscular TID PRN Jadapalle, Sree, MD       And   LORazepam  (ATIVAN ) injection 2 mg  2 mg Intramuscular TID PRN Jadapalle, Sree, MD       escitalopram  (LEXAPRO ) tablet 5 mg  5 mg Oral Daily Jadapalle, Sree, MD   5 mg at 01/24/24 9171   fludrocortisone  (FLORINEF ) tablet 100 mcg  100 mcg Oral Daily Jadapalle, Sree, MD   100 mcg at 01/24/24 9171   folic acid   (FOLVITE ) tablet 1 mg  1 mg Oral Daily Jadapalle, Sree, MD   1 mg at 01/24/24 9170   gabapentin  (NEURONTIN ) capsule 1,200 mg  1,200 mg Oral QHS Jadapalle, Sree, MD   1,200 mg at 01/23/24 2300   hydrOXYzine  (ATARAX ) tablet 25 mg  25 mg Oral Q6H PRN Jadapalle, Sree, MD   25 mg at 01/24/24 9147   indomethacin  (INDOCIN ) capsule 50 mg  50 mg Oral BID WC Jadapalle, Sree, MD   50 mg at 01/24/24 9171   ivabradine  (CORLANOR ) tablet 5 mg  5 mg Oral BID Jadapalle, Sree, MD   5 mg at 01/24/24 9171   magnesium  hydroxide (MILK OF MAGNESIA) suspension 30 mL  30 mL Oral Daily PRN Donnelly Mellow, MD       methenamine  (MANDELAMINE) tablet 1,000 mg  1,000 mg Oral QID Jadapalle, Sree, MD   1,000 mg at 01/23/24 2315   methotrexate  (RHEUMATREX) tablet 15 mg  15 mg Oral Q Fri Jadapalle, Sree, MD   15 mg at 01/24/24 0830   metoprolol  succinate (TOPROL -XL) 24 hr tablet 12.5 mg  12.5 mg Oral Daily Jadapalle, Sree, MD   12.5 mg at 01/24/24 0841   nortriptyline  (PAMELOR ) capsule 10  mg  10 mg Oral QHS Jadapalle, Sree, MD       topiramate  (TOPAMAX ) tablet 50 mg  50 mg Oral TID Jadapalle, Sree, MD   50 mg at 01/24/24 9171   traZODone  (DESYREL ) tablet 50 mg  50 mg Oral QHS PRN Jadapalle, Sree, MD   50 mg at 01/23/24 2302   PTA Medications: Medications Prior to Admission  Medication Sig Dispense Refill Last Dose/Taking   atorvastatin  (LIPITOR) 40 MG tablet Take 1 tablet by mouth daily.      conjugated estrogens  (PREMARIN ) vaginal cream Discard applicator and Apply pea sized amount to tip of finger to urethra before bed. Wash hands well after application. Use Monday, Wednesday and Friday 30 g 12    CORLANOR  5 MG TABS tablet Take 5 mg by mouth 2 (two) times daily.  5    COSENTYX SENSOREADY PEN 150 MG/ML SOAJ Inject into the skin every 28 (twenty-eight) days.      cyanocobalamin  (VITAMIN B12) 1000 MCG tablet Take 1,000 mcg by mouth daily.      diazepam (VALIUM) 5 MG tablet Insert one tablet (5mg ) into vagina as needed for pelvic  pain every 12 hours.      escitalopram  (LEXAPRO ) 5 MG tablet Take 1 tablet by mouth daily.      fludrocortisone  (FLORINEF ) 0.1 MG tablet Take 100 mcg by mouth daily.  3    folic acid  (FOLVITE ) 1 MG tablet Take 1 tablet by mouth daily.      gabapentin  (NEURONTIN ) 400 MG capsule Take 1,200 mg by mouth at bedtime.      indomethacin  (INDOCIN ) 50 MG capsule Take 50 mg by mouth 2 (two) times daily with a meal.      methenamine  (HIPREX ) 1 g tablet Take 1 g by mouth.      methotrexate  (RHEUMATREX) 2.5 MG tablet Take 15 mg by mouth.      metoprolol  succinate (TOPROL -XL) 25 MG 24 hr tablet Take 12.5 mg by mouth daily.      nortriptyline  (PAMELOR ) 10 MG capsule Take 10 mg by mouth.      topiramate  (TOPAMAX ) 50 MG tablet Take 50 mg by mouth 3 (three) times daily.       Patient Stressors:    Patient Strengths:    Treatment Modalities: Medication Management, Group therapy, Case management,  1 to 1 session with clinician, Psychoeducation, Recreational therapy.   Physician Treatment Plan for Primary Diagnosis: Bipolar 2 disorder (HCC) Long Term Goal(s):     Short Term Goals:    Medication Management: Evaluate patient's response, side effects, and tolerance of medication regimen.  Therapeutic Interventions: 1 to 1 sessions, Unit Group sessions and Medication administration.  Evaluation of Outcomes: Not Met  Physician Treatment Plan for Secondary Diagnosis: Principal Problem:   Bipolar 2 disorder (HCC)  Long Term Goal(s):     Short Term Goals:       Medication Management: Evaluate patient's response, side effects, and tolerance of medication regimen.  Therapeutic Interventions: 1 to 1 sessions, Unit Group sessions and Medication administration.  Evaluation of Outcomes: Not Met   RN Treatment Plan for Primary Diagnosis: Bipolar 2 disorder (HCC) Long Term Goal(s): Knowledge of disease and therapeutic regimen to maintain health will improve  Short Term Goals: Ability to verbalize  frustration and anger appropriately will improve, Ability to demonstrate self-control, Ability to participate in decision making will improve, Ability to verbalize feelings will improve, Ability to disclose and discuss suicidal ideas, and Ability to identify and develop effective coping behaviors  will improve  Medication Management: RN will administer medications as ordered by provider, will assess and evaluate patient's response and provide education to patient for prescribed medication. RN will report any adverse and/or side effects to prescribing provider.  Therapeutic Interventions: 1 on 1 counseling sessions, Psychoeducation, Medication administration, Evaluate responses to treatment, Monitor vital signs and CBGs as ordered, Perform/monitor CIWA, COWS, AIMS and Fall Risk screenings as ordered, Perform wound care treatments as ordered.  Evaluation of Outcomes: Not Met   LCSW Treatment Plan for Primary Diagnosis: Bipolar 2 disorder (HCC) Long Term Goal(s): Safe transition to appropriate next level of care at discharge, Engage patient in therapeutic group addressing interpersonal concerns.  Short Term Goals: Engage patient in aftercare planning with referrals and resources, Increase social support, Increase ability to appropriately verbalize feelings, Increase emotional regulation, Facilitate acceptance of mental health diagnosis and concerns, Facilitate patient progression through stages of change regarding substance use diagnoses and concerns, Identify triggers associated with mental health/substance abuse issues, and Increase skills for wellness and recovery  Therapeutic Interventions: Assess for all discharge needs, 1 to 1 time with Social worker, Explore available resources and support systems, Assess for adequacy in community support network, Educate family and significant other(s) on suicide prevention, Complete Psychosocial Assessment, Interpersonal group therapy.  Evaluation of Outcomes:  Not Met   Progress in Treatment: Attending groups: Yes. Participating in groups: Yes. Taking medication as prescribed: Yes. Toleration medication: Yes. Family/Significant other contact made: No, will contact:  CSW to contact once permission is granted.  Patient understands diagnosis: Yes. and No. Discussing patient identified problems/goals with staff: Yes. and No. Medical problems stabilized or resolved: Yes. Denies suicidal/homicidal ideation: Yes. Issues/concerns per patient self-inventory: No. Other: None  New problem(s) identified: No, Describe:  None  New Short Term/Long Term Goal(s):detox, elimination of symptoms of psychosis, medication management for mood stabilization; elimination of SI thoughts; development of comprehensive mental wellness/sobriety plan.    Patient Goals:  I don't know.  Discharge Plan or Barriers: CSW to assist with the development of appropriate discharge plan.    Reason for Continuation of Hospitalization: Anxiety Delusions  Depression Mania Suicidal ideation  Estimated Length of Stay: 1-7 days.   Last 3 [place] Suicide Severity Risk Score: Flowsheet Row Admission (Current) from 01/23/2024 in [facility]PATIENT BEH[facility]ICINE ED from 01/22/2024 in [facility] at [facility] from 10/09/2023 in [facility] at [facility] RISK CATEGORY High Risk High Risk No Risk    Last PHQ 2/9 Scores:     No data to display          Scribe for Treatment Team: Maleka Contino M Airlie Blumenberg, LCSW 01/24/2024 12:00 PM

## 2024-01-24 NOTE — Progress Notes (Signed)
   01/24/24 2100  Psych Admission Type (Psych Patients Only)  Admission Status Involuntary  Psychosocial Assessment  Patient Complaints Anxiety;Depression  Eye Contact Fair  Facial Expression Anxious  Affect Anxious  Speech Logical/coherent  Interaction Minimal  Motor Activity Slow  Appearance/Hygiene Unremarkable  Behavior Characteristics Cooperative  Mood Anxious  Thought Process  Coherency Disorganized  Content Preoccupation  Delusions None reported or observed  Perception WDL  Hallucination None reported or observed  Judgment Impaired  Confusion None  Danger to Self  Current suicidal ideation? Denies  Danger to Others  Danger to Others None reported or observed

## 2024-01-24 NOTE — Group Note (Signed)
 Recreation Therapy Group Note   Group Topic:Leisure Education  Group Date: 01/24/2024 Start Time: 1530 End Time: 1635 Facilitators: Celestia Jeoffrey FORBES ARTICE, CTRS Location: Craft Room  Group Description: Leisure. Patients were given the option to choose from singing karaoke, coloring mandalas, using oil pastels, journaling, painting or playing with play-doh. LRT and pts discussed the meaning of leisure, the importance of participating in leisure during their free time/when they're outside of the hospital, as well as how our leisure interests can also serve as coping skills.   Goal Area(s) Addressed:  Patient will identify a current leisure interest.  Patient will learn the definition of "leisure". Patient will practice making a positive decision. Patient will have the opportunity to try a new leisure activity. Patient will communicate with peers and LRT.    Affect/Mood: Appropriate   Participation Level: Active and Engaged   Participation Quality: Independent   Behavior: Appropriate, Calm, and Cooperative   Speech/Thought Process: Coherent   Insight: Good   Judgement: Good   Modes of Intervention: Education, Exploration, and Music   Patient Response to Interventions:  Attentive, Engaged, Interested , and Receptive   Education Outcome:  Acknowledges education   Clinical Observations/Individualized Feedback: Lisa Clay was active in their participation of session activities and group discussion. Pt identified decorate cookies and walk as things she does in her free time. Pt chose to color while in group.    Plan: Continue to engage patient in RT group sessions 2-3x/week.   Jeoffrey FORBES Celestia, LRT, CTRS 01/24/2024 5:11 PM

## 2024-01-24 NOTE — Plan of Care (Signed)
 Problem: Education: Goal: Knowledge of [facility] Education information/materials will improve 01/24/2024 1817 by Shirley Jon FALCON, RN Outcome: Progressing 01/24/2024 1735 by Shirley Jon FALCON, RN Outcome: Progressing Goal: Emotional status will improve 01/24/2024 1817 by Shirley Jon FALCON, RN Outcome: Progressing 01/24/2024 1735 by Shirley Jon FALCON, RN Outcome: Progressing Goal: Mental status will improve 01/24/2024 1817 by Shirley Jon FALCON, RN Outcome: Progressing 01/24/2024 1735 by Shirley Jon FALCON, RN Outcome: Progressing Goal: Verbalization of understanding the information provided will improve 01/24/2024 1817 by Shirley Jon FALCON, RN Outcome: Progressing 01/24/2024 1735 by Shirley Jon FALCON, RN Outcome: Progressing   Problem: Activity: Goal: Interest or engagement in activities will improve 01/24/2024 1817 by Shirley Jon FALCON, RN Outcome: Progressing 01/24/2024 1735 by Shirley Jon FALCON, RN Outcome: Progressing Goal: Sleeping patterns will improve 01/24/2024 1817 by Shirley Jon FALCON, RN Outcome: Progressing 01/24/2024 1735 by Shirley Jon FALCON, RN Outcome: Progressing   Problem: Coping: Goal: Ability to verbalize frustrations and anger appropriately will improve 01/24/2024 1817 by Shirley Jon FALCON, RN Outcome: Progressing 01/24/2024 1735 by Shirley Jon FALCON, RN Outcome: Progressing Goal: Ability to demonstrate self-control will improve 01/24/2024 1817 by Shirley Jon FALCON, RN Outcome: Progressing 01/24/2024 1735 by Shirley Jon FALCON, RN Outcome: Progressing   Problem: Health Behavior/Discharge Planning: Goal: Identification of resources available to assist in meeting health care needs will improve 01/24/2024 1817 by Shirley Jon FALCON, RN Outcome: Progressing 01/24/2024 1735 by Shirley Jon FALCON, RN Outcome: Progressing Goal: Compliance with treatment plan for underlying cause of condition will improve 01/24/2024 1817 by Shirley Jon FALCON, RN Outcome:  Progressing 01/24/2024 1735 by Shirley Jon FALCON, RN Outcome: Progressing   Problem: Physical Regulation: Goal: Ability to maintain clinical measurements within normal limits will improve 01/24/2024 1817 by Shirley Jon FALCON, RN Outcome: Progressing 01/24/2024 1735 by Shirley Jon FALCON, RN Outcome: Progressing   Problem: Safety: Goal: Periods of time without injury will increase 01/24/2024 1817 by Shirley Jon FALCON, RN Outcome: Progressing 01/24/2024 1735 by Shirley Jon FALCON, RN Outcome: Progressing   Problem: Education: Goal: Ability to state activities that reduce stress will improve 01/24/2024 1817 by Shirley Jon FALCON, RN Outcome: Progressing 01/24/2024 1735 by Shirley Jon FALCON, RN Outcome: Progressing   Problem: Coping: Goal: Ability to identify and develop effective coping behavior will improve 01/24/2024 1817 by Shirley Jon FALCON, RN Outcome: Progressing 01/24/2024 1735 by Shirley Jon FALCON, RN Outcome: Progressing   Problem: Self-Concept: Goal: Ability to identify factors that promote anxiety will improve 01/24/2024 1817 by Shirley Jon FALCON, RN Outcome: Progressing 01/24/2024 1735 by Shirley Jon FALCON, RN Outcome: Progressing Goal: Level of anxiety will decrease 01/24/2024 1817 by Shirley Jon FALCON, RN Outcome: Progressing 01/24/2024 1735 by Shirley Jon FALCON, RN Outcome: Progressing Goal: Ability to modify response to factors that promote anxiety will improve 01/24/2024 1817 by Shirley Jon FALCON, RN Outcome: Progressing 01/24/2024 1735 by Shirley Jon FALCON, RN Outcome: Progressing   Problem: Education: Goal: Utilization of techniques to improve thought processes will improve 01/24/2024 1817 by Shirley Jon FALCON, RN Outcome: Progressing 01/24/2024 1735 by Shirley Jon FALCON, RN Outcome: Progressing Goal: Knowledge of the prescribed therapeutic regimen will improve 01/24/2024 1817 by Shirley Jon FALCON, RN Outcome: Progressing 01/24/2024 1735 by Shirley Jon FALCON,  RN Outcome: Progressing   Problem: Activity: Goal: Interest or engagement in leisure activities will improve 01/24/2024 1817 by Shirley Jon FALCON, RN Outcome: Progressing 01/24/2024 1735 by Shirley Jon FALCON, RN Outcome: Progressing Goal: Imbalance in normal sleep/wake cycle will improve 01/24/2024 1817 by Shirley,  Jon FALCON, RN Outcome: Progressing 01/24/2024 1735 by Shirley Jon FALCON, RN Outcome: Progressing   Problem: Coping: Goal: Coping ability will improve 01/24/2024 1817 by Shirley Jon FALCON, RN Outcome: Progressing 01/24/2024 1735 by Shirley Jon FALCON, RN Outcome: Progressing Goal: Will verbalize feelings 01/24/2024 1817 by Shirley Jon FALCON, RN Outcome: Progressing 01/24/2024 1735 by Shirley Jon FALCON, RN Outcome: Progressing   Problem: Health Behavior/Discharge Planning: Goal: Ability to make decisions will improve 01/24/2024 1817 by Shirley Jon FALCON, RN Outcome: Progressing 01/24/2024 1735 by Shirley Jon FALCON, RN Outcome: Progressing Goal: Compliance with therapeutic regimen will improve 01/24/2024 1817 by Shirley Jon FALCON, RN Outcome: Progressing 01/24/2024 1735 by Shirley Jon FALCON, RN Outcome: Progressing   Problem: Role Relationship: Goal: Will demonstrate positive changes in social behaviors and relationships 01/24/2024 1817 by Shirley Jon FALCON, RN Outcome: Progressing 01/24/2024 1735 by Shirley Jon FALCON, RN Outcome: Progressing   Problem: Safety: Goal: Ability to disclose and discuss suicidal ideas will improve 01/24/2024 1817 by Shirley Jon FALCON, RN Outcome: Progressing 01/24/2024 1735 by Shirley Jon FALCON, RN Outcome: Progressing Goal: Ability to identify and utilize support systems that promote safety will improve 01/24/2024 1817 by Shirley Jon FALCON, RN Outcome: Progressing 01/24/2024 1735 by Shirley Jon FALCON, RN Outcome: Progressing   Problem: Self-Concept: Goal: Will verbalize positive feelings about self 01/24/2024 1817 by Shirley Jon FALCON,  RN Outcome: Progressing 01/24/2024 1735 by Shirley Jon FALCON, RN Outcome: Progressing Goal: Level of anxiety will decrease 01/24/2024 1817 by Shirley Jon FALCON, RN Outcome: Progressing 01/24/2024 1735 by Shirley Jon FALCON, RN Outcome: Progressing   Problem: Education: Goal: Ability to make informed decisions regarding treatment will improve 01/24/2024 1817 by Shirley Jon FALCON, RN Outcome: Progressing 01/24/2024 1735 by Shirley Jon FALCON, RN Outcome: Progressing   Problem: Coping: Goal: Coping ability will improve 01/24/2024 1817 by Shirley Jon FALCON, RN Outcome: Progressing 01/24/2024 1735 by Shirley Jon FALCON, RN Outcome: Progressing   Problem: Health Behavior/Discharge Planning: Goal: Identification of resources available to assist in meeting health care needs will improve 01/24/2024 1817 by Shirley Jon FALCON, RN Outcome: Progressing 01/24/2024 1735 by Shirley Jon FALCON, RN Outcome: Progressing   Problem: Medication: Goal: Compliance with prescribed medication regimen will improve 01/24/2024 1817 by Shirley Jon FALCON, RN Outcome: Progressing 01/24/2024 1735 by Shirley Jon FALCON, RN Outcome: Progressing   Problem: Self-Concept: Goal: Ability to disclose and discuss suicidal ideas will improve 01/24/2024 1817 by Shirley Jon FALCON, RN Outcome: Progressing 01/24/2024 1735 by Shirley Jon FALCON, RN Outcome: Progressing Goal: Will verbalize positive feelings about self 01/24/2024 1817 by Shirley Jon FALCON, RN Outcome: Progressing Note: Patient is on track. Patient will work on increased adherence  01/24/2024 1735 by Shirley Jon FALCON, RN Outcome: Progressing Note: Patient is on track. Patient will work on increased adherence and avoid flare triggers

## 2024-01-24 NOTE — Group Note (Signed)
 Recreation Therapy Group Note   Group Topic:Health and Wellness  Group Date: 01/24/2024 Start Time: 1020 End Time: 1120 Facilitators: Celestia Jeoffrey BRAVO, LRT, CTRS Location: Courtyard  Group Description: [employer]. LRT and patients played games of basketball, drew with chalk, and played corn hole while outside in the courtyard while getting fresh air and sunlight. Music was being played in the background. LRT and peers conversed about different games they have played before, what they do in their free time and anything else that is on their minds. LRT encouraged pts to drink water after being outside, sweating and getting their heart rate up.  Goal Area(s) Addressed: Patient will build on frustration tolerance skills. Patients will partake in a competitive play game with peers. Patients will gain knowledge of new leisure interest/hobby.    Affect/Mood: Appropriate   Participation Level: Active   Participation Quality: Independent   Behavior: Appropriate   Speech/Thought Process: Coherent   Insight: Good   Judgement: Good   Modes of Intervention: Activity   Patient Response to Interventions:  Receptive   Education Outcome:  Acknowledges education   Clinical Observations/Individualized Feedback: Lisa Clay was active in their participation of session activities and group discussion. Pt interacted well with LRT and peers duration of session.    Plan: Continue to engage patient in RT group sessions 2-3x/week.   Jeoffrey BRAVO Celestia, LRT, CTRS 01/24/2024 11:29 AM

## 2024-01-24 NOTE — Plan of Care (Signed)
  Problem: Education: Goal: Knowledge of [facility] Education information/materials will improve Outcome: Progressing Goal: Emotional status will improve Outcome: Progressing Goal: Mental status will improve Outcome: Progressing Goal: Verbalization of understanding the information provided will improve Outcome: Progressing   Problem: Activity: Goal: Interest or engagement in activities will improve Outcome: Progressing Goal: Sleeping patterns will improve Outcome: Progressing   Problem: Coping: Goal: Ability to verbalize frustrations and anger appropriately will improve Outcome: Progressing Goal: Ability to demonstrate self-control will improve Outcome: Progressing   Problem: Health Behavior/Discharge Planning: Goal: Identification of resources available to assist in meeting health care needs will improve Outcome: Progressing Goal: Compliance with treatment plan for underlying cause of condition will improve Outcome: Progressing   Problem: Physical Regulation: Goal: Ability to maintain clinical measurements within normal limits will improve Outcome: Progressing   Problem: Safety: Goal: Periods of time without injury will increase Outcome: Progressing   Problem: Education: Goal: Ability to state activities that reduce stress will improve Outcome: Progressing   Problem: Coping: Goal: Ability to identify and develop effective coping behavior will improve Outcome: Progressing   Problem: Self-Concept: Goal: Ability to identify factors that promote anxiety will improve Outcome: Progressing Goal: Level of anxiety will decrease Outcome: Progressing Goal: Ability to modify response to factors that promote anxiety will improve Outcome: Progressing   Problem: Education: Goal: Utilization of techniques to improve thought processes will improve Outcome: Progressing Goal: Knowledge of the prescribed therapeutic regimen will improve Outcome: Progressing   Problem:  Activity: Goal: Interest or engagement in leisure activities will improve Outcome: Progressing Goal: Imbalance in normal sleep/wake cycle will improve Outcome: Progressing   Problem: Coping: Goal: Coping ability will improve Outcome: Progressing Goal: Will verbalize feelings Outcome: Progressing   Problem: Health Behavior/Discharge Planning: Goal: Ability to make decisions will improve Outcome: Progressing Goal: Compliance with therapeutic regimen will improve Outcome: Progressing   Problem: Role Relationship: Goal: Will demonstrate positive changes in social behaviors and relationships Outcome: Progressing   Problem: Safety: Goal: Ability to disclose and discuss suicidal ideas will improve Outcome: Progressing Goal: Ability to identify and utilize support systems that promote safety will improve Outcome: Progressing   Problem: Self-Concept: Goal: Will verbalize positive feelings about self Outcome: Progressing Goal: Level of anxiety will decrease Outcome: Progressing   Problem: Education: Goal: Ability to make informed decisions regarding treatment will improve Outcome: Progressing   Problem: Coping: Goal: Coping ability will improve Outcome: Progressing   Problem: Health Behavior/Discharge Planning: Goal: Identification of resources available to assist in meeting health care needs will improve Outcome: Progressing   Problem: Medication: Goal: Compliance with prescribed medication regimen will improve Outcome: Progressing   Problem: Self-Concept: Goal: Ability to disclose and discuss suicidal ideas will improve Outcome: Progressing Goal: Will verbalize positive feelings about self Outcome: Progressing Note: Patient is on track. Patient will work on increased adherence and avoid flare triggers

## 2024-01-25 DIAGNOSIS — F3181 Bipolar II disorder: Secondary | ICD-10-CM | POA: Diagnosis not present

## 2024-01-25 MED ORDER — HYDROXYZINE HCL 50 MG PO TABS
50.0000 mg | ORAL_TABLET | Freq: Two times a day (BID) | ORAL | Status: DC
  Administered 2024-01-25 – 2024-01-26 (×2): 50 mg via ORAL
  Filled 2024-01-25 (×2): qty 1

## 2024-01-25 MED ORDER — METHENAMINE MANDELATE 0.5 G PO TABS
1000.0000 mg | ORAL_TABLET | Freq: Two times a day (BID) | ORAL | Status: DC
  Administered 2024-01-26 – 2024-01-31 (×4): 1000 mg via ORAL
  Filled 2024-01-25 (×12): qty 2

## 2024-01-25 MED ORDER — IBUPROFEN 200 MG PO TABS: 400.0000 mg | ORAL_TABLET | Freq: Four times a day (QID) | ORAL | Status: DC | PRN

## 2024-01-25 NOTE — Group Note (Signed)
 LCSW Group Therapy Note  Group Date: 01/25/2024 Start Time: 1300 End Time: 1400   Type of Therapy and Topic:  Group Therapy - Healthy vs Unhealthy Coping Skills  Participation Level:  Active   Description of Group The focus of this group was to determine what unhealthy coping techniques typically are used by group members and what healthy coping techniques would be helpful in coping with various problems. Patients were guided in becoming aware of the differences between healthy and unhealthy coping techniques. Patients were asked to identify 2-3 healthy coping skills they would like to learn to use more effectively.  Therapeutic Goals Patients learned that coping is what human beings do all day long to deal with various situations in their lives Patients defined and discussed healthy vs unhealthy coping techniques Patients identified their preferred coping techniques and identified whether these were healthy or unhealthy Patients determined 2-3 healthy coping skills they would like to become more familiar with and use more often. Patients provided support and ideas to each other   Summary of Patient Progress:  The patient and CSW explored personal triggers and coping skills. Patients completed a thermometer activity, identifying various areas of their lives and rating the emotional intensity of each. Together, the patients and CSW discussed each trigger and collaboratively brainstormed strategies to reduce the associated stress. The patient provided constructive feedback to peers and actively participated in discussions about their own struggles with triggers, sharing how they have worked to manage and navigate them.   Therapeutic Modalities Cognitive Behavioral Therapy Motivational Interviewing  Alveta CHRISTELLA Ezzard ISRAEL 01/25/2024  2:53 PM

## 2024-01-25 NOTE — Plan of Care (Signed)
  Problem: Education: Goal: Emotional status will improve Outcome: Progressing Goal: Mental status will improve Outcome: Progressing   Problem: Activity: Goal: Interest or engagement in activities will improve Outcome: Progressing   Problem: Coping: Goal: Ability to verbalize frustrations and anger appropriately will improve Outcome: Progressing   Problem: Health Behavior/Discharge Planning: Goal: Compliance with treatment plan for underlying cause of condition will improve Outcome: Progressing   Problem: Physical Regulation: Goal: Ability to maintain clinical measurements within normal limits will improve Outcome: Progressing   Problem: Safety: Goal: Periods of time without injury will increase Outcome: Progressing

## 2024-01-25 NOTE — [facility] Suicide Risk Assessment (Addendum)
 [facility] INPATIENT:  Family/Significant Other Suicide Prevention Education  Suicide Prevention Education:  Education Completed;  Rosaline FURY [phone], Friend, has been identified by the patient as the family member/significant other with whom the patient will be residing, and identified as the person(s) who will aid the patient in the event of a mental health crisis (suicidal ideations/suicide attempt).  With written consent from the patient, the family member/significant other has been provided the following suicide prevention education, prior to the and/or following the discharge of the patient.  The suicide prevention education provided includes the following: Suicide risk factors Suicide prevention and interventions National Suicide Hotline telephone number [facility] assessment telephone number [facility] Emergency Assistance 911 [facility] and/or Residential Mobile Crisis Unit telephone number  Request made of family/significant other to: Remove weapons (e.g., guns, rifles, knives), all items previously/currently identified as safety concern.   Remove drugs/medications (over-the-counter, prescriptions, illicit drugs), all items previously/currently identified as a safety concern.  The family member/significant other verbalizes understanding of the suicide prevention education information provided.  The family member/significant other agrees to remove the items of safety concern listed above.  According to patient's friend, patient has been "having issues with her son and daughter." Friend explained that the patient has not be compliant with her medication and "hasn't been drunk but overly medicated." Friend reported that Monday night, the patient reported "I need a gun so I can kill myself." Friend reported that the patient has a "volatile" relationship with her daughter which she believes to trigger much of the patient's emotionality. Friend reported that she believes  the patient to be a danger to herself but not others. Friend also reported that she does not believe the patient has access to weapon.   Kesean Serviss M Saima Monterroso 01/25/2024, 12:47 PM

## 2024-01-25 NOTE — Progress Notes (Signed)
 [facility] MD Progress Note  01/25/2024 3:56 PM Lisa Clay  MRN:  [id number]   Subjective:  Chart reviewed, case discussed in multidisciplinary meeting, patient seen during rounds.   Patient seen today for rounds.  Today they are alert and oriented.  Speech is much more fluent.  They are more linear on exam.  They do indicate that they were attempting suicide with the Ativan  overdose.  They do not further discuss depressive symptoms.  They note that they feel safe here.  They deny current SI.  They report they are having pelvic pain and are currently being worked up for interstitial cystitis.  They report they are seeing an outpatient provider for this.  Reviewed urinalysis the patient indicates to frequently urinalysis of clean but that she request was sent for culture so was sent for culture.  She is also encouraged to follow-up with her urogyn provider regarding her proteinuria.  Patient voiced understanding.  Patient is agreeable with continued medications.  Discussed scheduling hydroxyzine .   Sleep: Fair  Appetite:  Fair  Past Psychiatric History: see h&P Family History:  Family History  Problem Relation Age of Onset   Heart attack Maternal Grandmother 50       deceased   Hypertension Father        and high cholesterol    Hypertension Mother        and high cholesterol    Breast cancer Neg Hx    Social History:  Social History   Substance and Sexual Activity  Alcohol Use No   Comment: rare     Social History   Substance and Sexual Activity  Drug Use No    Social History   Socioeconomic History   Marital status: Divorced    Spouse name: Not on file   Number of children: Not on file   Years of education: Not on file   Highest education level: Not on file  Occupational History   Not on file  Tobacco Use   Smoking status: Never   Smokeless tobacco: Never   Tobacco comments:    tobacco use - no   Vaping Use   Vaping status: Never Used  Substance and  Sexual Activity   Alcohol use: No    Comment: rare   Drug use: No   Sexual activity: Not on file  Other Topics Concern   Not on file  Social History Narrative   Single; full time; does not get regular exercise.       Patient is disabled   Social Drivers of Corporate investment banker Strain: Patient Declined (11/14/2022)   Received from [facility]   Overall Financial Resource Strain (CARDIA)    Difficulty of Paying Living Expenses: Patient declined  Food Insecurity: Patient Declined (01/23/2024)   Hunger Vital Sign    Worried About Running Out of Food in the Last Year: Patient declined    Ran Out of Food in the Last Year: Patient declined  Transportation Needs: Patient Declined (01/23/2024)   PRAPARE - Administrator, Civil Service (Medical): Patient declined    Lack of Transportation (Non-Medical): Patient declined  Physical Activity: Not on file  Stress: Not on file  Social Connections: Socially Isolated (01/23/2024)   Social Connection and Isolation Panel    Frequency of Communication with Friends and Family: Three times a week    Frequency of Social Gatherings with Friends and Family: Three times a week    Attends Religious Services: Never  Active Member of Clubs or Organizations: No    Attends Banker Meetings: Never    Marital Status: Separated   Past Medical History:  Past Medical History:  Diagnosis Date   Atrial tachycardia (HCC)    Dysrhythmia    Hx: PSVT. Current: Postural Orthostatic Tachycardia Syndrome, Inappropriate Sinus Node Tachycardia   Headache    migraines, rare.   Kidney stones    Palpitations    [facility] 2/12   Shortness of breath dyspnea    on exertion, secondary to heart issues   Sinus tachycardia    [facility] 2/12   Syncope    secondary to heart issues    Past Surgical History:  Procedure Laterality Date   BREAST BIOPSY Left 05/11/2013   stereo biopsy   CARDIAC ELECTROPHYSIOLOGY STUDY AND ABLATION   04/24/11   [facility]   CLOSED MANIPULATION SHOULDER WITH STERIOD INJECTION Right 02/16/2015   Procedure: CLOSED MANIPULATION SHOULDER WITH STEROID INJECTION;  Surgeon: Norleen JINNY Maltos, MD;  Location: [facility];  Service: Orthopedics;  Laterality: Right;   FINGER SURGERY Right 12/25/2017   explorative surgery on R5th PIP    TONSILLECTOMY      Current Medications: Current Facility-Administered Medications  Medication Dose Route Frequency Provider Last Rate Last Admin   acetaminophen  (TYLENOL ) tablet 650 mg  650 mg Oral Q6H PRN Jadapalle, Sree, MD   650 mg at 01/25/24 1530   alum & mag hydroxide-simeth (MAALOX/MYLANTA) 200-200-20 MG/5ML suspension 30 mL  30 mL Oral Q4H PRN Jadapalle, Sree, MD       ARIPiprazole  (ABILIFY ) tablet 2 mg  2 mg Oral Daily Adyan Palau E, PA-C   2 mg at 01/25/24 9060   atorvastatin  (LIPITOR) tablet 40 mg  40 mg Oral QHS Jadapalle, Sree, MD   40 mg at 01/24/24 2205   conjugated estrogens  (PREMARIN ) vaginal cream 1 Applicatorful  1 Applicatorful Vaginal Q M,W,F Jadapalle, Sree, MD   1 Applicatorful at 01/24/24 2100   cyanocobalamin  (VITAMIN B12) tablet 1,000 mcg  1,000 mcg Oral Daily Jadapalle, Sree, MD   1,000 mcg at 01/25/24 0935   haloperidol  (HALDOL ) tablet 5 mg  5 mg Oral TID PRN Jadapalle, Sree, MD       And   diphenhydrAMINE  (BENADRYL ) capsule 50 mg  50 mg Oral TID PRN Jadapalle, Sree, MD       haloperidol  lactate (HALDOL ) injection 5 mg  5 mg Intramuscular TID PRN Donnelly Mellow, MD       And   diphenhydrAMINE  (BENADRYL ) injection 50 mg  50 mg Intramuscular TID PRN Jadapalle, Sree, MD       And   LORazepam  (ATIVAN ) injection 2 mg  2 mg Intramuscular TID PRN Jadapalle, Sree, MD       haloperidol  lactate (HALDOL ) injection 10 mg  10 mg Intramuscular TID PRN Jadapalle, Sree, MD       And   diphenhydrAMINE  (BENADRYL ) injection 50 mg  50 mg Intramuscular TID PRN Jadapalle, Sree, MD       And   LORazepam  (ATIVAN ) injection 2 mg  2 mg  Intramuscular TID PRN Jadapalle, Sree, MD       escitalopram  (LEXAPRO ) tablet 10 mg  10 mg Oral Daily Marijose Curington E, PA-C   10 mg at 01/25/24 9062   fludrocortisone  (FLORINEF ) tablet 100 mcg  100 mcg Oral Daily Jadapalle, Sree, MD   100 mcg at 01/25/24 9062   folic acid  (FOLVITE ) tablet 1 mg  1 mg Oral Daily Jadapalle, Sree, MD  1 mg at 01/25/24 0935   gabapentin  (NEURONTIN ) capsule 1,200 mg  1,200 mg Oral QHS Jadapalle, Sree, MD   1,200 mg at 01/24/24 2204   hydrOXYzine  (ATARAX ) tablet 50 mg  50 mg Oral BID Winifred Bodiford E, PA-C   50 mg at 01/25/24 1531   indomethacin  (INDOCIN ) capsule 50 mg  50 mg Oral BID WC Jadapalle, Sree, MD   50 mg at 01/25/24 9062   ivabradine  (CORLANOR ) tablet 5 mg  5 mg Oral BID Jadapalle, Sree, MD   5 mg at 01/25/24 0935   magnesium  hydroxide (MILK OF MAGNESIA) suspension 30 mL  30 mL Oral Daily PRN Donnelly Mellow, MD       NOREEN ON 01/26/2024] methenamine  (MANDELAMINE) tablet 1,000 mg  1,000 mg Oral BID Leena Tiede E, PA-C       methotrexate  (RHEUMATREX) tablet 15 mg  15 mg Oral Q Fri Jadapalle, Mellow, MD   15 mg at 01/24/24 0830   metoprolol  succinate (TOPROL -XL) 24 hr tablet 12.5 mg  12.5 mg Oral Daily Jadapalle, Sree, MD   12.5 mg at 01/25/24 9060   nortriptyline  (PAMELOR ) capsule 10 mg  10 mg Oral QHS Jadapalle, Sree, MD   10 mg at 01/24/24 2203   topiramate  (TOPAMAX ) tablet 50 mg  50 mg Oral TID Jadapalle, Sree, MD   50 mg at 01/25/24 1255   traZODone  (DESYREL ) tablet 50 mg  50 mg Oral QHS PRN Jadapalle, Sree, MD   50 mg at 01/24/24 2211    Lab Results:  Results for orders placed or performed during the hospital encounter of 01/23/24 (from the past 48 hours)  Urinalysis, Routine w reflex microscopic -Urine, Unspecified Source     Status: Abnormal   Collection Time: 01/24/24 11:30 AM  Result Value Ref Range   Color, Urine YELLOW (A) YELLOW   APPearance HAZY (A) CLEAR   Specific Gravity, Urine 1.019 1.005 - 1.030   pH 5.0 5.0 - 8.0    Glucose, UA NEGATIVE NEGATIVE mg/dL   Hgb urine dipstick SMALL (A) NEGATIVE   Bilirubin Urine NEGATIVE NEGATIVE   Ketones, ur NEGATIVE NEGATIVE mg/dL   Protein, ur >=699 (A) NEGATIVE mg/dL   Nitrite NEGATIVE NEGATIVE   Leukocytes,Ua NEGATIVE NEGATIVE   RBC / HPF 21-50 0 - 5 RBC/hpf   WBC, UA >50 0 - 5 WBC/hpf   Bacteria, UA RARE (A) NONE SEEN   Squamous Epithelial / HPF 0 0 - 5 /HPF   Mucus PRESENT     Comment: Performed at [facility], [address] [place], [place] [address]    Blood Alcohol level:  Lab Results  Component Value Date   [facility] <15 01/22/2024    Metabolic Disorder Labs: No results found for: HGBA1C, MPG No results found for: PROLACTIN No results found for: CHOL, TRIG, HDL, CHOLHDL, VLDL, LDLCALC  Physical Findings: AIMS:  , ,  ,  ,    CIWA:    COWS:      Psychiatric Specialty Exam:  Presentation  General Appearance:  Casual  Eye Contact: Fleeting  Speech: Clear and Coherent  Speech Volume: Decreased    Mood and Affect  Mood: Anxious  Affect: Blunt   Thought Process  Thought Processes:No data recorded Descriptions of Associations:Circumstantial  Orientation:Full (Time, Place and Person)  Thought Content:Other (comment) (thought blocking)  Hallucinations:Hallucinations: None  Ideas of Reference:None  Suicidal Thoughts:Suicidal Thoughts: No  Homicidal Thoughts:Homicidal Thoughts: No   Sensorium  Memory: Immediate Poor  Judgment: Poor  Insight: Poor  Executive Functions  Concentration:No data recorded Attention Span:No data recorded Recall:No data recorded [employer] of Knowledge:No data recorded Language:No data recorded  Psychomotor Activity  Psychomotor Activity: Psychomotor Activity: Normal  Musculoskeletal: Strength & Muscle Tone: within normal limits Gait & Station: normal Assets  Assets: Housing    Physical Exam: Physical Exam Vitals and nursing note reviewed.  HENT:      Head: Atraumatic.  Eyes:     Extraocular Movements: Extraocular movements intact.  Pulmonary:     Effort: Pulmonary effort is normal.  Neurological:     Mental Status: She is alert and oriented to person, place, and time.    Review of Systems  Genitourinary:  Positive for dysuria.  Psychiatric/Behavioral:  Positive for depression. Negative for hallucinations, substance abuse and suicidal ideas. The patient is nervous/anxious. The patient does not have insomnia.    Blood pressure 107/64, pulse 78, temperature (!) 97.2 F (36.2 C), temperature source Oral, resp. rate 18, height 5' 6 (1.676 m), weight 56.9 kg, SpO2 95%. Body mass index is 20.26 kg/m.  Diagnosis: Principal Problem:   Bipolar 2 disorder (HCC)   PLAN: Safety and Monitoring:  -- Voluntary admission to inpatient psychiatric unit for safety, stabilization and treatment  -- Daily contact with patient to assess and evaluate symptoms and progress in treatment  -- Patient's case to be discussed in multi-disciplinary team meeting  -- Observation Level : q15 minute checks  -- Vital signs:  q12 hours  -- Precautions: suicide, elopement, and assault -- Encouraged patient to participate in unit milieu and in scheduled group therapies  2. Psychiatric Diagnoses and Treatment:      Unspecified mood disorder:         - Continue Abilify  2 mg daily - Continue Lexapro  to 10 mg daily, discussed transition to Cymbalta for chronic pain - Continue nortriptyline  for sleep 10 mg at bedtime - Continue gabapentin  1200 mg nightly which patient reports for sleep and anxiety   -- The risks/benefits/side-effects/alternatives to this medication were discussed in detail with the patient and time was given for questions. The patient consents to medication trial.                -- Metabolic profile and EKG monitoring obtained while on an atypical antipsychotic (BMI: Lipid Panel: HbgA1c: QTc:)              -- Encouraged patient to participate in  unit milieu and in scheduled group therapies                            3. Medical Issues Being Addressed:   home meds reordered as such Atorvastatin  40 mg nightly Premarin  1 application Monday Wednesday Friday Vitamin B12 1000 mcg daily Florinef  100 mcg daily Indomethacin  50 mg twice daily Corlanor  5 mg twice daily Methenamine  1000 mg 4 times daily Metoprolol  12.5 mg daily Methotrexate  15 mg every Friday Topamax  50 mg 3 times daily  Patient was complaining of pain which she believes is a UTI urine was sent for culture.  Spoke with on-call OB GYN Dr. Beverli Schermerhorn sepsis management beneficial for interstitial cystitis includes hydroxyzine  amitriptyline and NSAIDs.  Patient is on hydroxyzine  will try scheduled dose for several days states currently taking nortriptyline  and they are on indomethacin .  If culture is positive we will treat for UTI.  4. Discharge Planning:   -- Social work and case management to assist with discharge planning and identification of hospital follow-up needs  prior to discharge  -- Estimated LOS: 3-4 days  Donnice FORBES Right, PA-C 01/25/2024, 3:56 PM

## 2024-01-25 NOTE — Progress Notes (Signed)
   01/25/24 1300  Psych Admission Type (Psych Patients Only)  Admission Status Involuntary  Psychosocial Assessment  Patient Complaints Anxiety;Depression;Sleep disturbance  Eye Contact Fair  Facial Expression Animated;Anxious;Pained  Affect Anxious  Speech Logical/coherent  Interaction Assertive  Motor Activity Slow  Appearance/Hygiene Unremarkable  Behavior Characteristics Cooperative;Anxious  Mood Anxious;Depressed;Pleasant (Patient writes her goal for today is expressing feelings. She writes she will talk to someone when upset to help meet that goal.)  Thought Process  Coherency WDL  Content WDL  Delusions None reported or observed  Perception WDL  Hallucination None reported or observed  Judgment Poor (improving)  Confusion None  Danger to Self  Current suicidal ideation? Denies  Danger to Others  Danger to Others None reported or observed

## 2024-01-25 NOTE — Group Note (Signed)
 Date:  01/25/2024 Time:  8:48 PM  Group Topic/Focus:  Managing Feelings:   The focus of this group is to identify what feelings patients have difficulty handling and develop a plan to handle them in a healthier way upon discharge.    Participation Level:  Active  Participation Quality:  Appropriate and Attentive  Affect:  Appropriate  Cognitive:  Alert and Appropriate  Insight: Appropriate and Good  Engagement in Group:  Engaged and Improving  Modes of Intervention:  Clarification, Discussion, Education, Rapport Building, and Support  Additional Comments:     Wasil Wolke 01/25/2024, 8:48 PM

## 2024-01-26 DIAGNOSIS — F3181 Bipolar II disorder: Secondary | ICD-10-CM | POA: Diagnosis not present

## 2024-01-26 MED ORDER — DIPHENHYDRAMINE HCL 25 MG PO CAPS
25.0000 mg | ORAL_CAPSULE | Freq: Two times a day (BID) | ORAL | Status: DC | PRN
  Administered 2024-01-26: 50 mg via ORAL
  Administered 2024-01-26 – 2024-01-27 (×2): 25 mg via ORAL
  Filled 2024-01-26 (×2): qty 1

## 2024-01-26 NOTE — Progress Notes (Signed)
 [facility] MD Progress Note  01/26/2024 2:34 PM Lisa Clay  MRN:  [id number]   Subjective:  Chart reviewed, case discussed in multidisciplinary meeting, patient seen during rounds.   8/17: Patient seen today for rounds.  Today they are alert and oriented. They are pleasant and cooperative on exam. Affect is bright. The are reclusive to the room. Thought process is linear. They provided urine for culture. They deny adverse effects of the medications. Ha some difficulty sleeping last night states she took a benadryl  and it was helpful. States the benadryl  also helped with her pain. Denies SI/HI/AVH. Again discuss that she took the OD as a suicide attempt and she reports feeling guilty today and that she wants to move forward. She was able to talk to her son yesterday states it was a tough conversation and she felt guilty afterwards, but feels that it will help her move forward. States she wants to get better for herself and her family. 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 Encourage coping skills and following up with outpatient therapy.   8/16 Patient seen today for rounds.  Today they are alert and oriented.  Speech is much more fluent.  They are more linear on exam.  They do indicate that they were attempting suicide with the Ativan  overdose.  They do not further discuss depressive symptoms.  They note that they feel safe here.  They deny current SI.  They report they are having pelvic pain and are currently being worked up for interstitial cystitis.  They report they are seeing an outpatient provider for this.  Reviewed urinalysis the patient indicates to frequently urinalysis of clean but that she request was sent for culture so was sent for culture.  She is also encouraged to follow-up with her urogyn provider regarding her proteinuria.  Patient voiced understanding.  Patient is agreeable with continued medications.  Discussed scheduling hydroxyzine .   Sleep: Fair  Appetite:   Fair  Past Psychiatric History: see h&P Family History:  Family History  Problem Relation Age of Onset   Heart attack Maternal Grandmother 50       deceased   Hypertension Father        and high cholesterol    Hypertension Mother        and high cholesterol    Breast cancer Neg Hx    Social History:  Social History   Substance and Sexual Activity  Alcohol Use No   Comment: rare     Social History   Substance and Sexual Activity  Drug Use No    Social History   Socioeconomic History   Marital status: Divorced    Spouse name: Not on file   Number of children: Not on file   Years of education: Not on file   Highest education level: Not on file  Occupational History   Not on file  Tobacco Use   Smoking status: Never   Smokeless tobacco: Never   Tobacco comments:    tobacco use - no   Vaping Use   Vaping status: Never Used  Substance and Sexual Activity   Alcohol use: No    Comment: rare   Drug use: No   Sexual activity: Not on file  Other Topics Concern   Not on file  Social History Narrative   Single; full time; does not get regular exercise.       Patient is disabled   Social Drivers of Corporate investment banker Strain: Patient Declined (  11/14/2022)   Received from [facility]   Overall Financial Resource Strain (CARDIA)    Difficulty of Paying Living Expenses: Patient declined  Food Insecurity: Patient Declined (01/23/2024)   Hunger Vital Sign    Worried About Running Out of Food in the Last Year: Patient declined    Ran Out of Food in the Last Year: Patient declined  Transportation Needs: Patient Declined (01/23/2024)   PRAPARE - Administrator, Civil Service (Medical): Patient declined    Lack of Transportation (Non-Medical): Patient declined  Physical Activity: Not on file  Stress: Not on file  Social Connections: Socially Isolated (01/23/2024)   Social Connection and Isolation Panel    Frequency of Communication  with Friends and Family: Three times a week    Frequency of Social Gatherings with Friends and Family: Three times a week    Attends Religious Services: Never    Active Member of Clubs or Organizations: No    Attends Banker Meetings: Never    Marital Status: Separated   Past Medical History:  Past Medical History:  Diagnosis Date   Atrial tachycardia (HCC)    Dysrhythmia    Hx: PSVT. Current: Postural Orthostatic Tachycardia Syndrome, Inappropriate Sinus Node Tachycardia   Headache    migraines, rare.   Kidney stones    Palpitations    [facility] 2/12   Shortness of breath dyspnea    on exertion, secondary to heart issues   Sinus tachycardia    [facility] 2/12   Syncope    secondary to heart issues    Past Surgical History:  Procedure Laterality Date   BREAST BIOPSY Left 05/11/2013   stereo biopsy   CARDIAC ELECTROPHYSIOLOGY STUDY AND ABLATION  04/24/11   [facility]   CLOSED MANIPULATION SHOULDER WITH STERIOD INJECTION Right 02/16/2015   Procedure: CLOSED MANIPULATION SHOULDER WITH STEROID INJECTION;  Surgeon: Norleen JINNY Maltos, MD;  Location: [facility];  Service: Orthopedics;  Laterality: Right;   FINGER SURGERY Right 12/25/2017   explorative surgery on R5th PIP    TONSILLECTOMY      Current Medications: Current Facility-Administered Medications  Medication Dose Route Frequency Provider Last Rate Last Admin   acetaminophen  (TYLENOL ) tablet 650 mg  650 mg Oral Q6H PRN Jadapalle, Sree, MD   650 mg at 01/26/24 0916   alum & mag hydroxide-simeth (MAALOX/MYLANTA) 200-200-20 MG/5ML suspension 30 mL  30 mL Oral Q4H PRN Jadapalle, Sree, MD       ARIPiprazole  (ABILIFY ) tablet 2 mg  2 mg Oral Daily Nour Scalise E, PA-C   2 mg at 01/26/24 9096   atorvastatin  (LIPITOR) tablet 40 mg  40 mg Oral QHS Jadapalle, Sree, MD   40 mg at 01/25/24 2148   conjugated estrogens  (PREMARIN ) vaginal cream 1 Applicatorful  1 Applicatorful Vaginal Q M,W,F Jadapalle, Sree, MD   1  Applicatorful at 01/24/24 2100   cyanocobalamin  (VITAMIN B12) tablet 1,000 mcg  1,000 mcg Oral Daily Jadapalle, Sree, MD   1,000 mcg at 01/26/24 9095   haloperidol  (HALDOL ) tablet 5 mg  5 mg Oral TID PRN Jadapalle, Sree, MD       And   diphenhydrAMINE  (BENADRYL ) capsule 50 mg  50 mg Oral TID PRN Jadapalle, Sree, MD   50 mg at 01/25/24 2200   haloperidol  lactate (HALDOL ) injection 5 mg  5 mg Intramuscular TID PRN Jadapalle, Sree, MD       And   diphenhydrAMINE  (BENADRYL ) injection 50 mg  50 mg Intramuscular TID  PRN Jadapalle, Sree, MD       And   LORazepam  (ATIVAN ) injection 2 mg  2 mg Intramuscular TID PRN Jadapalle, Sree, MD       haloperidol  lactate (HALDOL ) injection 10 mg  10 mg Intramuscular TID PRN Jadapalle, Sree, MD       And   diphenhydrAMINE  (BENADRYL ) injection 50 mg  50 mg Intramuscular TID PRN Jadapalle, Sree, MD       And   LORazepam  (ATIVAN ) injection 2 mg  2 mg Intramuscular TID PRN Jadapalle, Sree, MD       escitalopram  (LEXAPRO ) tablet 10 mg  10 mg Oral Daily Eliyah Bazzi E, PA-C   10 mg at 01/26/24 0900   fludrocortisone  (FLORINEF ) tablet 100 mcg  100 mcg Oral Daily Jadapalle, Sree, MD   100 mcg at 01/26/24 0905   folic acid  (FOLVITE ) tablet 1 mg  1 mg Oral Daily Jadapalle, Sree, MD   1 mg at 01/26/24 0900   gabapentin  (NEURONTIN ) capsule 1,200 mg  1,200 mg Oral QHS Jadapalle, Sree, MD   1,200 mg at 01/25/24 2148   hydrOXYzine  (ATARAX ) tablet 50 mg  50 mg Oral BID Chett Taniguchi E, PA-C   50 mg at 01/26/24 0900   indomethacin  (INDOCIN ) capsule 50 mg  50 mg Oral BID WC Jadapalle, Sree, MD   50 mg at 01/26/24 9096   ivabradine  (CORLANOR ) tablet 5 mg  5 mg Oral BID Jadapalle, Sree, MD   5 mg at 01/26/24 9095   magnesium  hydroxide (MILK OF MAGNESIA) suspension 30 mL  30 mL Oral Daily PRN Donnelly Mellow, MD       methenamine  (MANDELAMINE) tablet 1,000 mg  1,000 mg Oral BID Lynnsey Barbara E, PA-C   1,000 mg at 01/26/24 9095   methotrexate  (RHEUMATREX) tablet 15  mg  15 mg Oral Q Fri Jadapalle, Sree, MD   15 mg at 01/24/24 0830   metoprolol  succinate (TOPROL -XL) 24 hr tablet 12.5 mg  12.5 mg Oral Daily Jadapalle, Sree, MD   12.5 mg at 01/26/24 0900   nortriptyline  (PAMELOR ) capsule 10 mg  10 mg Oral QHS Jadapalle, Sree, MD   10 mg at 01/25/24 2148   topiramate  (TOPAMAX ) tablet 50 mg  50 mg Oral TID Jadapalle, Sree, MD   50 mg at 01/26/24 1157   traZODone  (DESYREL ) tablet 50 mg  50 mg Oral QHS PRN Jadapalle, Sree, MD   50 mg at 01/24/24 2211    Lab Results:  No results found for this or any previous visit (from the past 48 hours).   Blood Alcohol level:  Lab Results  Component Value Date   [facility] <15 01/22/2024    Metabolic Disorder Labs: No results found for: HGBA1C, MPG No results found for: PROLACTIN No results found for: CHOL, TRIG, HDL, CHOLHDL, VLDL, LDLCALC  Physical Findings: AIMS:  , ,  ,  ,    CIWA:    COWS:      Psychiatric Specialty Exam:  Presentation  General Appearance:  Casual  Eye Contact: Fleeting  Speech: Clear and Coherent  Speech Volume: Decreased    Mood and Affect  Mood: Anxious  Affect: Blunt   Thought Process  Thought Processes:No data recorded Descriptions of Associations:Circumstantial  Orientation:Full (Time, Place and Person)  Thought Content:Other (comment) (thought blocking)  Hallucinations:No data recorded  Ideas of Reference:None  Suicidal Thoughts:No data recorded  Homicidal Thoughts:No data recorded   Sensorium  Memory: Immediate Poor  Judgment: Poor  Insight: Poor   Art therapist  Concentration:No data recorded Attention Span:No data recorded Recall:No data recorded Fund of Knowledge:No data recorded Language:No data recorded  Psychomotor Activity  Psychomotor Activity: No data recorded  Musculoskeletal: Strength & Muscle Tone: within normal limits Gait & Station: normal Assets  Assets: Housing    Physical  Exam: Physical Exam Vitals and nursing note reviewed.  HENT:     Head: Atraumatic.  Eyes:     Extraocular Movements: Extraocular movements intact.  Pulmonary:     Effort: Pulmonary effort is normal.  Neurological:     Mental Status: She is alert and oriented to person, place, and time.    Review of Systems  Genitourinary:  Positive for dysuria.  Psychiatric/Behavioral:  Positive for depression. Negative for hallucinations, substance abuse and suicidal ideas. The patient is nervous/anxious. The patient does not have insomnia.    Blood pressure 114/77, pulse 88, temperature 98 F (36.7 C), resp. rate 18, height 5' 6 (1.676 m), weight 56.9 kg, SpO2 96%. Body mass index is 20.26 kg/m.  Diagnosis: Principal Problem:   Bipolar 2 disorder (HCC)   PLAN: Safety and Monitoring:  -- Voluntary admission to inpatient psychiatric unit for safety, stabilization and treatment  -- Daily contact with patient to assess and evaluate symptoms and progress in treatment  -- Patient's case to be discussed in multi-disciplinary team meeting  -- Observation Level : q15 minute checks  -- Vital signs:  q12 hours  -- Precautions: suicide, elopement, and assault -- Encouraged patient to participate in unit milieu and in scheduled group therapies  2. Psychiatric Diagnoses and Treatment:   Patient is progressing, she did identify the ativan  overdose as a suicide attempt. Pt requires continues psychiatric hospitalization for medication management and stabilization.       Unspecified mood disorder:         - Continue Abilify  2 mg daily - Continue Lexapro  to 10 mg daily, discussed transition to Cymbalta for chronic pain - Continue nortriptyline  for sleep 10 mg at bedtime - Continue gabapentin  1200 mg nightly which patient reports for sleep and anxiety   -- The risks/benefits/side-effects/alternatives to this medication were discussed in detail with the patient and time was given for questions. The patient  consents to medication trial.                -- Metabolic profile and EKG monitoring obtained while on an atypical antipsychotic (BMI: Lipid Panel: HbgA1c: QTc:)              -- Encouraged patient to participate in unit milieu and in scheduled group therapies                            3. Medical Issues Being Addressed:   home meds reordered as such Atorvastatin  40 mg nightly Premarin  1 application Monday Wednesday Friday Vitamin B12 1000 mcg daily Florinef  100 mcg daily Indomethacin  50 mg twice daily Corlanor  5 mg twice daily Methenamine  1000 mg 4 times daily Metoprolol  12.5 mg daily Methotrexate  15 mg every Friday Topamax  50 mg 3 times daily  Patient was complaining of pain which she believes is a UTI urine was sent for culture.  Spoke with on-call OB GYN Dr. Beverli Schermerhorn sepsis management beneficial for interstitial cystitis includes hydroxyzine  amitriptyline and NSAIDs.  Patient is on hydroxyzine  will try scheduled dose for several days states currently taking nortriptyline  and they are on indomethacin .  If culture is positive we will treat for UTI. Culture pending pt prefers  benadryl  to hydroxyzine .   4. Discharge Planning:   -- Social work and case management to assist with discharge planning and identification of hospital follow-up needs prior to discharge  -- Estimated LOS: 3-4 days  Donnice FORBES Right, PA-C 01/26/2024, 2:34 PM

## 2024-01-26 NOTE — Progress Notes (Signed)
    01/26/24 0024  Psych Admission Type (Psych Patients Only)  Admission Status Voluntary  Psychosocial Assessment  Patient Complaints Anxiety;Depression;Sleep disturbance  Eye Contact Fair  Facial Expression Anxious;Animated;Pained  Affect Anxious  Speech Logical/coherent  Interaction Assertive  Motor Activity Slow  Appearance/Hygiene Unremarkable  Behavior Characteristics Cooperative;Anxious  Mood Anxious  Aggressive Behavior  Effect No apparent injury  Thought Process  Coherency WDL  Content WDL  Delusions None reported or observed  Perception WDL  Hallucination None reported or observed  Judgment Poor  Confusion None  Danger to Self  Current suicidal ideation? Denies  Danger to Others  Danger to Others None reported or observed

## 2024-01-26 NOTE — Group Note (Signed)
 Date:  01/26/2024 Time:  10:28 AM  Group Topic/Focus:  Goals Group:   The focus of this group is to help patients establish daily goals to achieve during treatment and discuss how the patient can incorporate goal setting into their daily lives to aide in recovery.    Participation Level:  Active  Participation Quality:  Appropriate  Affect:  Appropriate  Cognitive:  Appropriate  Insight: Appropriate  Engagement in Group:  Engaged  Modes of Intervention:  Activity, Discussion, and Education  Additional Comments:    Skippy LITTIE Bennett 01/26/2024, 10:28 AM

## 2024-01-26 NOTE — Group Note (Signed)
 Date:  01/26/2024 Time:  8:52 PM  Group Topic/Focus:  Wrap-Up Group:   The focus of this group is to help patients review their daily goal of treatment and discuss progress on daily workbooks.    Participation Level:  Active  Participation Quality:  Appropriate, Attentive, and Supportive  Affect:  Appropriate  Cognitive:  Appropriate  Insight: Appropriate and Good  Engagement in Group:  Engaged and Supportive  Modes of Intervention:  Discussion  Additional Comments:     Kerri Katz 01/26/2024, 8:52 PM

## 2024-01-26 NOTE — Progress Notes (Signed)
   01/26/24 1000  Psych Admission Type (Psych Patients Only)  Admission Status Voluntary  Psychosocial Assessment  Patient Complaints Anxiety;Sleep disturbance  Eye Contact Fair  Facial Expression Animated;Pained  Affect Anxious  Speech Logical/coherent  Interaction Assertive  Motor Activity Slow  Appearance/Hygiene Unremarkable  Behavior Characteristics Cooperative;Anxious;Intrusive  Mood Anxious  Aggressive Behavior  Effect No apparent injury  Thought Process  Coherency WDL  Content WDL  Delusions None reported or observed  Perception WDL  Hallucination None reported or observed  Judgment Limited  Confusion None  Danger to Self  Current suicidal ideation? Denies  Danger to Others  Danger to Others None reported or observed   Patient had PRN for anxiety x 2.Stated that Vistaril  does not do anything with her anxiety. Benadryl  helped. Appropriate with staff Peers.

## 2024-01-26 NOTE — Plan of Care (Signed)
  Problem: Education: Goal: Knowledge of [facility] Education information/materials will improve Outcome: Progressing   Problem: Education: Goal: Emotional status will improve Outcome: Progressing   

## 2024-01-26 NOTE — Plan of Care (Signed)
  Problem: Education: Goal: Emotional status will improve Outcome: Progressing Goal: Mental status will improve Outcome: Progressing   Problem: Activity: Goal: Interest or engagement in activities will improve Outcome: Progressing   Problem: Safety: Goal: Periods of time without injury will increase Outcome: Progressing   Problem: Education: Goal: Ability to state activities that reduce stress will improve Outcome: Progressing   Problem: Coping: Goal: Ability to identify and develop effective coping behavior will improve Outcome: Progressing   Problem: Coping: Goal: Coping ability will improve Outcome: Progressing

## 2024-01-27 LAB — BASIC METABOLIC PANEL WITH GFR
Anion gap: 8 (ref 5–15)
BUN: 31 mg/dL — ABNORMAL HIGH (ref 6–20)
CO2: 25 mmol/L (ref 22–32)
Calcium: 8.9 mg/dL (ref 8.9–10.3)
Chloride: 111 mmol/L (ref 98–111)
Creatinine, Ser: 0.84 mg/dL (ref 0.44–1.00)
GFR, Estimated: >60 mL/min (ref >60)
Glucose, Bld: 109 mg/dL — ABNORMAL HIGH (ref 70–99)
Potassium: 4.1 mmol/L (ref 3.5–5.1)
Sodium: 144 mmol/L (ref 135–145)

## 2024-01-27 MED ORDER — QUETIAPINE FUMARATE 100 MG PO TABS
100.0000 mg | ORAL_TABLET | Freq: Every day | ORAL | Status: DC
  Administered 2024-01-27 – 2024-01-30 (×4): 100 mg via ORAL
  Filled 2024-01-27 (×5): qty 1

## 2024-01-27 MED ORDER — CEPHALEXIN 500 MG PO CAPS
500.0000 mg | ORAL_CAPSULE | Freq: Two times a day (BID) | ORAL | Status: DC
  Administered 2024-01-27 – 2024-01-28 (×2): 500 mg via ORAL
  Filled 2024-01-27 (×2): qty 1

## 2024-01-27 MED ORDER — ONDANSETRON 4 MG PO TBDP
4.0000 mg | ORAL_TABLET | Freq: Once | ORAL | Status: AC
  Administered 2024-01-27: 4 mg via ORAL
  Filled 2024-01-27: qty 1

## 2024-01-27 NOTE — Group Note (Signed)
 [facility] LCSW Group Therapy Note    Group Date: 01/27/2024 Start Time: 1300 End Time: 1400  Type of Therapy and Topic:  Group Therapy:  Overcoming Obstacles  Participation Level:  [facility] PARTICIPATION LEVEL: None   Description of Group:   In this group patients will be encouraged to explore what they see as obstacles to their own wellness and recovery. They will be guided to discuss their thoughts, feelings, and behaviors related to these obstacles. The group will process together ways to cope with barriers, with attention given to specific choices patients can make. Each patient will be challenged to identify changes they are motivated to make in order to overcome their obstacles. This group will be process-oriented, with patients participating in exploration of their own experiences as well as giving and receiving support and challenge from other group members.  Therapeutic Goals: 1. Patient will identify personal and current obstacles as they relate to admission. 2. Patient will identify barriers that currently interfere with their wellness or overcoming obstacles.  3. Patient will identify feelings, thought process and behaviors related to these barriers. 4. Patient will identify two changes they are willing to make to overcome these obstacles:    Summary of Patient Progress Patient came to group during the last ten minutes. During time in the room, pt did not contribute to the conversation.    Therapeutic Modalities:   Cognitive Behavioral Therapy Solution Focused Therapy Motivational Interviewing Relapse Prevention Therapy   Nadara JONELLE Fam, LCSW

## 2024-01-27 NOTE — Progress Notes (Signed)
 [facility] MD Progress Note  01/27/2024 12:11 PM Lisa Clay  MRN:  [id number]   Subjective:  Chart reviewed, case discussed in multidisciplinary meeting, patient seen during rounds.   8/18: Patient seen today for follow-up psychiatric evaluation. Following introductions and education related to the purpose of this interview, she reviewed symptoms leading to this admission. She reports not remembering much but admits to taking "too much medication" and posting "peace out" on Facebook. She describes taking eight Ativan  and eight of another medication. Patient identifies the primary trigger as discord with her children, noting an argument with her 53 year old son that escalated and describing her 53 year old daughter as "toxic."  She denies any prior psychiatric treatment. Patient reports a history of suicidal ideation as a ninth grader due to uninvolved parents but denies past self-harm activity. Current stressors include her mother's dementia diagnosis. She endorses feeling "brain fog," with disorganization and thought blocking observed during the interview. She reports poor sleep, typically staying up until 4 a.m. and waking before breakfast, leaving her tired during the day. She denies auditory or visual hallucinations, paranoia, or delusions. She denies suicidal or homicidal ideation at this time. She denies medication side effects.She reports mood anxious and worried.    8/17: Patient seen today for rounds.  Today they are alert and oriented. They are pleasant and cooperative on exam. Affect is bright. The are reclusive to the room. Thought process is linear. They provided urine for culture. They deny adverse effects of the medications. Ha some difficulty sleeping last night states she took a benadryl  and it was helpful. States the benadryl  also helped with her pain. Denies SI/HI/AVH. Again discuss that she took the OD as a suicide attempt and she reports feeling guilty today and that she wants  to move forward. She was able to talk to her son yesterday states it was a tough conversation and she felt guilty afterwards, but feels that it will help her move forward. States she wants to get better for herself and her family. 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 Encourage coping skills and following up with outpatient therapy.   8/16 Patient seen today for rounds.  Today they are alert and oriented.  Speech is much more fluent.  They are more linear on exam.  They do indicate that they were attempting suicide with the Ativan  overdose.  They do not further discuss depressive symptoms.  They note that they feel safe here.  They deny current SI.  They report they are having pelvic pain and are currently being worked up for interstitial cystitis.  They report they are seeing an outpatient provider for this.  Reviewed urinalysis the patient indicates to frequently urinalysis of clean but that she request was sent for culture so was sent for culture.  She is also encouraged to follow-up with her urogyn provider regarding her proteinuria.  Patient voiced understanding.  Patient is agreeable with continued medications.  Discussed scheduling hydroxyzine .   Sleep: poor  Appetite:  Fair  Past Psychiatric History: see h&P Family History:  Family History  Problem Relation Age of Onset   Heart attack Maternal Grandmother 50       deceased   Hypertension Father        and high cholesterol    Hypertension Mother        and high cholesterol    Breast cancer Neg Hx    Social History:  Social History   Substance and Sexual Activity  Alcohol Use No  Comment: rare     Social History   Substance and Sexual Activity  Drug Use No    Social History   Socioeconomic History   Marital status: Divorced    Spouse name: Not on file   Number of children: Not on file   Years of education: Not on file   Highest education level: Not on file  Occupational History   Not on file  Tobacco Use    Smoking status: Never   Smokeless tobacco: Never   Tobacco comments:    tobacco use - no   Vaping Use   Vaping status: Never Used  Substance and Sexual Activity   Alcohol use: No    Comment: rare   Drug use: No   Sexual activity: Not on file  Other Topics Concern   Not on file  Social History Narrative   Single; full time; does not get regular exercise.       Patient is disabled   Social Drivers of Corporate investment banker Strain: Patient Declined (11/14/2022)   Received from [facility]   Overall Financial Resource Strain (CARDIA)    Difficulty of Paying Living Expenses: Patient declined  Food Insecurity: Patient Declined (01/23/2024)   Hunger Vital Sign    Worried About Running Out of Food in the Last Year: Patient declined    Ran Out of Food in the Last Year: Patient declined  Transportation Needs: Patient Declined (01/23/2024)   PRAPARE - Administrator, Civil Service (Medical): Patient declined    Lack of Transportation (Non-Medical): Patient declined  Physical Activity: Not on file  Stress: Not on file  Social Connections: Socially Isolated (01/23/2024)   Social Connection and Isolation Panel    Frequency of Communication with Friends and Family: Three times a week    Frequency of Social Gatherings with Friends and Family: Three times a week    Attends Religious Services: Never    Active Member of Clubs or Organizations: No    Attends Banker Meetings: Never    Marital Status: Separated   Past Medical History:  Past Medical History:  Diagnosis Date   Atrial tachycardia (HCC)    Dysrhythmia    Hx: PSVT. Current: Postural Orthostatic Tachycardia Syndrome, Inappropriate Sinus Node Tachycardia   Headache    migraines, rare.   Kidney stones    Palpitations    [facility] 2/12   Shortness of breath dyspnea    on exertion, secondary to heart issues   Sinus tachycardia    [facility] 2/12   Syncope    secondary to heart issues     Past Surgical History:  Procedure Laterality Date   BREAST BIOPSY Left 05/11/2013   stereo biopsy   CARDIAC ELECTROPHYSIOLOGY STUDY AND ABLATION  04/24/11   [facility]   CLOSED MANIPULATION SHOULDER WITH STERIOD INJECTION Right 02/16/2015   Procedure: CLOSED MANIPULATION SHOULDER WITH STEROID INJECTION;  Surgeon: Norleen JINNY Maltos, MD;  Location: [facility];  Service: Orthopedics;  Laterality: Right;   FINGER SURGERY Right 12/25/2017   explorative surgery on R5th PIP    TONSILLECTOMY      Current Medications: Current Facility-Administered Medications  Medication Dose Route Frequency Provider Last Rate Last Admin   acetaminophen  (TYLENOL ) tablet 650 mg  650 mg Oral Q6H PRN Jadapalle, Sree, MD   650 mg at 01/26/24 2109   alum & mag hydroxide-simeth (MAALOX/MYLANTA) 200-200-20 MG/5ML suspension 30 mL  30 mL Oral Q4H PRN Donnelly Mellow, MD  ARIPiprazole  (ABILIFY ) tablet 2 mg  2 mg Oral Daily Millington, Matthew E, PA-C   2 mg at 01/27/24 9163   atorvastatin  (LIPITOR) tablet 40 mg  40 mg Oral QHS Jadapalle, Sree, MD   40 mg at 01/26/24 2109   conjugated estrogens  (PREMARIN ) vaginal cream 1 Applicatorful  1 Applicatorful Vaginal Q M,W,F Jadapalle, Sree, MD   1 Applicatorful at 01/24/24 2100   cyanocobalamin  (VITAMIN B12) tablet 1,000 mcg  1,000 mcg Oral Daily Jadapalle, Sree, MD   1,000 mcg at 01/27/24 9163   diphenhydrAMINE  (BENADRYL ) capsule 25-50 mg  25-50 mg Oral BID PRN Millington, Matthew E, PA-C   50 mg at 01/26/24 2108   haloperidol  (HALDOL ) tablet 5 mg  5 mg Oral TID PRN Jadapalle, Sree, MD       And   diphenhydrAMINE  (BENADRYL ) capsule 50 mg  50 mg Oral TID PRN Jadapalle, Sree, MD   50 mg at 01/25/24 2200   haloperidol  lactate (HALDOL ) injection 5 mg  5 mg Intramuscular TID PRN Jadapalle, Sree, MD       And   diphenhydrAMINE  (BENADRYL ) injection 50 mg  50 mg Intramuscular TID PRN Jadapalle, Sree, MD       And   LORazepam  (ATIVAN ) injection 2 mg  2 mg Intramuscular  TID PRN Jadapalle, Sree, MD       haloperidol  lactate (HALDOL ) injection 10 mg  10 mg Intramuscular TID PRN Jadapalle, Sree, MD       And   diphenhydrAMINE  (BENADRYL ) injection 50 mg  50 mg Intramuscular TID PRN Jadapalle, Sree, MD       And   LORazepam  (ATIVAN ) injection 2 mg  2 mg Intramuscular TID PRN Jadapalle, Sree, MD       escitalopram  (LEXAPRO ) tablet 10 mg  10 mg Oral Daily Millington, Matthew E, PA-C   10 mg at 01/27/24 9164   fludrocortisone  (FLORINEF ) tablet 100 mcg  100 mcg Oral Daily Jadapalle, Sree, MD   100 mcg at 01/27/24 9163   folic acid  (FOLVITE ) tablet 1 mg  1 mg Oral Daily Jadapalle, Sree, MD   1 mg at 01/27/24 9163   gabapentin  (NEURONTIN ) capsule 1,200 mg  1,200 mg Oral QHS Jadapalle, Sree, MD   1,200 mg at 01/26/24 2108   indomethacin  (INDOCIN ) capsule 50 mg  50 mg Oral BID WC Jadapalle, Sree, MD   50 mg at 01/27/24 9164   ivabradine  (CORLANOR ) tablet 5 mg  5 mg Oral BID Jadapalle, Sree, MD   5 mg at 01/27/24 9163   magnesium  hydroxide (MILK OF MAGNESIA) suspension 30 mL  30 mL Oral Daily PRN Donnelly Mellow, MD       methenamine  (MANDELAMINE) tablet 1,000 mg  1,000 mg Oral BID Millington, Matthew E, PA-C   1,000 mg at 01/27/24 9164   methotrexate  (RHEUMATREX) tablet 15 mg  15 mg Oral Q Fri Jadapalle, Sree, MD   15 mg at 01/24/24 0830   metoprolol  succinate (TOPROL -XL) 24 hr tablet 12.5 mg  12.5 mg Oral Daily Jadapalle, Sree, MD   12.5 mg at 01/27/24 9163   nortriptyline  (PAMELOR ) capsule 10 mg  10 mg Oral QHS Jadapalle, Sree, MD   10 mg at 01/26/24 2109   topiramate  (TOPAMAX ) tablet 50 mg  50 mg Oral TID Jadapalle, Sree, MD   50 mg at 01/27/24 9163    Lab Results:  Results for orders placed or performed during the hospital encounter of 01/23/24 (from the past 48 hours)  Urine Culture (for pregnant, neutropenic or urologic  patients or patients with an indwelling urinary catheter)     Status: Abnormal (Preliminary result)   Collection Time: 01/25/24  1:37 PM    Specimen: Urine, Clean Catch  Result Value Ref Range   Specimen Description      URINE, CLEAN CATCH Performed at [facility], [address], [place], [place] [address]    Special Requests      NONE Performed at [facility], [address] [place], [place] [address]    Culture (A)     50,000 COLONIES/mL ESCHERICHIA COLI SUSCEPTIBILITIES TO FOLLOW Performed at [facility], [address], [place], [place] [address]    Report Status PENDING   Basic metabolic panel with GFR     Status: Abnormal   Collection Time: 01/27/24  7:09 AM  Result Value Ref Range   Sodium 144 135 - 145 mmol/L   Potassium 4.1 3.5 - 5.1 mmol/L   Chloride 111 98 - 111 mmol/L   CO2 25 22 - 32 mmol/L   Glucose, Bld 109 (H) 70 - 99 mg/dL    Comment: Glucose reference range applies only to samples taken after fasting for at least 8 hours.   BUN 31 (H) 6 - 20 mg/dL   Creatinine, Ser 9.15 0.44 - 1.00 mg/dL   Calcium  8.9 8.9 - 10.3 mg/dL   GFR, Estimated >39 >39 mL/min    Comment: (NOTE) Calculated using the CKD-EPI Creatinine Equation (2021)    Anion gap 8 5 - 15    Comment: Performed at [facility], [address] [place], [place] [address]     Blood Alcohol level:  Lab Results  Component Value Date   [facility] <15 01/22/2024    Metabolic Disorder Labs: No results found for: HGBA1C, MPG No results found for: PROLACTIN No results found for: CHOL, TRIG, HDL, CHOLHDL, VLDL, LDLCALC   Psychiatric Specialty Exam: Appearance: Clean, casually dressed for the day Eye Contact: Good Behavior: Calm, redirectable, disorganized at times Speech: Normal rate, rhythm, tone, and volume Mood: Anxious Affect: Congruent with mood Thought Process: Circumstantial with thought blocking Thought Content: No hallucinations, paranoia, or delusions; denies SI/HI Attention: Poor Memory: Immediate memory poor Concentration: Poor Recall: Fair Fund of Knowledge:  Good Language: Good Psychomotor Activity: No agitation or retardation observed Orientation: Fully oriented 3 (person, place, time) Insight: Poor Judgment: Fair to poor  Musculoskeletal: Strength & Muscle Tone: within normal limits Gait & Station: normal Assets  Assets: Housing    Physical Exam: Physical Exam Vitals and nursing note reviewed.  HENT:     Head: Atraumatic.  Eyes:     Extraocular Movements: Extraocular movements intact.  Pulmonary:     Effort: Pulmonary effort is normal.  Neurological:     Mental Status: She is alert and oriented to person, place, and time.    Review of Systems  Genitourinary:  Positive for dysuria.  Psychiatric/Behavioral:  Positive for depression. Negative for hallucinations, substance abuse and suicidal ideas. The patient is nervous/anxious. The patient does not have insomnia.    Blood pressure 110/73, pulse 76, temperature 98.1 F (36.7 C), resp. rate 16, height 5' 6 (1.676 m), weight 56.9 kg, SpO2 98%. Body mass index is 20.26 kg/m.  Diagnosis: Principal Problem:   Bipolar 2 disorder (HCC)   PLAN: Safety and Monitoring:  -- Voluntary admission to inpatient psychiatric unit for safety, stabilization and treatment  -- Daily contact with patient to assess and evaluate symptoms and progress in treatment  -- Patient's case  to be discussed in multi-disciplinary team meeting  -- Observation Level : q15 minute checks  -- Vital signs:  q12 hours  -- Precautions: suicide, elopement, and assault -- Encouraged patient to participate in unit milieu and in scheduled group therapies  2. Psychiatric Diagnoses and Treatment:   Patient is progressing, she did identify the ativan  overdose as a suicide attempt. Pt requires continues psychiatric hospitalization for medication management and stabilization.       Unspecified mood disorder:        - Continue Abilify  2 mg daily - Continue Lexapro  to 10 mg daily, discussed transition to Cymbalta for  chronic pain - Continue nortriptyline  for sleep 10 mg at bedtime - Continue gabapentin  1200 mg nightly which patient reports for sleep and anxiety -Initiate Seroquel  100 mg PO QHS tonight to address disorganization, distress, and poor sleep; evaluate response and consider discontinuing Abilify  if effective   -- The risks/benefits/side-effects/alternatives to this medication were discussed in detail with the patient and time was given for questions. The patient consents to medication trial.                -- Metabolic profile and EKG monitoring obtained while on an atypical antipsychotic (BMI: Lipid Panel: HbgA1c: QTc:)              -- Encouraged patient to participate in unit milieu and in scheduled group therapies                            3. Medical Issues Being Addressed:   home meds reordered as such Atorvastatin  40 mg nightly Premarin  1 application Monday Wednesday Friday Vitamin B12 1000 mcg daily Florinef  100 mcg daily Indomethacin  50 mg twice daily Corlanor  5 mg twice daily Methenamine  1000 mg 4 times daily Metoprolol  12.5 mg daily Methotrexate  15 mg every Friday Topamax  50 mg 3 times daily  Patient was complaining of pain which she believes is a UTI urine was sent for culture.  Spoke with on-call OB GYN Dr. Beverli Schermerhorn sepsis management beneficial for interstitial cystitis includes hydroxyzine  amitriptyline and NSAIDs.  Patient is on hydroxyzine  will try scheduled dose for several days states currently taking nortriptyline  and they are on indomethacin .  If culture is positive we will treat for UTI. Culture pending pt prefers benadryl  to hydroxyzine .   4. Discharge Planning:   -- Social work and case management to assist with discharge planning and identification of hospital follow-up needs prior to discharge  -- Estimated LOS: 5-6  Hoy CHRISTELLA Pinal, NP 01/27/2024, 12:11 PM

## 2024-01-27 NOTE — Plan of Care (Signed)
  Problem: Medication: Goal: Compliance with prescribed medication regimen will improve Outcome: Progressing   

## 2024-01-27 NOTE — Progress Notes (Signed)
   01/27/24 1230  Psych Admission Type (Psych Patients Only)  Admission Status Involuntary  Psychosocial Assessment  Patient Complaints Insomnia (patient states I'm fine, if it wasn't for the pain I'd be ok; patient also reports that I haven't slept since I've been here.)  Eye Contact Fair  Facial Expression Anxious;Worried  Affect Anxious;Preoccupied (preoccupied with her son and mom and all of the tasks that have to be completed.)  Speech Logical/coherent  Interaction Assertive  Motor Activity Slow  Appearance/Hygiene Layered clothes;Unremarkable  Behavior Characteristics Cooperative;Appropriate to situation  Mood Anxious;Pleasant  Aggressive Behavior  Effect No apparent injury  Thought Process  Coherency WDL  Content WDL  Delusions None reported or observed  Perception WDL  Hallucination None reported or observed  Judgment WDL  Confusion None  Danger to Self  Current suicidal ideation? Denies  Agreement Not to Harm Self Yes  Description of Agreement Verbal  Danger to Others  Danger to Others None reported or observed   Patient's goal for today, per her self-inventory is I have apologized to Ayden for hurting him, now I need to work on forgiving myself, in which she will know that some goals take longer than others in order to achieve her goal.

## 2024-01-27 NOTE — Plan of Care (Signed)
  Problem: Education: Goal: Knowledge of [facility] Education information/materials will improve Outcome: Progressing Goal: Emotional status will improve Outcome: Progressing Goal: Mental status will improve Outcome: Progressing Goal: Verbalization of understanding the information provided will improve Outcome: Progressing   Problem: Activity: Goal: Interest or engagement in activities will improve Outcome: Progressing Goal: Sleeping patterns will improve Outcome: Progressing   Problem: Coping: Goal: Ability to verbalize frustrations and anger appropriately will improve Outcome: Progressing Goal: Ability to demonstrate self-control will improve Outcome: Progressing   Problem: Health Behavior/Discharge Planning: Goal: Identification of resources available to assist in meeting health care needs will improve Outcome: Progressing Goal: Compliance with treatment plan for underlying cause of condition will improve Outcome: Progressing   Problem: Physical Regulation: Goal: Ability to maintain clinical measurements within normal limits will improve Outcome: Progressing   Problem: Safety: Goal: Periods of time without injury will increase Outcome: Progressing   Problem: Education: Goal: Ability to state activities that reduce stress will improve Outcome: Progressing   Problem: Coping: Goal: Ability to identify and develop effective coping behavior will improve Outcome: Progressing   Problem: Self-Concept: Goal: Ability to identify factors that promote anxiety will improve Outcome: Progressing Goal: Level of anxiety will decrease Outcome: Progressing Goal: Ability to modify response to factors that promote anxiety will improve Outcome: Progressing   Problem: Education: Goal: Utilization of techniques to improve thought processes will improve Outcome: Progressing Goal: Knowledge of the prescribed therapeutic regimen will improve Outcome: Progressing   Problem:  Activity: Goal: Interest or engagement in leisure activities will improve Outcome: Progressing Goal: Imbalance in normal sleep/wake cycle will improve Outcome: Progressing   Problem: Coping: Goal: Coping ability will improve Outcome: Progressing Goal: Will verbalize feelings Outcome: Progressing   Problem: Health Behavior/Discharge Planning: Goal: Ability to make decisions will improve Outcome: Progressing Goal: Compliance with therapeutic regimen will improve Outcome: Progressing   Problem: Role Relationship: Goal: Will demonstrate positive changes in social behaviors and relationships Outcome: Progressing   Problem: Safety: Goal: Ability to disclose and discuss suicidal ideas will improve Outcome: Progressing Goal: Ability to identify and utilize support systems that promote safety will improve Outcome: Progressing   Problem: Self-Concept: Goal: Will verbalize positive feelings about self Outcome: Progressing Goal: Level of anxiety will decrease Outcome: Progressing   Problem: Education: Goal: Ability to make informed decisions regarding treatment will improve Outcome: Progressing   Problem: Coping: Goal: Coping ability will improve Outcome: Progressing   Problem: Health Behavior/Discharge Planning: Goal: Identification of resources available to assist in meeting health care needs will improve Outcome: Progressing   Problem: Medication: Goal: Compliance with prescribed medication regimen will improve Outcome: Progressing   Problem: Self-Concept: Goal: Ability to disclose and discuss suicidal ideas will improve Outcome: Progressing Goal: Will verbalize positive feelings about self Outcome: Progressing Note: Patient is on track. Patient will maintain adherence and work on increased adherence

## 2024-01-27 NOTE — Group Note (Signed)
 Recreation Therapy Group Note   Group Topic:Coping Skills  Group Date: 01/27/2024 Start Time: 1035 End Time: 1140 Facilitators: Celestia Jeoffrey BRAVO, LRT, CTRS Location: Craft Room  Group Description: Leisure. Patients were given the option to choose from singing karaoke, coloring mandalas, using oil pastels, journaling, painting or playing with play-doh. LRT and pts discussed the meaning of leisure, the importance of participating in leisure during their free time/when they're outside of the hospital, as well as how our leisure interests can also serve as coping skills.   Goal Area(s) Addressed:  Patient will identify a current leisure interest.  Patient will learn the definition of "leisure". Patient will practice making a positive decision. Patient will have the opportunity to try a new leisure activity. Patient will communicate with peers and LRT.    Affect/Mood: Appropriate   Participation Level: Active and Engaged   Participation Quality: Independent   Behavior: Appropriate, Calm, and Cooperative   Speech/Thought Process: Coherent   Insight: Good   Judgement: Good   Modes of Intervention: Activity, Music, Rapport Building, and Socialization   Patient Response to Interventions:  Attentive, Engaged, Interested , and Receptive   Education Outcome:  Acknowledges education   Clinical Observations/Individualized Feedback: Lisa Clay was active in their participation of session activities and group discussion. Pt chose to color while in group. Pt interacted well with LRT and peers duration of session.    Plan: Continue to engage patient in RT group sessions 2-3x/week.   Jeoffrey BRAVO Celestia, LRT, CTRS 01/27/2024 1:58 PM

## 2024-01-27 NOTE — Progress Notes (Signed)
   01/26/24 2109  Psych Admission Type (Psych Patients Only)  Admission Status Voluntary  Psychosocial Assessment  Patient Complaints Anxiety;Depression  Eye Contact Fair  Facial Expression Animated  Affect Anxious  Speech Logical/coherent  Interaction Assertive  Motor Activity Slow  Appearance/Hygiene Unremarkable  Behavior Characteristics Cooperative  Mood Anxious  Thought Process  Coherency WDL  Content WDL  Delusions None reported or observed  Perception WDL  Hallucination None reported or observed  Judgment Limited  Confusion None  Danger to Self  Current suicidal ideation? Denies  Agreement Not to Harm Self Yes  Description of Agreement verbal  Danger to Others  Danger to Others None reported or observed

## 2024-01-27 NOTE — Group Note (Signed)
 Date:  01/27/2024 Time:  4:33 PM  Group Topic/Focus:  Healthy Communication:   The focus of this group is to discuss communication, barriers to communication, as well as healthy ways to communicate with others.    Participation Level:  Active  Participation Quality:  Appropriate  Affect:  Appropriate  Cognitive:  Alert  Insight: Appropriate  Engagement in Group:  Engaged  Modes of Intervention:  Activity, Discussion, and Education  Additional Comments:    Skippy LITTIE Bennett 01/27/2024, 4:33 PM

## 2024-01-27 NOTE — Group Note (Signed)
 Date:  01/27/2024 Time:  10:21 AM  Group Topic/Focus:  Goals Group:   The focus of this group is to help patients establish daily goals to achieve during treatment and discuss how the patient can incorporate goal setting into their daily lives to aide in recovery.    Participation Level:  Active  Participation Quality:  Appropriate  Affect:  Appropriate  Cognitive:  Alert  Insight: Appropriate  Engagement in Group:  Engaged  Modes of Intervention:  Activity, Discussion, and Education  Additional Comments:    Skippy LITTIE Bennett 01/27/2024, 10:21 AM

## 2024-01-28 LAB — URINE CULTURE: Culture: 50000 — AB

## 2024-01-28 MED ORDER — PHENAZOPYRIDINE HCL 100 MG PO TABS
100.0000 mg | ORAL_TABLET | Freq: Three times a day (TID) | ORAL | Status: DC | PRN
  Administered 2024-01-28 – 2024-01-31 (×9): 100 mg via ORAL
  Filled 2024-01-28 (×11): qty 1

## 2024-01-28 MED ORDER — SULFAMETHOXAZOLE-TRIMETHOPRIM 800-160 MG PO TABS
1.0000 | ORAL_TABLET | Freq: Two times a day (BID) | ORAL | Status: AC
  Administered 2024-01-28 – 2024-01-30 (×6): 1 via ORAL
  Filled 2024-01-28 (×6): qty 1

## 2024-01-28 NOTE — Group Note (Signed)
 Date:  01/28/2024 Time:  8:19 PM  Group Topic/Focus:  Orientation:   The focus of this group is to educate the patient on the purpose and policies of crisis stabilization and provide a format to answer questions about their admission.  The group details unit policies and expectations of patients while admitted.    Participation Level:  Active  Participation Quality:  Appropriate, Attentive, and Sharing  Affect:  Appropriate  Cognitive:  Alert and Appropriate  Insight: Appropriate and Good  Engagement in Group:  Engaged and Improving  Modes of Intervention:  Clarification, Discussion, Education, Orientation, Rapport Building, and Support  Additional Comments:     Lesha Jager 01/28/2024, 8:19 PM

## 2024-01-28 NOTE — Progress Notes (Signed)
   01/28/24 1100  Psych Admission Type (Psych Patients Only)  Admission Status Involuntary  Psychosocial Assessment  Patient Complaints Anxiety  Eye Contact Fair  Facial Expression Anxious  Affect Appropriate to circumstance  Speech Logical/coherent  Interaction Assertive;Intrusive  Motor Activity Slow  Appearance/Hygiene Unremarkable  Behavior Characteristics Cooperative  Mood Anxious;Pleasant  Thought Process  Coherency WDL  Content WDL  Delusions None reported or observed  Perception WDL  Hallucination None reported or observed  Judgment Impaired  Confusion None  Danger to Self  Current suicidal ideation? Denies  Agreement Not to Harm Self Yes  Description of Agreement verbal   Patient stated that she had a better sleep last night. Patient c/o lower abdominal spasm and unable to walk. Assisted patient walk to bed and made comfortable. PRN medications given. Patient states  I can walk upright now.

## 2024-01-28 NOTE — Plan of Care (Signed)
   Problem: Education: Goal: Emotional status will improve Outcome: Progressing Goal: Mental status will improve Outcome: Progressing

## 2024-01-28 NOTE — Group Note (Signed)
 Date:  01/28/2024 Time:  2:18 PM  Group Topic/Focus:  Building Self Esteem:   The Focus of this group is helping patients become aware of the effects of self-esteem on their lives, the things they and others do that enhance or undermine their self-esteem, seeing the relationship between their level of self-esteem and the choices they make and learning ways to enhance self-esteem. Goals Group:   The focus of this group is to help patients establish daily goals to achieve during treatment and discuss how the patient can incorporate goal setting into their daily lives to aide in recovery. Self Care:   The focus of this group is to help patients understand the importance of self-care in order to improve or restore emotional, physical, spiritual, interpersonal, and financial health.    Participation Level:  Active  Participation Quality:  Appropriate  Affect:  Appropriate  Cognitive:  Appropriate and Oriented  Insight: Appropriate  Engagement in Group:  Engaged  Modes of Intervention:  Discussion and Support  Additional Comments:  N/A  Butler LITTIE Gelineau 01/28/2024, 2:18 PM

## 2024-01-28 NOTE — Group Note (Signed)
 Recreation Therapy Group Note   Group Topic:Animal Assisted Therapy   Group Date: 01/28/2024 Start Time: 1000 End Time: 1035 Facilitators: Celestia Jeoffrey BRAVO, LRT, CTRS Location: Dayroom  Group Description: AAA. Animal-Assisted Activity provides opportunities for motivational, educational, therapeutic and/or recreational benefits to enhance quality of life. [name] and Rollo visited the unit to interact with patients.    Goal Areas Addressed:  Reduced anxiety and stress Improved mood Increased social interaction Enhanced communication skills Reduced loneliness and isolation Improved emotional regulation    Affect/Mood: Appropriate   Participation Level: Active    Clinical Observations/Individualized Feedback: Lisa Clay interacted well with LRT and peers duration of session.   Plan: Continue to engage patient in RT group sessions 2-3x/week.   Jeoffrey BRAVO Celestia, LRT, CTRS 01/28/2024 1:11 PM

## 2024-01-28 NOTE — Progress Notes (Signed)
   01/28/24 2300  Psychosocial Assessment  Patient Complaints Anxiety  Eye Contact Fair  Facial Expression Anxious  Affect Appropriate to circumstance  Speech Logical/coherent  Interaction Assertive  Motor Activity Slow;Other (Comment) (discomfort from UTI)  Appearance/Hygiene Unremarkable  Behavior Characteristics Cooperative  Mood Anxious;Pleasant  Thought Process  Coherency WDL  Content WDL  Delusions None reported or observed  Perception WDL  Hallucination None reported or observed  Judgment WDL  Confusion None  Danger to Self  Current suicidal ideation? 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  She reported working one other job before she had no choice but to stop.  She gave this Clinical research associate a detailed information regarding her urinary issues.    This Clinical research associate observed the patient giving nursing advice to other patients.

## 2024-01-28 NOTE — Progress Notes (Signed)
 [facility] MD Progress Note  01/28/2024 3:27 PM Lisa Clay  MRN:  [id number]   Subjective:  Chart reviewed, case discussed in multidisciplinary meeting, patient seen during rounds.   Chart reviewed. Patient seen today for follow-up psychiatric evaluation. Day characterized by significant physical pain related to urinary tract infection. Cultures reflect need to change antibiotic to better target the identified strain. This was reviewed with pharmacy and the attending psychiatrist; Bactrim , DS was ordered, and Pyridium  was added PRN for pain relief.  At bedside, patient reports, "I slept" and states sleep was much better last night with Seroquel . She denies suicidal ideation, homicidal ideation, hallucinations, paranoia, or delusions. Appetite remains stable. She reports history of three rounds of antibiotics prescribed by her urogynecologist to treat UTI: initially Keflex , followed by another course of Keflex , then a 10-day course of Cipro , all of which she reports completing.  She identifies that her mother has had good mental health response to Seroquel  and Paxil which is reassuring. She denies medication side effects.    8/18: Patient seen today for follow-up psychiatric evaluation. Following introductions and education related to the purpose of this interview, she reviewed symptoms leading to this admission. She reports not remembering much but admits to taking "too much medication" and posting "peace out" on Facebook. She describes taking eight Ativan  and eight of another medication. Patient identifies the primary trigger as discord with her children, noting an argument with her 61 year old son that escalated and describing her 47 year old daughter as "toxic."  She denies any prior psychiatric treatment. Patient reports a history of suicidal ideation as a ninth grader due to uninvolved parents but denies past self-harm activity. Current stressors include her mother's dementia diagnosis.  She endorses feeling "brain fog," with disorganization and thought blocking observed during the interview. She reports poor sleep, typically staying up until 4 a.m. and waking before breakfast, leaving her tired during the day. She denies auditory or visual hallucinations, paranoia, or delusions. She denies suicidal or homicidal ideation at this time. She denies medication side effects.She reports mood anxious and worried.   Sleep: poor  Appetite:  Fair  Past Psychiatric History: see h&P Family History:  Family History  Problem Relation Age of Onset   Heart attack Maternal Grandmother 50       deceased   Hypertension Father        and high cholesterol    Hypertension Mother        and high cholesterol    Breast cancer Neg Hx    Social History:  Social History   Substance and Sexual Activity  Alcohol Use No   Comment: rare     Social History   Substance and Sexual Activity  Drug Use No    Social History   Socioeconomic History   Marital status: Divorced    Spouse name: Not on file   Number of children: Not on file   Years of education: Not on file   Highest education level: Not on file  Occupational History   Not on file  Tobacco Use   Smoking status: Never   Smokeless tobacco: Never   Tobacco comments:    tobacco use - no   Vaping Use   Vaping status: Never Used  Substance and Sexual Activity   Alcohol use: No    Comment: rare   Drug use: No   Sexual activity: Not on file  Other Topics Concern   Not on file  Social History Narrative   Single; full time;  does not get regular exercise.       Patient is disabled   Social Drivers of Corporate investment banker Strain: Patient Declined (11/14/2022)   Received from [facility]   Overall Financial Resource Strain (CARDIA)    Difficulty of Paying Living Expenses: Patient declined  Food Insecurity: Patient Declined (01/23/2024)   Hunger Vital Sign    Worried About Running Out of Food in the  Last Year: Patient declined    Ran Out of Food in the Last Year: Patient declined  Transportation Needs: Patient Declined (01/23/2024)   PRAPARE - Administrator, Civil Service (Medical): Patient declined    Lack of Transportation (Non-Medical): Patient declined  Physical Activity: Not on file  Stress: Not on file  Social Connections: Socially Isolated (01/23/2024)   Social Connection and Isolation Panel    Frequency of Communication with Friends and Family: Three times a week    Frequency of Social Gatherings with Friends and Family: Three times a week    Attends Religious Services: Never    Active Member of Clubs or Organizations: No    Attends Banker Meetings: Never    Marital Status: Separated   Past Medical History:  Past Medical History:  Diagnosis Date   Atrial tachycardia (HCC)    Dysrhythmia    Hx: PSVT. Current: Postural Orthostatic Tachycardia Syndrome, Inappropriate Sinus Node Tachycardia   Headache    migraines, rare.   Kidney stones    Palpitations    [facility] 2/12   Shortness of breath dyspnea    on exertion, secondary to heart issues   Sinus tachycardia    [facility] 2/12   Syncope    secondary to heart issues    Past Surgical History:  Procedure Laterality Date   BREAST BIOPSY Left 05/11/2013   stereo biopsy   CARDIAC ELECTROPHYSIOLOGY STUDY AND ABLATION  04/24/11   [facility]   CLOSED MANIPULATION SHOULDER WITH STERIOD INJECTION Right 02/16/2015   Procedure: CLOSED MANIPULATION SHOULDER WITH STEROID INJECTION;  Surgeon: Norleen JINNY Maltos, MD;  Location: [facility];  Service: Orthopedics;  Laterality: Right;   FINGER SURGERY Right 12/25/2017   explorative surgery on R5th PIP    TONSILLECTOMY      Current Medications: Current Facility-Administered Medications  Medication Dose Route Frequency Provider Last Rate Last Admin   acetaminophen  (TYLENOL ) tablet 650 mg  650 mg Oral Q6H PRN Jadapalle, Sree, MD   650 mg at 01/26/24 2109    alum & mag hydroxide-simeth (MAALOX/MYLANTA) 200-200-20 MG/5ML suspension 30 mL  30 mL Oral Q4H PRN Jadapalle, Sree, MD       ARIPiprazole  (ABILIFY ) tablet 2 mg  2 mg Oral Daily Millington, Matthew E, PA-C   2 mg at 01/28/24 0900   atorvastatin  (LIPITOR) tablet 40 mg  40 mg Oral QHS Jadapalle, Sree, MD   40 mg at 01/27/24 2059   conjugated estrogens  (PREMARIN ) vaginal cream 1 Applicatorful  1 Applicatorful Vaginal Q M,W,F Jadapalle, Sree, MD   1 Applicatorful at 01/24/24 2100   cyanocobalamin  (VITAMIN B12) tablet 1,000 mcg  1,000 mcg Oral Daily Jadapalle, Sree, MD   1,000 mcg at 01/28/24 0900   diphenhydrAMINE  (BENADRYL ) capsule 25-50 mg  25-50 mg Oral BID PRN Millington, Matthew E, PA-C   25 mg at 01/27/24 1236   haloperidol  (HALDOL ) tablet 5 mg  5 mg Oral TID PRN Jadapalle, Sree, MD       And   diphenhydrAMINE  (BENADRYL ) capsule 50 mg  50 mg Oral TID PRN Jadapalle, Sree, MD   50 mg at 01/25/24 2200   haloperidol  lactate (HALDOL ) injection 5 mg  5 mg Intramuscular TID PRN Jadapalle, Sree, MD       And   diphenhydrAMINE  (BENADRYL ) injection 50 mg  50 mg Intramuscular TID PRN Jadapalle, Sree, MD       And   LORazepam  (ATIVAN ) injection 2 mg  2 mg Intramuscular TID PRN Jadapalle, Sree, MD       haloperidol  lactate (HALDOL ) injection 10 mg  10 mg Intramuscular TID PRN Jadapalle, Sree, MD       And   diphenhydrAMINE  (BENADRYL ) injection 50 mg  50 mg Intramuscular TID PRN Jadapalle, Sree, MD       And   LORazepam  (ATIVAN ) injection 2 mg  2 mg Intramuscular TID PRN Jadapalle, Sree, MD       escitalopram  (LEXAPRO ) tablet 10 mg  10 mg Oral Daily Millington, Matthew E, PA-C   10 mg at 01/28/24 0900   fludrocortisone  (FLORINEF ) tablet 100 mcg  100 mcg Oral Daily Jadapalle, Sree, MD   100 mcg at 01/28/24 0900   folic acid  (FOLVITE ) tablet 1 mg  1 mg Oral Daily Jadapalle, Sree, MD   1 mg at 01/28/24 0900   gabapentin  (NEURONTIN ) capsule 1,200 mg  1,200 mg Oral QHS Jadapalle, Sree, MD   1,200 mg at  01/27/24 2100   indomethacin  (INDOCIN ) capsule 50 mg  50 mg Oral BID WC Jadapalle, Sree, MD   50 mg at 01/28/24 0900   ivabradine  (CORLANOR ) tablet 5 mg  5 mg Oral BID Jadapalle, Sree, MD   5 mg at 01/28/24 0900   magnesium  hydroxide (MILK OF MAGNESIA) suspension 30 mL  30 mL Oral Daily PRN Donnelly Mellow, MD       methenamine  (MANDELAMINE) tablet 1,000 mg  1,000 mg Oral BID Millington, Matthew E, PA-C   1,000 mg at 01/27/24 9164   methotrexate  (RHEUMATREX) tablet 15 mg  15 mg Oral Q Fri Jadapalle, Sree, MD   15 mg at 01/24/24 0830   metoprolol  succinate (TOPROL -XL) 24 hr tablet 12.5 mg  12.5 mg Oral Daily Jadapalle, Sree, MD   12.5 mg at 01/28/24 9140   nortriptyline  (PAMELOR ) capsule 10 mg  10 mg Oral QHS Jadapalle, Sree, MD   10 mg at 01/27/24 2100   phenazopyridine  (PYRIDIUM ) tablet 100 mg  100 mg Oral TID PRN Cleotilde Lisa HERO, NP   100 mg at 01/28/24 1216   QUEtiapine  (SEROQUEL ) tablet 100 mg  100 mg Oral QHS Cleotilde Lisa HERO, NP   100 mg at 01/27/24 2142   sulfamethoxazole -trimethoprim  (BACTRIM  DS) 800-160 MG per tablet 1 tablet  1 tablet Oral Q12H Jadapalle, Sree, MD   1 tablet at 01/28/24 1300   topiramate  (TOPAMAX ) tablet 50 mg  50 mg Oral TID Jadapalle, Sree, MD   50 mg at 01/28/24 1300    Lab Results:  Results for orders placed or performed during the hospital encounter of 01/23/24 (from the past 48 hours)  Basic metabolic panel with GFR     Status: Abnormal   Collection Time: 01/27/24  7:09 AM  Result Value Ref Range   Sodium 144 135 - 145 mmol/L   Potassium 4.1 3.5 - 5.1 mmol/L   Chloride 111 98 - 111 mmol/L   CO2 25 22 - 32 mmol/L   Glucose, Bld 109 (H) 70 - 99 mg/dL    Comment: Glucose reference range applies only to samples taken after fasting  for at least 8 hours.   BUN 31 (H) 6 - 20 mg/dL   Creatinine, Ser 9.15 0.44 - 1.00 mg/dL   Calcium  8.9 8.9 - 10.3 mg/dL   GFR, Estimated >39 >39 mL/min    Comment: (NOTE) Calculated using the CKD-EPI Creatinine Equation  (2021)    Anion gap 8 5 - 15    Comment: Performed at [facility], [address] [place], [place] [address]     Blood Alcohol level:  Lab Results  Component Value Date   [facility] <15 01/22/2024    Metabolic Disorder Labs: No results found for: HGBA1C, MPG No results found for: PROLACTIN No results found for: CHOL, TRIG, HDL, CHOLHDL, VLDL, LDLCALC   Psychiatric Specialty Exam: Appearance: Clean, casually dressed for the day Eye Contact: Good Behavior: Calm Speech: Normal rate, rhythm, tone, and volume Mood: resting anxious related to pain which is being treated  Affect: Congruent Thought Process: linear Thought Content: No hallucinations, paranoia, or delusions; denies SI/HI Attention: good Memory: good Concentration: good Recall: good Fund of Knowledge: Good Language: Good Psychomotor Activity: No agitation or retardation observed Orientation: Fully oriented 3 (person, place, time) Insight: Poor-improving  Judgment: Fair to poor  Musculoskeletal: Strength & Muscle Tone: within normal limits Gait & Station: normal Assets  Assets: Housing    Physical Exam: Physical Exam Vitals and nursing note reviewed.  HENT:     Head: Atraumatic.  Eyes:     Extraocular Movements: Extraocular movements intact.  Pulmonary:     Effort: Pulmonary effort is normal.  Neurological:     Mental Status: She is alert and oriented to person, place, and time.    Review of Systems  Genitourinary:  Positive for dysuria.  Psychiatric/Behavioral:  Positive for depression. Negative for hallucinations, substance abuse and suicidal ideas. The patient is nervous/anxious. The patient does not have insomnia.    Blood pressure 104/64, pulse 77, temperature 98 F (36.7 C), resp. rate 18, height 5' 6 (1.676 m), weight 56.9 kg, SpO2 98%. Body mass index is 20.26 kg/m.  Diagnosis: Principal Problem:   Bipolar 2 disorder (HCC)   PLAN: Safety and  Monitoring:  -- Voluntary admission to inpatient psychiatric unit for safety, stabilization and treatment  -- Daily contact with patient to assess and evaluate symptoms and progress in treatment  -- Patient's case to be discussed in multi-disciplinary team meeting  -- Observation Level : q15 minute checks  -- Vital signs:  q12 hours  -- Precautions: suicide, elopement, and assault -- Encouraged patient to participate in unit milieu and in scheduled group therapies  2. Psychiatric Diagnoses and Treatment:   Patient is progressing, she did identify the ativan  overdose as a suicide attempt. Pt requires continues psychiatric hospitalization for medication management and stabilization.   Patient presents with some improved psychiatric stability today, likely supported by improved rest. She reports better sleep with Seroquel  and appears more linear and appropriate during evaluation. Abilify  was discontinued due to concern it may not have been beneficial for her clinical course. Cultures confirmed resistant urinary tract infection, requiring change of antibiotics; Factor DS was initiated, and Pyridium  was added PRN for pain. Patient is reassured by parallels between her mother's mental health stability on Seroquel  and Paxil, and she is tolerating these medications well. No evidence of disorganization observed today. Patient continues to require this acute inpatient psychiatric setting for medical and psychiatric stabilization.  Consult hospitalist for input regarding treatment-resistant UTI and confirm appropriateness of antibiotic management  Unspecified mood disorder:        -DC Abilify  2 mg daily - Continue Lexapro  to 10 mg daily, discussed transition to Cymbalta for chronic pain - Continue nortriptyline  for sleep 10 mg at bedtime - Continue gabapentin  1200 mg nightly which patient reports for sleep and anxiety -Initiate Seroquel  100 mg PO QHS tonight to address disorganization, distress, and  poor sleep; evaluate response and consider discontinuing Abilify  if effective   -- The risks/benefits/side-effects/alternatives to this medication were discussed in detail with the patient and time was given for questions. The patient consents to medication trial.                -- Metabolic profile and EKG monitoring obtained while on an atypical antipsychotic (BMI: Lipid Panel: HbgA1c: QTc:)              -- Encouraged patient to participate in unit milieu and in scheduled group therapies                            3. Medical Issues Being Addressed:   home meds reordered as such Atorvastatin  40 mg nightly Premarin  1 application Monday Wednesday Friday Vitamin B12 1000 mcg daily Florinef  100 mcg daily Indomethacin  50 mg twice daily Corlanor  5 mg twice daily Methenamine  1000 mg 4 times daily Metoprolol  12.5 mg daily Methotrexate  15 mg every Friday Topamax  50 mg 3 times daily  Patient was complaining of pain which she believes is a UTI urine was sent for culture.  Spoke with on-call OB GYN Dr. Beverli Schermerhorn sepsis management beneficial for interstitial cystitis includes hydroxyzine  amitriptyline and NSAIDs.  Patient is on hydroxyzine  will try scheduled dose for several days states currently taking nortriptyline  and they are on indomethacin .  If culture is positive we will treat for UTI. Culture pending pt prefers benadryl  to hydroxyzine .   4. Discharge Planning:   -- Social work and case management to assist with discharge planning and identification of hospital follow-up needs prior to discharge  -- Estimated LOS: 5-6  Lisa CHRISTELLA Pinal, NP 01/28/2024, 3:27 PM

## 2024-01-28 NOTE — Plan of Care (Signed)
  Problem: Education: Goal: Mental status will improve Outcome: Progressing   Problem: Activity: Goal: Interest or engagement in activities will improve Outcome: Progressing   Problem: Physical Regulation: Goal: Ability to maintain clinical measurements within normal limits will improve Outcome: Progressing   Problem: Safety: Goal: Periods of time without injury will increase Outcome: Progressing   Problem: Coping: Goal: Ability to identify and develop effective coping behavior will improve Outcome: Progressing

## 2024-01-28 NOTE — Plan of Care (Signed)
  Problem: Education: Goal: Mental status will improve Outcome: Progressing   Problem: Activity: Goal: Sleeping patterns will improve Outcome: Progressing   Problem: Physical Regulation: Goal: Ability to maintain clinical measurements within normal limits will improve Outcome: Progressing   Problem: Activity: Goal: Interest or engagement in leisure activities will improve Outcome: Progressing   Problem: Coping: Goal: Coping ability will improve Outcome: Progressing Goal: Will verbalize feelings Outcome: Progressing   Problem: Health Behavior/Discharge Planning: Goal: Compliance with therapeutic regimen will improve Outcome: Progressing   Problem: Role Relationship: Goal: Will demonstrate positive changes in social behaviors and relationships Outcome: Progressing   Problem: Medication: Goal: Compliance with prescribed medication regimen will improve Outcome: Progressing

## 2024-01-28 NOTE — Group Note (Signed)
 LCSW Group Therapy Note   Group Date: 01/28/2024 Start Time: 1250 End Time: 1350   Type of Therapy and Topic:  Group Therapy: Challenging Core Beliefs  Participation Level:  Did Not Attend  Description of Group:  Patients were educated about core beliefs and asked to identify one harmful core belief that they have. Patients were asked to explore from where those beliefs originate. Patients were asked to discuss how those beliefs make them feel and the resulting behaviors of those beliefs. They were then be asked if those beliefs are true and, if so, what evidence they have to support them. Lastly, group members were challenged to replace those negative core beliefs with helpful beliefs.   Therapeutic Goals:   1. Patient will identify harmful core beliefs and explore the origins of such beliefs. 2. Patient will identify feelings and behaviors that result from those core beliefs. 3. Patient will discuss whether such beliefs are true. 4.  Patient will replace harmful core beliefs with helpful ones.  Summary of Patient Progress:  Patient did not attend.   Therapeutic Modalities: Cognitive Behavioral Therapy; Solution-Focused Therapy   Riya Huxford M Brenan [place], LCSWA 01/28/2024  1:52 PM

## 2024-01-29 NOTE — Plan of Care (Signed)

## 2024-01-29 NOTE — BH IP Treatment Plan (Signed)
 Interdisciplinary Treatment and Diagnostic Plan Update  01/29/2024 Time of Session: 9:00 AM Lisa Clay MRN: [id number]  Principal Diagnosis: Bipolar 2 disorder (HCC)  Secondary Diagnoses: Principal Problem:   Bipolar 2 disorder (HCC)   Current Medications:  Current Facility-Administered Medications  Medication Dose Route Frequency Provider Last Rate Last Admin   acetaminophen  (TYLENOL ) tablet 650 mg  650 mg Oral Q6H PRN Jadapalle, Sree, MD   650 mg at 01/26/24 2109   alum & mag hydroxide-simeth (MAALOX/MYLANTA) 200-200-20 MG/5ML suspension 30 mL  30 mL Oral Q4H PRN Jadapalle, Sree, MD       atorvastatin  (LIPITOR) tablet 40 mg  40 mg Oral QHS Jadapalle, Sree, MD   40 mg at 01/28/24 2120   conjugated estrogens  (PREMARIN ) vaginal cream 1 Applicatorful  1 Applicatorful Vaginal Q M,W,F Jadapalle, Sree, MD   1 Applicatorful at 01/24/24 2100   cyanocobalamin  (VITAMIN B12) tablet 1,000 mcg  1,000 mcg Oral Daily Jadapalle, Sree, MD   1,000 mcg at 01/29/24 0840   diphenhydrAMINE  (BENADRYL ) capsule 25-50 mg  25-50 mg Oral BID PRN Millington, Matthew E, PA-C   25 mg at 01/27/24 1236   haloperidol  (HALDOL ) tablet 5 mg  5 mg Oral TID PRN Jadapalle, Sree, MD       And   diphenhydrAMINE  (BENADRYL ) capsule 50 mg  50 mg Oral TID PRN Jadapalle, Sree, MD   50 mg at 01/25/24 2200   haloperidol  lactate (HALDOL ) injection 5 mg  5 mg Intramuscular TID PRN Jadapalle, Sree, MD       And   diphenhydrAMINE  (BENADRYL ) injection 50 mg  50 mg Intramuscular TID PRN Jadapalle, Sree, MD       And   LORazepam  (ATIVAN ) injection 2 mg  2 mg Intramuscular TID PRN Jadapalle, Sree, MD       haloperidol  lactate (HALDOL ) injection 10 mg  10 mg Intramuscular TID PRN Jadapalle, Sree, MD       And   diphenhydrAMINE  (BENADRYL ) injection 50 mg  50 mg Intramuscular TID PRN Jadapalle, Sree, MD       And   LORazepam  (ATIVAN ) injection 2 mg  2 mg Intramuscular TID PRN Jadapalle, Sree, MD       escitalopram  (LEXAPRO )  tablet 10 mg  10 mg Oral Daily Millington, Matthew E, PA-C   10 mg at 01/29/24 0840   fludrocortisone  (FLORINEF ) tablet 100 mcg  100 mcg Oral Daily Jadapalle, Sree, MD   100 mcg at 01/29/24 0840   folic acid  (FOLVITE ) tablet 1 mg  1 mg Oral Daily Jadapalle, Sree, MD   1 mg at 01/29/24 0840   gabapentin  (NEURONTIN ) capsule 1,200 mg  1,200 mg Oral QHS Jadapalle, Sree, MD   1,200 mg at 01/28/24 2120   indomethacin  (INDOCIN ) capsule 50 mg  50 mg Oral BID WC Jadapalle, Sree, MD   50 mg at 01/29/24 9158   ivabradine  (CORLANOR ) tablet 5 mg  5 mg Oral BID Jadapalle, Sree, MD   5 mg at 01/29/24 0840   magnesium  hydroxide (MILK OF MAGNESIA) suspension 30 mL  30 mL Oral Daily PRN Donnelly Mellow, MD       methenamine  (MANDELAMINE) tablet 1,000 mg  1,000 mg Oral BID Millington, Matthew E, PA-C   1,000 mg at 01/27/24 0835   methotrexate  (RHEUMATREX) tablet 15 mg  15 mg Oral Q Fri Jadapalle, Sree, MD   15 mg at 01/24/24 0830   metoprolol  succinate (TOPROL -XL) 24 hr tablet 12.5 mg  12.5 mg Oral Daily Jadapalle, Sree, MD  12.5 mg at 01/29/24 0841   nortriptyline  (PAMELOR ) capsule 10 mg  10 mg Oral QHS Jadapalle, Sree, MD   10 mg at 01/28/24 2120   phenazopyridine  (PYRIDIUM ) tablet 100 mg  100 mg Oral TID PRN Cleotilde Hoy HERO, NP   100 mg at 01/29/24 1215   QUEtiapine  (SEROQUEL ) tablet 100 mg  100 mg Oral QHS Cleotilde Hoy HERO, NP   100 mg at 01/28/24 2120   sulfamethoxazole -trimethoprim  (BACTRIM  DS) 800-160 MG per tablet 1 tablet  1 tablet Oral Q12H Jadapalle, Sree, MD   1 tablet at 01/29/24 9158   topiramate  (TOPAMAX ) tablet 50 mg  50 mg Oral TID Jadapalle, Sree, MD   50 mg at 01/29/24 1215   PTA Medications: Medications Prior to Admission  Medication Sig Dispense Refill Last Dose/Taking   atorvastatin  (LIPITOR) 40 MG tablet Take 1 tablet by mouth daily.      conjugated estrogens  (PREMARIN ) vaginal cream Discard applicator and Apply pea sized amount to tip of finger to urethra before bed. Wash hands well  after application. Use Monday, Wednesday and Friday 30 g 12    CORLANOR  5 MG TABS tablet Take 5 mg by mouth 2 (two) times daily.  5    COSENTYX SENSOREADY PEN 150 MG/ML SOAJ Inject into the skin every 28 (twenty-eight) days.      cyanocobalamin  (VITAMIN B12) 1000 MCG tablet Take 1,000 mcg by mouth daily.      diazepam (VALIUM) 5 MG tablet Insert one tablet (5mg ) into vagina as needed for pelvic pain every 12 hours.      escitalopram  (LEXAPRO ) 5 MG tablet Take 1 tablet by mouth daily.      fludrocortisone  (FLORINEF ) 0.1 MG tablet Take 100 mcg by mouth daily.  3    folic acid  (FOLVITE ) 1 MG tablet Take 1 tablet by mouth daily.      gabapentin  (NEURONTIN ) 400 MG capsule Take 1,200 mg by mouth at bedtime.      indomethacin  (INDOCIN ) 50 MG capsule Take 50 mg by mouth 2 (two) times daily with a meal.      methenamine  (HIPREX ) 1 g tablet Take 1 g by mouth.      methotrexate  (RHEUMATREX) 2.5 MG tablet Take 15 mg by mouth.      metoprolol  succinate (TOPROL -XL) 25 MG 24 hr tablet Take 12.5 mg by mouth daily.      nortriptyline  (PAMELOR ) 10 MG capsule Take 10 mg by mouth.      topiramate  (TOPAMAX ) 50 MG tablet Take 50 mg by mouth 3 (three) times daily.       Patient Stressors:    Patient Strengths:    Treatment Modalities: Medication Management, Group therapy, Case management,  1 to 1 session with clinician, Psychoeducation, Recreational therapy.   Physician Treatment Plan for Primary Diagnosis: Bipolar 2 disorder (HCC) Long Term Goal(s): Improvement in symptoms so as ready for discharge   Short Term Goals: Ability to maintain clinical measurements within normal limits will improve Compliance with prescribed medications will improve Ability to identify triggers associated with substance abuse/mental health issues will improve  Medication Management: Evaluate patient's response, side effects, and tolerance of medication regimen.  Therapeutic Interventions: 1 to 1 sessions, Unit Group sessions  and Medication administration.  Evaluation of Outcomes: Progressing  Physician Treatment Plan for Secondary Diagnosis: Principal Problem:   Bipolar 2 disorder (HCC)  Long Term Goal(s): Improvement in symptoms so as ready for discharge   Short Term Goals: Ability to maintain clinical measurements within normal limits will  improve Compliance with prescribed medications will improve Ability to identify triggers associated with substance abuse/mental health issues will improve     Medication Management: Evaluate patient's response, side effects, and tolerance of medication regimen.  Therapeutic Interventions: 1 to 1 sessions, Unit Group sessions and Medication administration.  Evaluation of Outcomes: Progressing   RN Treatment Plan for Primary Diagnosis: Bipolar 2 disorder (HCC) Long Term Goal(s): Knowledge of disease and therapeutic regimen to maintain health will improve  Short Term Goals: Ability to verbalize frustration and anger appropriately will improve, Ability to demonstrate self-control, Ability to participate in decision making will improve, Ability to verbalize feelings will improve, Ability to disclose and discuss suicidal ideas, and Ability to identify and develop effective coping behaviors will improve   Medication Management: RN will administer medications as ordered by provider, will assess and evaluate patient's response and provide education to patient for prescribed medication. RN will report any adverse and/or side effects to prescribing provider.  Therapeutic Interventions: 1 on 1 counseling sessions, Psychoeducation, Medication administration, Evaluate responses to treatment, Monitor vital signs and CBGs as ordered, Perform/monitor CIWA, COWS, AIMS and Fall Risk screenings as ordered, Perform wound care treatments as ordered.  Evaluation of Outcomes: Progressing   LCSW Treatment Plan for Primary Diagnosis: Bipolar 2 disorder (HCC) Long Term Goal(s): Safe transition  to appropriate next level of care at discharge, Engage patient in therapeutic group addressing interpersonal concerns.  Short Term Goals: Engage patient in aftercare planning with referrals and resources, Increase social support, Increase ability to appropriately verbalize feelings, Increase emotional regulation, Facilitate acceptance of mental health diagnosis and concerns, Facilitate patient progression through stages of change regarding substance use diagnoses and concerns, Identify triggers associated with mental health/substance abuse issues, and Increase skills for wellness and recovery   Therapeutic Interventions: Assess for all discharge needs, 1 to 1 time with Social worker, Explore available resources and support systems, Assess for adequacy in community support network, Educate family and significant other(s) on suicide prevention, Complete Psychosocial Assessment, Interpersonal group therapy.  Evaluation of Outcomes: Progressing   Progress in Treatment: Attending groups: Yes. 01/29/24 Update: Yes.   Participating in groups: Yes. 01/29/24 Update: Yes.   Taking medication as prescribed: Yes. 01/29/24 Update: Yes.   Toleration medication: Yes. 01/29/24 Update: Yes.   Family/Significant other contact made: No, will contact:  CSW to contact once permission is granted. 01/29/24 Update: Yes, Education Completed;  Rosaline FURY [phone], Friend, has been identified by the patient as the family member/significant other with whom the patient will be residing, and identified as the person(s) who will aid the patient in the event of a mental health crisis (suicidal ideations/suicide attempt).  With written consent from the patient, the family member/significant other has been provided the following suicide prevention education, prior to the and/or following the discharge of the patient.  Patient understands diagnosis: Yes. and No. 01/29/24 Update: Yes. Discussing patient identified problems/goals  with staff: Yes. and No. 01/29/24 Update: Yes. Medical problems stabilized or resolved: Yes. 01/29/24 Update: Yes. Denies suicidal/homicidal ideation: Yes. 01/29/24 Update: Yes.  Issues/concerns per patient self-inventory: No. 01/29/24 Update: No.  Other: None 01/29/24 Update: None.   New problem(s) identified: No, Describe:  None 01/29/24 Update: None.   New Short Term/Long Term Goal(s):detox, elimination of symptoms of psychosis, medication management for mood stabilization; elimination of SI thoughts; development of comprehensive mental wellness/sobriety plan. 01/29/24 Update: Goal to remain the same.    Patient Goals:  I don't know. 01/29/24 Update: Goal to remain the same.  Discharge Plan or Barriers: CSW to assist with the development of appropriate discharge plan. 01/29/24 Update: Goal to remain the same.   Reason for Continuation of Hospitalization: Anxiety Delusions  Depression Mania Suicidal ideation  Estimated Length of Stay: 1-7 days. 01/29/24 Update: 02/01/24 Pending or TBD.   Last 3 [place] Suicide Severity Risk Score: Flowsheet Row Admission (Current) from 01/23/20[facility] in [facility] ED from 01/22/2024 in [facility] Emergency Department at [facility] UC from 10/09/2023 in [facility] at [facility] RISK CATEGORY High Risk High Risk No Risk    Last PHQ 2/9 Scores:     No data to display          Scribe for Treatment Team: Susy Placzek M Anea Fodera, LCSW 01/29/2024 1:22 PM

## 2024-01-29 NOTE — Group Note (Signed)
 Date:  01/29/2024 Time:  10:04 PM  Group Topic/Focus:  Goals Group:   The focus of this group is to help patients establish daily goals to achieve during treatment and discuss how the patient can incorporate goal setting into their daily lives to aide in recovery.    Participation Level:  Active  Participation Quality:  Appropriate  Affect:  Appropriate  Cognitive:  Alert  Insight: Appropriate  Engagement in Group:  Engaged  Modes of Intervention:  Discussion  Additional Comments:     Lacinda JINNY Simpers 01/29/2024, 10:04 PM

## 2024-01-29 NOTE — Group Note (Signed)
 LCSW Group Therapy Note  Group Date: 01/29/2024 Start Time: 1330 End Time: 1430   Type of Therapy and Topic:  Group Therapy - How To Cope with Nervousness about Discharge   Participation Level:  Active   Description of Group This process group involved identification of patients' feelings about discharge. Some of them are scheduled to be discharged soon, while others are new admissions, but each of them was asked to share thoughts and feelings surrounding discharge from the hospital. One common theme was that they are excited at the prospect of going home, while another was that many of them are apprehensive about sharing why they were hospitalized. Patients were given the opportunity to discuss these feelings with their peers in preparation for discharge.  Therapeutic Goals  Patient will identify their overall feelings about pending discharge. Patient will think about how they might proactively address issues that they believe will once again arise once they get home (i.e. with parents). Patients will participate in discussion about having hope for change.   Summary of Patient Progress:   Patient was very active throughout the session. Patient demonstrated fair insight into the subject matter, and proved open to input from peers and feedback from CSW. Patient was respectful of peers and participated throughout the entire session.  Patient asked appropriate questions regarding discharge and discharge planning.    Therapeutic Modalities Cognitive Behavioral Therapy   Sherryle JINNY Margo, LCSW 01/29/2024  2:45 PM

## 2024-01-29 NOTE — Progress Notes (Signed)
   01/29/24 2200  Psychosocial Assessment  Patient Complaints Anxiety  Eye Contact Fair  Facial Expression Anxious  Affect Appropriate to circumstance  Speech Logical/coherent  Interaction Assertive  Motor Activity Slow  Appearance/Hygiene Unremarkable  Behavior Characteristics Appropriate to situation  Mood Pleasant  Thought Process  Coherency WDL  Content WDL  Delusions None reported or observed  Perception WDL  Hallucination None reported or observed  Judgment WDL  Confusion WDL  Danger to Self  Current suicidal ideation? Denies  Danger to Others  Danger to Others None reported or observed

## 2024-01-29 NOTE — Group Note (Unsigned)
 Date:  01/30/2024 Time:  5:47 PM  Group Topic/Focus:  Coping With Mental Health Crisis:   The purpose of this group is to help patients identify strategies for coping with mental health crisis.  Group discusses possible causes of crisis and ways to manage them effectively.    Participation Level:  Active  Participation Quality:  Appropriate  Affect:  Appropriate  Cognitive:  Appropriate  Insight: Appropriate  Engagement in Group:  Engaged  Modes of Intervention:  Discussion  Additional Comments:    Lisa Clay Hover 01/30/2024, 5:47 PM

## 2024-01-29 NOTE — Group Note (Signed)
 Date:  01/29/2024 Time:  5:28 PM  Group Topic/Focus:  Wellness Toolbox:   The focus of this group is to discuss various aspects of wellness, balancing those aspects and exploring ways to increase the ability to experience wellness.  Patients will create a wellness toolbox for use upon discharge.    Participation Level:  Did Not Attend   Deitra Clap [facility] 01/29/2024, 5:28 PM

## 2024-01-29 NOTE — Plan of Care (Signed)
   Problem: Education: Goal: Knowledge of Lisa Clay General Education information/materials will improve Outcome: Progressing Goal: Emotional status will improve Outcome: Progressing Goal: Mental status will improve Outcome: Progressing Goal: Verbalization of understanding the information provided will improve Outcome: Progressing   Problem: Activity: Goal: Interest or engagement in activities will improve Outcome: Progressing Goal: Sleeping patterns will improve Outcome: Progressing   Problem: Coping: Goal: Ability to verbalize frustrations and anger appropriately will improve Outcome: Progressing Goal: Ability to demonstrate self-control will improve Outcome: Progressing

## 2024-01-29 NOTE — Group Note (Signed)
 Date:  01/29/2024 Time:  3:36 PM  Group Topic/Focus:  Spirituality:   The focus of this group is to discuss how one's spirituality can aide in recovery.    Participation Level:  Active  Participation Quality:  Appropriate  Affect:  Appropriate  Cognitive:  Appropriate  Insight: Appropriate  Engagement in Group:  Engaged  Modes of Intervention:  Discussion  Additional Comments:    Lisa Clay 01/29/2024, 3:36 PM

## 2024-01-29 NOTE — Plan of Care (Signed)
   Problem: Education: Goal: Emotional status will improve Outcome: Progressing Goal: Mental status will improve Outcome: Progressing Goal: Verbalization of understanding the information provided will improve Outcome: Progressing

## 2024-01-30 ENCOUNTER — Encounter: Payer: Self-pay | Admitting: Psychiatry

## 2024-01-30 DIAGNOSIS — N390 Urinary tract infection, site not specified: Secondary | ICD-10-CM | POA: Insufficient documentation

## 2024-01-30 MED ORDER — ACYCLOVIR 200 MG PO CAPS
400.0000 mg | ORAL_CAPSULE | Freq: Three times a day (TID) | ORAL | Status: DC
  Administered 2024-01-30 – 2024-01-31 (×3): 400 mg via ORAL
  Filled 2024-01-30 (×4): qty 2

## 2024-01-30 NOTE — Group Note (Signed)
 Recreation Therapy Group Note   Group Topic:Health and Wellness  Group Date: 01/30/2024 Start Time: 1000 End Time: 1100 Facilitators: Celestia Jeoffrey BRAVO, LRT, CTRS Location: Courtyard  Group Description: [employer]. LRT and patients played games of basketball, drew with chalk, and played corn hole while outside in the courtyard while getting fresh air and sunlight. Music was being played in the background. LRT and peers conversed about different games they have played before, what they do in their free time and anything else that is on their minds. LRT encouraged pts to drink water after being outside, sweating and getting their heart rate up.  Goal Area(s) Addressed: Patient will build on frustration tolerance skills. Patients will partake in a competitive play game with peers. Patients will gain knowledge of new leisure interest/hobby.   Affect/Mood: Appropriate   Participation Level: Active and Engaged   Participation Quality: Independent   Behavior: Appropriate, Calm, and Cooperative   Speech/Thought Process: Coherent   Insight: Good   Judgement: Good   Modes of Intervention: Music, Open Conversation, Rapport Building, and Socialization   Patient Response to Interventions:  Attentive, Engaged, Interested , and Receptive   Education Outcome:  Acknowledges education   Clinical Observations/Individualized Feedback: Lisa Clay was active in their participation of session activities and group discussion. Pt interacted well with LRT and peers duration of session.    Plan: Continue to engage patient in RT group sessions 2-3x/week.   [address] LRT, CTRS 01/30/2024 1:41 PM

## 2024-01-30 NOTE — Assessment & Plan Note (Signed)
 Urine culture positive for E. coli sensitive to TMP-SMX and Keflex  Patient received 2 days of Keflex  Currently on TMP-SMX, to complete 3 days Per [facility] provider Hoy Pinal, patient no longer experiencing urinary symptoms and her mentation has improved Continue TMP-SMX to complete 3 days of antibiotics with follow-up to PCP/gynecology as appropriate

## 2024-01-30 NOTE — Progress Notes (Signed)
 Pharmacy Antibiotic Note  Pharmacy has been consulted for Acyclovir  dosing for oral HSV for Lisa Clay  a 53 y.o. female admitted on 01/23/2024.   Plan: Acyclovir  400 mg 3 times daily for 5 to 10 days  Height: 5' 6 (167.6 cm) Weight: 56.9 kg (125 lb 8 oz) IBW/kg (Calculated) : 59.3  Temp (24hrs), Avg:98.8 F (37.1 C), Min:98.8 F (37.1 C), Max:98.8 F (37.1 C)  Recent Labs  Lab 01/27/24 0709  CREATININE 0.84    Estimated Creatinine Clearance: 69.6 mL/min (by C-G formula based on SCr of 0.84 mg/dL).    Allergies  Allergen Reactions   Erythromycin Nausea And Vomiting    Other Reaction(s): GI Intolerance, Other (See Comments)   Corticosteroids Hives and Rash    Antimicrobials this admission: 8/21 Acyclovir   >>    Thank you for allowing pharmacy to be a part of this patient's care.  Estill CHRISTELLA Lutes, PharmD, BCPS Clinical Pharmacist 01/30/2024 5:18 PM

## 2024-01-30 NOTE — Progress Notes (Signed)
   01/30/24 2200  Psych Admission Type (Psych Patients Only)  Admission Status Involuntary  Psychosocial Assessment  Patient Complaints Other (Comment) (wanted the Acyclovir  on a different schedule)  Eye Contact Fair  Facial Expression Anxious  Affect Anxious  Speech Logical/coherent  Interaction Assertive  Motor Activity Slow  Appearance/Hygiene Unremarkable  Behavior Characteristics Cooperative;Appropriate to situation  Mood Pleasant  Aggressive Behavior  Effect No apparent injury  Thought Process  Coherency WDL  Content WDL  Delusions None reported or observed  Perception WDL  Hallucination None reported or observed  Judgment WDL  Confusion None  Danger to Self  Current suicidal ideation? Denies  Agreement Not to Harm Self Yes  Description of Agreement Verbalized  Danger to Others  Danger to Others None reported or observed   Patient is excited about her d/c on tomorrow.  She was remained to check to ensure she has all her prescriptions before leaving the hospital.  She was concerned and wanted her dose of Acyclovir  on a different schedule.

## 2024-01-30 NOTE — Progress Notes (Signed)
 [facility] MD Progress Note  01/30/2024 1:06 PM Lisa Clay  MRN:  [id number]   Subjective:  Chart reviewed, case discussed in multidisciplinary meeting, patient seen during rounds.    Patient seen today for follow-up psychiatric evaluation. Reports feeling much better, has talked with son and working on relationship. Positive conversation, reports slept well, appetite stable, denies depression and anxiety, able to engage in linear discussion today with no disorganization or thought blocking, Seroquel  appears to be greatly beneficial. Denies depression and anxiety. She denies auditory or visual hallucinations, paranoia, or delusions. She denies suicidal or homicidal ideation at this time. She denies medication side effects.   8/19: Chart reviewed. Patient seen today for follow-up psychiatric evaluation. Day characterized by significant physical pain related to urinary tract infection. Cultures reflect need to change antibiotic to better target the identified strain. This was reviewed with pharmacy and the attending psychiatrist; Bactrim , DS was ordered, and Pyridium  was added PRN for pain relief.  At bedside, patient reports, "I slept" and states sleep was much better last night with Seroquel . She denies suicidal ideation, homicidal ideation, hallucinations, paranoia, or delusions. Appetite remains stable. She reports history of three rounds of antibiotics prescribed by her urogynecologist to treat UTI: initially Keflex , followed by another course of Keflex , then a 10-day course of Cipro , all of which she reports completing. She identifies that her mother has had good mental health response to Seroquel  and Paxil which is reassuring. She denies medication side effects.   8/18: Patient seen today for follow-up psychiatric evaluation. Following introductions and education related to the purpose of this interview, she reviewed symptoms leading to this admission. She reports not remembering much  but admits to taking "too much medication" and posting "peace out" on Facebook. She describes taking eight Ativan  and eight of another medication. Patient identifies the primary trigger as discord with her children, noting an argument with her 53 year old son that escalated and describing her 53 year old daughter as "toxic."  She denies any prior psychiatric treatment. Patient reports a history of suicidal ideation as a ninth grader due to uninvolved parents but denies past self-harm activity. Current stressors include her mother's dementia diagnosis. She endorses feeling "brain fog," with disorganization and thought blocking observed during the interview. She reports poor sleep, typically staying up until 4 a.m. and waking before breakfast, leaving her tired during the day. She denies auditory or visual hallucinations, paranoia, or delusions. She denies suicidal or homicidal ideation at this time. She denies medication side effects.She reports mood anxious and worried.   Sleep: poor  Appetite:  Fair  Past Psychiatric History: see h&P Family History:  Family History  Problem Relation Age of Onset   Heart attack Maternal Grandmother 50       deceased   Hypertension Father        and high cholesterol    Hypertension Mother        and high cholesterol    Breast cancer Neg Hx    Social History:  Social History   Substance and Sexual Activity  Alcohol Use No   Comment: rare     Social History   Substance and Sexual Activity  Drug Use No    Social History   Socioeconomic History   Marital status: Divorced    Spouse name: Not on file   Number of children: Not on file   Years of education: Not on file   Highest education level: Not on file  Occupational History   Not on file  Tobacco Use   Smoking status: Never   Smokeless tobacco: Never   Tobacco comments:    tobacco use - no   Vaping Use   Vaping status: Never Used  Substance and Sexual Activity   Alcohol use: No     Comment: rare   Drug use: No   Sexual activity: Not on file  Other Topics Concern   Not on file  Social History Narrative   Single; full time; does not get regular exercise.       Patient is disabled   Social Drivers of Corporate investment banker Strain: Patient Declined (11/14/2022)   Received from [facility]   Overall Financial Resource Strain (CARDIA)    Difficulty of Paying Living Expenses: Patient declined  Food Insecurity: Patient Declined (01/23/2024)   Hunger Vital Sign    Worried About Running Out of Food in the Last Year: Patient declined    Ran Out of Food in the Last Year: Patient declined  Transportation Needs: Patient Declined (01/23/2024)   PRAPARE - Administrator, Civil Service (Medical): Patient declined    Lack of Transportation (Non-Medical): Patient declined  Physical Activity: Not on file  Stress: Not on file  Social Connections: Socially Isolated (01/23/2024)   Social Connection and Isolation Panel    Frequency of Communication with Friends and Family: Three times a week    Frequency of Social Gatherings with Friends and Family: Three times a week    Attends Religious Services: Never    Active Member of Clubs or Organizations: No    Attends Banker Meetings: Never    Marital Status: Separated   Past Medical History:  Past Medical History:  Diagnosis Date   Atrial tachycardia (HCC)    Dysrhythmia    Hx: PSVT. Current: Postural Orthostatic Tachycardia Syndrome, Inappropriate Sinus Node Tachycardia   Headache    migraines, rare.   Kidney stones    Palpitations    [facility] 2/12   Shortness of breath dyspnea    on exertion, secondary to heart issues   Sinus tachycardia    [facility] 2/12   Syncope    secondary to heart issues    Past Surgical History:  Procedure Laterality Date   BREAST BIOPSY Left 05/11/2013   stereo biopsy   CARDIAC ELECTROPHYSIOLOGY STUDY AND ABLATION  04/24/11   [facility]   CLOSED  MANIPULATION SHOULDER WITH STERIOD INJECTION Right 02/16/2015   Procedure: CLOSED MANIPULATION SHOULDER WITH STEROID INJECTION;  Surgeon: Norleen JINNY Maltos, MD;  Location: [facility];  Service: Orthopedics;  Laterality: Right;   FINGER SURGERY Right 12/25/2017   explorative surgery on R5th PIP    TONSILLECTOMY      Current Medications: Current Facility-Administered Medications  Medication Dose Route Frequency Provider Last Rate Last Admin   acetaminophen  (TYLENOL ) tablet 650 mg  650 mg Oral Q6H PRN Jadapalle, Sree, MD   650 mg at 01/26/24 2109   alum & mag hydroxide-simeth (MAALOX/MYLANTA) 200-200-20 MG/5ML suspension 30 mL  30 mL Oral Q4H PRN Jadapalle, Sree, MD       atorvastatin  (LIPITOR) tablet 40 mg  40 mg Oral QHS Jadapalle, Sree, MD   40 mg at 01/29/24 2112   conjugated estrogens  (PREMARIN ) vaginal cream 1 Applicatorful  1 Applicatorful Vaginal Q M,W,F Jadapalle, Sree, MD   1 Applicatorful at 01/24/24 2100   cyanocobalamin  (VITAMIN B12) tablet 1,000 mcg  1,000 mcg Oral Daily Jadapalle, Sree, MD   1,000 mcg at 01/30/24 0910  diphenhydrAMINE  (BENADRYL ) capsule 25-50 mg  25-50 mg Oral BID PRN Millington, Matthew E, PA-C   25 mg at 01/27/24 1236   haloperidol  (HALDOL ) tablet 5 mg  5 mg Oral TID PRN Jadapalle, Sree, MD       And   diphenhydrAMINE  (BENADRYL ) capsule 50 mg  50 mg Oral TID PRN Jadapalle, Sree, MD   50 mg at 01/25/24 2200   haloperidol  lactate (HALDOL ) injection 5 mg  5 mg Intramuscular TID PRN Jadapalle, Sree, MD       And   diphenhydrAMINE  (BENADRYL ) injection 50 mg  50 mg Intramuscular TID PRN Jadapalle, Sree, MD       And   LORazepam  (ATIVAN ) injection 2 mg  2 mg Intramuscular TID PRN Jadapalle, Sree, MD       haloperidol  lactate (HALDOL ) injection 10 mg  10 mg Intramuscular TID PRN Jadapalle, Sree, MD       And   diphenhydrAMINE  (BENADRYL ) injection 50 mg  50 mg Intramuscular TID PRN Jadapalle, Sree, MD       And   LORazepam  (ATIVAN ) injection 2 mg  2 mg  Intramuscular TID PRN Jadapalle, Sree, MD       escitalopram  (LEXAPRO ) tablet 10 mg  10 mg Oral Daily Millington, Matthew E, PA-C   10 mg at 01/30/24 9089   fludrocortisone  (FLORINEF ) tablet 100 mcg  100 mcg Oral Daily Jadapalle, Sree, MD   100 mcg at 01/30/24 0910   folic acid  (FOLVITE ) tablet 1 mg  1 mg Oral Daily Jadapalle, Sree, MD   1 mg at 01/30/24 9089   gabapentin  (NEURONTIN ) capsule 1,200 mg  1,200 mg Oral QHS Jadapalle, Sree, MD   1,200 mg at 01/29/24 2113   indomethacin  (INDOCIN ) capsule 50 mg  50 mg Oral BID WC Jadapalle, Sree, MD   50 mg at 01/30/24 9089   ivabradine  (CORLANOR ) tablet 5 mg  5 mg Oral BID Jadapalle, Sree, MD   5 mg at 01/30/24 9089   magnesium  hydroxide (MILK OF MAGNESIA) suspension 30 mL  30 mL Oral Daily PRN Donnelly Mellow, MD       methenamine  (MANDELAMINE) tablet 1,000 mg  1,000 mg Oral BID Millington, Matthew E, PA-C   1,000 mg at 01/27/24 0835   methotrexate  (RHEUMATREX) tablet 15 mg  15 mg Oral Q Fri Jadapalle, Sree, MD   15 mg at 01/24/24 0830   metoprolol  succinate (TOPROL -XL) 24 hr tablet 12.5 mg  12.5 mg Oral Daily Jadapalle, Sree, MD   12.5 mg at 01/30/24 9090   nortriptyline  (PAMELOR ) capsule 10 mg  10 mg Oral QHS Jadapalle, Sree, MD   10 mg at 01/29/24 2113   phenazopyridine  (PYRIDIUM ) tablet 100 mg  100 mg Oral TID PRN Cleotilde Hoy HERO, NP   100 mg at 01/30/24 9089   QUEtiapine  (SEROQUEL ) tablet 100 mg  100 mg Oral QHS Cleotilde Hoy HERO, NP   100 mg at 01/29/24 2112   sulfamethoxazole -trimethoprim  (BACTRIM  DS) 800-160 MG per tablet 1 tablet  1 tablet Oral Q12H Jadapalle, Sree, MD   1 tablet at 01/30/24 9090   topiramate  (TOPAMAX ) tablet 50 mg  50 mg Oral TID Jadapalle, Sree, MD   50 mg at 01/30/24 1222    Lab Results:  No results found for this or any previous visit (from the past 48 hours).    Blood Alcohol level:  Lab Results  Component Value Date   [facility] <15 01/22/2024    Metabolic Disorder Labs: No results found for: HGBA1C, MPG No  results found for: PROLACTIN No results found for: CHOL, TRIG, HDL, CHOLHDL, VLDL, LDLCALC   Psychiatric Specialty Exam: Appearance: Clean, casually dressed for the day Eye Contact: Good Behavior: Calm Speech: Normal rate, rhythm, tone, and volume Mood: appropriately bright Affect: Congruent Thought Process: linear Thought Content: No hallucinations, paranoia, or delusions; denies SI/HI Attention: good Memory: good Concentration: good Recall: good Fund of Knowledge: Good Language: Good Psychomotor Activity: No agitation or retardation observed Orientation: Fully oriented 3 (person, place, time) Insight: fair Judgment: good  Musculoskeletal: Strength & Muscle Tone: within normal limits Gait & Station: normal Assets  Assets: Housing    Physical Exam: Physical Exam Vitals and nursing note reviewed.  HENT:     Head: Atraumatic.  Eyes:     Extraocular Movements: Extraocular movements intact.  Pulmonary:     Effort: Pulmonary effort is normal.  Neurological:     Mental Status: She is alert and oriented to person, place, and time.    Review of Systems  Genitourinary:  Positive for dysuria.  Psychiatric/Behavioral:  Positive for depression. Negative for hallucinations, substance abuse and suicidal ideas. The patient is nervous/anxious. The patient does not have insomnia.    Blood pressure 112/65, pulse 72, temperature 98.8 F (37.1 C), resp. rate 18, height 5' 6 (1.676 m), weight 56.9 kg, SpO2 96%. Body mass index is 20.26 kg/m.  Diagnosis: Principal Problem:   Bipolar 2 disorder (HCC)   PLAN: Safety and Monitoring:  -- Voluntary admission to inpatient psychiatric unit for safety, stabilization and treatment  -- Daily contact with patient to assess and evaluate symptoms and progress in treatment  -- Patient's case to be discussed in multi-disciplinary team meeting  -- Observation Level : q15 minute checks  -- Vital signs:  q12 hours  --  Precautions: suicide, elopement, and assault -- Encouraged patient to participate in unit milieu and in scheduled group therapies  2. Psychiatric Diagnoses and Treatment:   Patient is progressing, she did identify the ativan  overdose as a suicide attempt. Pt requires continues psychiatric hospitalization for medication management and stabilization.   Continued improvement noted today, no change in plan of care. Hospitalist consult pending related to complicated UTI history.   Patient presents with some improved psychiatric stability today, likely supported by improved rest. She reports better sleep with Seroquel  and appears more linear and appropriate during evaluation. Abilify  was discontinued due to concern it may not have been beneficial for her clinical course. Cultures confirmed resistant urinary tract infection, requiring change of antibiotics; Factor DS was initiated, and Pyridium  was added PRN for pain. Patient is reassured by parallels between her mother's mental health stability on Seroquel  and Paxil, and she is tolerating these medications well. No evidence of disorganization observed today. Patient continues to require this acute inpatient psychiatric setting for medical and psychiatric stabilization.       Unspecified mood disorder:         - Continue Lexapro  to 10 mg daily, discussed transition to Cymbalta for chronic pain - Continue nortriptyline  for sleep 10 mg at bedtime - Continue gabapentin  1200 mg nightly which patient reports for sleep and anxiety -Initiate Seroquel  100 mg PO QHS tonight to address disorganization, distress, and poor sleep; evaluate response and consider discontinuing Abilify  if effective   -- The risks/benefits/side-effects/alternatives to this medication were discussed in detail with the patient and time was given for questions. The patient consents to medication trial.                --  Metabolic profile and EKG monitoring obtained while on an atypical  antipsychotic (BMI: Lipid Panel: HbgA1c: QTc:)              -- Encouraged patient to participate in unit milieu and in scheduled group therapies                            3. Medical Issues Being Addressed:   home meds reordered as such Atorvastatin  40 mg nightly Premarin  1 application Monday Wednesday Friday Vitamin B12 1000 mcg daily Florinef  100 mcg daily Indomethacin  50 mg twice daily Corlanor  5 mg twice daily Methenamine  1000 mg 4 times daily Metoprolol  12.5 mg daily Methotrexate  15 mg every Friday Topamax  50 mg 3 times daily  Patient was complaining of pain which she believes is a UTI urine was sent for culture.  Spoke with on-call OB GYN Dr. Beverli Schermerhorn sepsis management beneficial for interstitial cystitis includes hydroxyzine  amitriptyline and NSAIDs.  Patient is on hydroxyzine  will try scheduled dose for several days states currently taking nortriptyline  and they are on indomethacin .  If culture is positive we will treat for UTI. Culture pending pt prefers benadryl  to hydroxyzine .   4. Discharge Planning:   -- Social work and case management to assist with discharge planning and identification of hospital follow-up needs prior to discharge  -- Estimated LOS: 5-6  Hoy CHRISTELLA Pinal, NP 01/30/2024, 1:06 PM

## 2024-01-30 NOTE — Plan of Care (Signed)
  Problem: Education: Goal: Knowledge of [facility] Education information/materials will improve Outcome: Progressing Goal: Emotional status will improve Outcome: Progressing Goal: Mental status will improve Outcome: Progressing Goal: Verbalization of understanding the information provided will improve Outcome: Progressing   Problem: Activity: Goal: Interest or engagement in activities will improve Outcome: Progressing Goal: Sleeping patterns will improve Outcome: Progressing   Problem: Coping: Goal: Ability to verbalize frustrations and anger appropriately will improve Outcome: Progressing Goal: Ability to demonstrate self-control will improve Outcome: Progressing   Problem: Health Behavior/Discharge Planning: Goal: Identification of resources available to assist in meeting health care needs will improve Outcome: Progressing Goal: Compliance with treatment plan for underlying cause of condition will improve Outcome: Progressing   Problem: Physical Regulation: Goal: Ability to maintain clinical measurements within normal limits will improve Outcome: Progressing   Problem: Safety: Goal: Periods of time without injury will increase Outcome: Progressing   Problem: Education: Goal: Ability to state activities that reduce stress will improve Outcome: Progressing   Problem: Coping: Goal: Ability to identify and develop effective coping behavior will improve Outcome: Progressing   Problem: Self-Concept: Goal: Ability to identify factors that promote anxiety will improve Outcome: Progressing Goal: Level of anxiety will decrease Outcome: Progressing Goal: Ability to modify response to factors that promote anxiety will improve Outcome: Progressing   Problem: Education: Goal: Utilization of techniques to improve thought processes will improve Outcome: Progressing Goal: Knowledge of the prescribed therapeutic regimen will improve Outcome: Progressing   Problem:  Activity: Goal: Interest or engagement in leisure activities will improve Outcome: Progressing Goal: Imbalance in normal sleep/wake cycle will improve Outcome: Progressing   Problem: Coping: Goal: Coping ability will improve Outcome: Progressing Goal: Will verbalize feelings Outcome: Progressing   Problem: Health Behavior/Discharge Planning: Goal: Ability to make decisions will improve Outcome: Progressing Goal: Compliance with therapeutic regimen will improve Outcome: Progressing   Problem: Role Relationship: Goal: Will demonstrate positive changes in social behaviors and relationships Outcome: Progressing   Problem: Safety: Goal: Ability to disclose and discuss suicidal ideas will improve Outcome: Progressing Goal: Ability to identify and utilize support systems that promote safety will improve Outcome: Progressing   Problem: Self-Concept: Goal: Will verbalize positive feelings about self Outcome: Progressing Goal: Level of anxiety will decrease Outcome: Progressing   Problem: Education: Goal: Ability to make informed decisions regarding treatment will improve Outcome: Progressing   Problem: Coping: Goal: Coping ability will improve Outcome: Progressing   Problem: Health Behavior/Discharge Planning: Goal: Identification of resources available to assist in meeting health care needs will improve Outcome: Progressing   Problem: Medication: Goal: Compliance with prescribed medication regimen will improve Outcome: Progressing   Problem: Self-Concept: Goal: Ability to disclose and discuss suicidal ideas will improve Outcome: Progressing Goal: Will verbalize positive feelings about self Outcome: Progressing   

## 2024-01-30 NOTE — Consult Note (Signed)
 Initial Consultation Note   Patient: Lisa Clay FMW:[id number] DOB: 1971-03-12 PCP: Glover Lenis, MD DOA: 01/23/2024 DOS: the patient was seen and examined on 01/30/2024 Primary service: Donnelly Mellow, MD  Referring provider: Hoy Pinal, NP Reason for consult: uti abx appropriate therapy?  Assessment and Plan:  UTI (urinary tract infection) Urine culture positive for E. coli sensitive to TMP-SMX and Keflex  Patient received 2 days of Keflex  Currently on TMP-SMX, to complete 3 days Per [facility] provider Hoy Pinal, patient no longer experiencing urinary symptoms and her mentation has improved Continue TMP-SMX to complete 3 days of antibiotics with follow-up to PCP/gynecology as appropriate  Discussed with Hoy Pinal.  She states she does not require patient to be seen and says she just wanted to make sure that her current medication for UTI and duration of treatment is appropriate  TRH will sign off at present, please call us  again when needed.  HPI: Lisa Clay is a 53 y.o. female with past medical history of suicidal ideation, bipolar, who presents to the ED on 01/22/2024 for chief concerns of suicidal ideation, agitation.  Patient was IVC.  It was reported per EDP that IVC documentation report from law enforcement, patient went to her neighbor's house requesting a firearm to kill herself.  Hospitalist service consulted to ensure appropriate antibiotic therapy for UTI.  Past Medical History:  Diagnosis Date   Atrial tachycardia (HCC)    Dysrhythmia    Hx: PSVT. Current: Postural Orthostatic Tachycardia Syndrome, Inappropriate Sinus Node Tachycardia   Headache    migraines, rare.   Kidney stones    Palpitations    [facility] 2/12   Shortness of breath dyspnea    on exertion, secondary to heart issues   Sinus tachycardia    [facility] 2/12   Syncope    secondary to heart issues   Past Surgical History:  Procedure Laterality Date   BREAST BIOPSY  Left 05/11/2013   stereo biopsy   CARDIAC ELECTROPHYSIOLOGY STUDY AND ABLATION  04/24/11   [facility]   CLOSED MANIPULATION SHOULDER WITH STERIOD INJECTION Right 02/16/2015   Procedure: CLOSED MANIPULATION SHOULDER WITH STEROID INJECTION;  Surgeon: Norleen JINNY Maltos, MD;  Location: [facility];  Service: Orthopedics;  Laterality: Right;   FINGER SURGERY Right 12/25/2017   explorative surgery on R5th PIP    TONSILLECTOMY     Social History:  reports that she has never smoked. She has never used smokeless tobacco. She reports that she does not drink alcohol and does not use drugs.  Allergies  Allergen Reactions   Erythromycin Nausea And Vomiting    Other Reaction(s): GI Intolerance, Other (See Comments)   Corticosteroids Hives and Rash   Family History  Problem Relation Age of Onset   Heart attack Maternal Grandmother 50       deceased   Hypertension Father        and high cholesterol    Hypertension Mother        and high cholesterol    Breast cancer Neg Hx    Prior to Admission medications   Medication Sig Start Date End Date Taking? Authorizing Provider  atorvastatin  (LIPITOR) 40 MG tablet Take 1 tablet by mouth daily. 10/02/21   [provider]  conjugated estrogens  (PREMARIN ) vaginal cream Discard applicator and Apply pea sized amount to tip of finger to urethra before bed. Wash hands well after application. Use Monday, Wednesday and Friday 01/23/23   Francisca Redell BROCKS, MD  CORLANOR  5 MG TABS tablet Take  5 mg by mouth 2 (two) times daily. 01/07/15   [provider]  COSENTYX SENSOREADY PEN 150 MG/ML SOAJ Inject into the skin every 28 (twenty-eight) days.    [provider]  cyanocobalamin  (VITAMIN B12) 1000 MCG tablet Take 1,000 mcg by mouth daily.    [provider]  diazepam (VALIUM) 5 MG tablet Insert one tablet (5mg ) into vagina as needed for pelvic pain every 12 hours. 09/23/23   [provider]  escitalopram  (LEXAPRO ) 5 MG  tablet Take 1 tablet by mouth daily. 10/04/21   [provider]  fludrocortisone  (FLORINEF ) 0.1 MG tablet Take 100 mcg by mouth daily. 01/28/15   [provider]  folic acid  (FOLVITE ) 1 MG tablet Take 1 tablet by mouth daily. 02/03/19   [provider]  gabapentin  (NEURONTIN ) 400 MG capsule Take 1,200 mg by mouth at bedtime. 02/17/19   [provider]  indomethacin  (INDOCIN ) 50 MG capsule Take 50 mg by mouth 2 (two) times daily with a meal.    [provider]  methenamine  (HIPREX ) 1 g tablet Take 1 g by mouth. 05/24/23 05/18/24  [provider]  methotrexate  (RHEUMATREX) 2.5 MG tablet Take 15 mg by mouth. 08/27/23   [provider]  metoprolol  succinate (TOPROL -XL) 25 MG 24 hr tablet Take 12.5 mg by mouth daily. 12/02/20   [provider]  nortriptyline  (PAMELOR ) 10 MG capsule Take 10 mg by mouth. 10/01/23 12/30/23  [provider]  topiramate  (TOPAMAX ) 50 MG tablet Take 50 mg by mouth 3 (three) times daily. 11/14/22   [provider]  levonorgestrel (MIRENA) 20 MCG/24HR IUD 1 each by Intrauterine route once.    01/30/20  [provider]  propranolol (INDERAL) 20 MG tablet TAKE 1 TABLET (20 MG TOTAL) BY MOUTH 4 TIMES DAILY. 12/03/14 01/30/20  [provider]   Physical Exam: Vitals:   01/28/24 1645 01/29/24 0643 01/29/24 1703 01/30/24 0644  BP: 119/71 (!) 100/59 116/71 112/65  Pulse: 73 71 73 72  Resp: 18 16 16 18   Temp: (!) 97.5 F (36.4 C) 97.6 F (36.4 C) 97.6 F (36.4 C) 98.8 F (37.1 C)  TempSrc:      SpO2: 99% 98% 100% 96%  Weight:      Height:       Primary team communication: yes Thank you very much for involving us  in the care of your patient.  Author: Dr. Sherre 01/30/2024 2:43 PM  For on call review [email or web address].

## 2024-01-30 NOTE — Group Note (Signed)
 [facility] LCSW Group Therapy Note   Group Date: 01/30/2024 Start Time: 1300 End Time: 1400   Type of Therapy/Topic:  Group Therapy:  Balance in Life  Participation Level:  Active   Description of Group:    This group will address the concept of balance and how it feels and looks when one is unbalanced. Patients will be encouraged to process areas in their lives that are out of balance, and identify reasons for remaining unbalanced. Facilitators will guide patients utilizing problem- solving interventions to address and correct the stressor making their life unbalanced. Understanding and applying boundaries will be explored and addressed for obtaining  and maintaining a balanced life. Patients will be encouraged to explore ways to assertively make their unbalanced needs known to significant others in their lives, using other group members and facilitator for support and feedback.  Therapeutic Goals: Patient will identify two or more emotions or situations they have that consume much of in their lives. Patient will identify signs/triggers that life has become out of balance:  Patient will identify two ways to set boundaries in order to achieve balance in their lives:  Patient will demonstrate ability to communicate their needs through discussion and/or role plays  Summary of Patient Progress: Patient was present for the entirety of the group process. She was involved in the discussion. Pt's comments were thought provoking and furthered the discussion. She appeared open and receptive to feedback/comments from both her peers and the facilitator.    Therapeutic Modalities:   Cognitive Behavioral Therapy Solution-Focused Therapy Assertiveness Training   Nadara JONELLE Fam, LCSW

## 2024-01-30 NOTE — Group Note (Signed)
 Date:  01/30/2024 Time:  10:09 AM  Group Topic/Focus:  Goals Group:   The focus of this group is to help patients establish daily goals to achieve during treatment and discuss how the patient can incorporate goal setting into their daily lives to aide in recovery.    Participation Level:  Active  Participation Quality:  Appropriate  Affect:  Appropriate  Cognitive:  Appropriate  Insight: Appropriate  Engagement in Group:  Engaged  Modes of Intervention:  Discussion, Education, and Support  Additional Comments:    Deitra Caron Mainland 01/30/2024, 10:09 AM

## 2024-01-30 NOTE — Plan of Care (Signed)
  Problem: Coping: Goal: Ability to demonstrate self-control will improve Outcome: Progressing   Problem: Health Behavior/Discharge Planning: Goal: Compliance with treatment plan for underlying cause of condition will improve Outcome: Progressing   Problem: Safety: Goal: Periods of time without injury will increase Outcome: Progressing   Problem: Coping: Goal: Ability to identify and develop effective coping behavior will improve Outcome: Progressing   Problem: Education: Goal: Knowledge of the prescribed therapeutic regimen will improve Outcome: Progressing   Problem: Medication: Goal: Compliance with prescribed medication regimen will improve Outcome: Progressing   Problem: Self-Concept: Goal: Will verbalize positive feelings about self Outcome: Progressing

## 2024-01-30 NOTE — Progress Notes (Signed)
 [facility] MD Progress Note  01/30/2024 1:14 PM Lisa Clay  MRN:  [id number]   Subjective:  Chart reviewed, case discussed in multidisciplinary meeting, patient seen during rounds.    Continued noted progress, appropriately bright. Pt reports mood is good denies depression and anxiety. She denies auditory or visual hallucinations, paranoia, or delusions. She denies suicidal or homicidal ideation at this time. She denies medication side effects. Has primary support with neighbors who have visited everyday. Lives at home with son at home.   Reviewed case with Dr. Sherre related to complex UTI history, reviewed treatment history as well as labs. Instructed appropriate course of treatment underway with no further recommendations. Patient should follow up with PCP soon after discharge.    08/20: Patient seen today for follow-up psychiatric evaluation. Reports feeling much better, has talked with son and working on relationship. Positive conversation, reports slept well, appetite stable, denies depression and anxiety, able to engage in linear discussion today with no disorganization or thought blocking, Seroquel  appears to be greatly beneficial. Denies depression and anxiety. She denies auditory or visual hallucinations, paranoia, or delusions. She denies suicidal or homicidal ideation at this time. She denies medication side effects.   8/19: Chart reviewed. Patient seen today for follow-up psychiatric evaluation. Day characterized by significant physical pain related to urinary tract infection. Cultures reflect need to change antibiotic to better target the identified strain. This was reviewed with pharmacy and the attending psychiatrist; Bactrim , DS was ordered, and Pyridium  was added PRN for pain relief.  At bedside, patient reports, "I slept" and states sleep was much better last night with Seroquel . She denies suicidal ideation, homicidal ideation, hallucinations, paranoia, or delusions.  Appetite remains stable. She reports history of three rounds of antibiotics prescribed by her urogynecologist to treat UTI: initially Keflex , followed by another course of Keflex , then a 10-day course of Cipro , all of which she reports completing. She identifies that her mother has had good mental health response to Seroquel  and Paxil which is reassuring. She denies medication side effects.   8/18: Patient seen today for follow-up psychiatric evaluation. Following introductions and education related to the purpose of this interview, she reviewed symptoms leading to this admission. She reports not remembering much but admits to taking "too much medication" and posting "peace out" on Facebook. She describes taking eight Ativan  and eight of another medication. Patient identifies the primary trigger as discord with her children, noting an argument with her 10 year old son that escalated and describing her 44 year old daughter as "toxic."  She denies any prior psychiatric treatment. Patient reports a history of suicidal ideation as a ninth grader due to uninvolved parents but denies past self-harm activity. Current stressors include her mother's dementia diagnosis. She endorses feeling "brain fog," with disorganization and thought blocking observed during the interview. She reports poor sleep, typically staying up until 4 a.m. and waking before breakfast, leaving her tired during the day. She denies auditory or visual hallucinations, paranoia, or delusions. She denies suicidal or homicidal ideation at this time. She denies medication side effects.She reports mood anxious and worried.   Sleep: poor  Appetite:  Fair  Past Psychiatric History: see h&P Family History:  Family History  Problem Relation Age of Onset   Heart attack Maternal Grandmother 50       deceased   Hypertension Father        and high cholesterol    Hypertension Mother        and high cholesterol    Breast cancer  Neg Hx    Social  History:  Social History   Substance and Sexual Activity  Alcohol Use No   Comment: rare     Social History   Substance and Sexual Activity  Drug Use No    Social History   Socioeconomic History   Marital status: Divorced    Spouse name: Not on file   Number of children: Not on file   Years of education: Not on file   Highest education level: Not on file  Occupational History   Not on file  Tobacco Use   Smoking status: Never   Smokeless tobacco: Never   Tobacco comments:    tobacco use - no   Vaping Use   Vaping status: Never Used  Substance and Sexual Activity   Alcohol use: No    Comment: rare   Drug use: No   Sexual activity: Not on file  Other Topics Concern   Not on file  Social History Narrative   Single; full time; does not get regular exercise.       Patient is disabled   Social Drivers of Corporate investment banker Strain: Patient Declined (11/14/2022)   Received from [facility]   Overall Financial Resource Strain (CARDIA)    Difficulty of Paying Living Expenses: Patient declined  Food Insecurity: Patient Declined (01/23/2024)   Hunger Vital Sign    Worried About Running Out of Food in the Last Year: Patient declined    Ran Out of Food in the Last Year: Patient declined  Transportation Needs: Patient Declined (01/23/2024)   PRAPARE - Administrator, Civil Service (Medical): Patient declined    Lack of Transportation (Non-Medical): Patient declined  Physical Activity: Not on file  Stress: Not on file  Social Connections: Socially Isolated (01/23/2024)   Social Connection and Isolation Panel    Frequency of Communication with Friends and Family: Three times a week    Frequency of Social Gatherings with Friends and Family: Three times a week    Attends Religious Services: Never    Active Member of Clubs or Organizations: No    Attends Banker Meetings: Never    Marital Status: Separated   Past Medical  History:  Past Medical History:  Diagnosis Date   Atrial tachycardia (HCC)    Dysrhythmia    Hx: PSVT. Current: Postural Orthostatic Tachycardia Syndrome, Inappropriate Sinus Node Tachycardia   Headache    migraines, rare.   Kidney stones    Palpitations    [facility] 2/12   Shortness of breath dyspnea    on exertion, secondary to heart issues   Sinus tachycardia    [facility] 2/12   Syncope    secondary to heart issues    Past Surgical History:  Procedure Laterality Date   BREAST BIOPSY Left 05/11/2013   stereo biopsy   CARDIAC ELECTROPHYSIOLOGY STUDY AND ABLATION  04/24/11   [facility]   CLOSED MANIPULATION SHOULDER WITH STERIOD INJECTION Right 02/16/2015   Procedure: CLOSED MANIPULATION SHOULDER WITH STEROID INJECTION;  Surgeon: Norleen JINNY Maltos, MD;  Location: [facility];  Service: Orthopedics;  Laterality: Right;   FINGER SURGERY Right 12/25/2017   explorative surgery on R5th PIP    TONSILLECTOMY      Current Medications: Current Facility-Administered Medications  Medication Dose Route Frequency Provider Last Rate Last Admin   acetaminophen  (TYLENOL ) tablet 650 mg  650 mg Oral Q6H PRN Jadapalle, Sree, MD   650 mg at 01/26/24 2109  alum & mag hydroxide-simeth (MAALOX/MYLANTA) 200-200-20 MG/5ML suspension 30 mL  30 mL Oral Q4H PRN Jadapalle, Sree, MD       atorvastatin  (LIPITOR) tablet 40 mg  40 mg Oral QHS Jadapalle, Sree, MD   40 mg at 01/29/24 2112   conjugated estrogens  (PREMARIN ) vaginal cream 1 Applicatorful  1 Applicatorful Vaginal Q M,W,F Jadapalle, Sree, MD   1 Applicatorful at 01/24/24 2100   cyanocobalamin  (VITAMIN B12) tablet 1,000 mcg  1,000 mcg Oral Daily Jadapalle, Sree, MD   1,000 mcg at 01/30/24 9089   diphenhydrAMINE  (BENADRYL ) capsule 25-50 mg  25-50 mg Oral BID PRN Millington, Matthew E, PA-C   25 mg at 01/27/24 1236   haloperidol  (HALDOL ) tablet 5 mg  5 mg Oral TID PRN Jadapalle, Sree, MD       And   diphenhydrAMINE  (BENADRYL ) capsule 50 mg  50 mg  Oral TID PRN Jadapalle, Sree, MD   50 mg at 01/25/24 2200   haloperidol  lactate (HALDOL ) injection 5 mg  5 mg Intramuscular TID PRN Jadapalle, Sree, MD       And   diphenhydrAMINE  (BENADRYL ) injection 50 mg  50 mg Intramuscular TID PRN Jadapalle, Sree, MD       And   LORazepam  (ATIVAN ) injection 2 mg  2 mg Intramuscular TID PRN Jadapalle, Sree, MD       haloperidol  lactate (HALDOL ) injection 10 mg  10 mg Intramuscular TID PRN Jadapalle, Sree, MD       And   diphenhydrAMINE  (BENADRYL ) injection 50 mg  50 mg Intramuscular TID PRN Jadapalle, Sree, MD       And   LORazepam  (ATIVAN ) injection 2 mg  2 mg Intramuscular TID PRN Jadapalle, Sree, MD       escitalopram  (LEXAPRO ) tablet 10 mg  10 mg Oral Daily Millington, Matthew E, PA-C   10 mg at 01/30/24 9089   fludrocortisone  (FLORINEF ) tablet 100 mcg  100 mcg Oral Daily Jadapalle, Sree, MD   100 mcg at 01/30/24 9089   folic acid  (FOLVITE ) tablet 1 mg  1 mg Oral Daily Jadapalle, Sree, MD   1 mg at 01/30/24 9089   gabapentin  (NEURONTIN ) capsule 1,200 mg  1,200 mg Oral QHS Jadapalle, Sree, MD   1,200 mg at 01/29/24 2113   indomethacin  (INDOCIN ) capsule 50 mg  50 mg Oral BID WC Jadapalle, Sree, MD   50 mg at 01/30/24 0910   ivabradine  (CORLANOR ) tablet 5 mg  5 mg Oral BID Jadapalle, Sree, MD   5 mg at 01/30/24 9089   magnesium  hydroxide (MILK OF MAGNESIA) suspension 30 mL  30 mL Oral Daily PRN Donnelly Mellow, MD       methenamine  (MANDELAMINE) tablet 1,000 mg  1,000 mg Oral BID Millington, Matthew E, PA-C   1,000 mg at 01/27/24 0835   methotrexate  (RHEUMATREX) tablet 15 mg  15 mg Oral Q Fri Jadapalle, Sree, MD   15 mg at 01/24/24 0830   metoprolol  succinate (TOPROL -XL) 24 hr tablet 12.5 mg  12.5 mg Oral Daily Jadapalle, Sree, MD   12.5 mg at 01/30/24 9090   nortriptyline  (PAMELOR ) capsule 10 mg  10 mg Oral QHS Jadapalle, Sree, MD   10 mg at 01/29/24 2113   phenazopyridine  (PYRIDIUM ) tablet 100 mg  100 mg Oral TID PRN Cleotilde Hoy HERO, NP   100 mg at  01/30/24 9089   QUEtiapine  (SEROQUEL ) tablet 100 mg  100 mg Oral QHS Cleotilde Hoy HERO, NP   100 mg at 01/29/24 2112   sulfamethoxazole -trimethoprim  (  BACTRIM  DS) 800-160 MG per tablet 1 tablet  1 tablet Oral Q12H Jadapalle, Sree, MD   1 tablet at 01/30/24 9090   topiramate  (TOPAMAX ) tablet 50 mg  50 mg Oral TID Jadapalle, Sree, MD   50 mg at 01/30/24 1222    Lab Results:  No results found for this or any previous visit (from the past 48 hours).    Blood Alcohol level:  Lab Results  Component Value Date   [facility] <15 01/22/2024    Metabolic Disorder Labs: No results found for: HGBA1C, MPG No results found for: PROLACTIN No results found for: CHOL, TRIG, HDL, CHOLHDL, VLDL, LDLCALC   Psychiatric Specialty Exam: Appearance: Clean, casually dressed for the day Eye Contact: Good Behavior: Calm Speech: Normal rate, rhythm, tone, and volume Mood: appropriately bright Affect: Congruent Thought Process: linear Thought Content: No hallucinations, paranoia, or delusions; denies SI/HI Attention: good Memory: good Concentration: good Recall: good Fund of Knowledge: Good Language: Good Psychomotor Activity: No agitation or retardation observed Orientation: Fully oriented 3 (person, place, time) Insight: fair Judgment: good  Musculoskeletal: Strength & Muscle Tone: within normal limits Gait & Station: normal Assets  Assets: Housing    Physical Exam: Physical Exam Vitals and nursing note reviewed.  HENT:     Head: Atraumatic.  Eyes:     Extraocular Movements: Extraocular movements intact.  Pulmonary:     Effort: Pulmonary effort is normal.  Neurological:     Mental Status: She is alert and oriented to person, place, and time.    Review of Systems  Genitourinary:  Positive for dysuria.  Psychiatric/Behavioral:  Positive for depression. Negative for hallucinations, substance abuse and suicidal ideas. The patient is nervous/anxious. The patient does not  have insomnia.    Blood pressure 112/65, pulse 72, temperature 98.8 F (37.1 C), resp. rate 18, height 5' 6 (1.676 m), weight 56.9 kg, SpO2 96%. Body mass index is 20.26 kg/m.  Diagnosis: Principal Problem:   Bipolar 2 disorder (HCC)   PLAN: Safety and Monitoring:  -- Voluntary admission to inpatient psychiatric unit for safety, stabilization and treatment  -- Daily contact with patient to assess and evaluate symptoms and progress in treatment  -- Patient's case to be discussed in multi-disciplinary team meeting  -- Observation Level : q15 minute checks  -- Vital signs:  q12 hours  -- Precautions: suicide, elopement, and assault -- Encouraged patient to participate in unit milieu and in scheduled group therapies  2. Psychiatric Diagnoses and Treatment:   Patient is progressing, she did identify the ativan  overdose as a suicide attempt. Pt requires continues psychiatric hospitalization for medication management and stabilization.   Continued improvement noted today, no change in plan of care. Plan for discharge tomorrow.   Patient presents with some improved psychiatric stability today, likely supported by improved rest. She reports better sleep with Seroquel  and appears more linear and appropriate during evaluation. Abilify  was discontinued due to concern it may not have been beneficial for her clinical course. Cultures confirmed resistant urinary tract infection, requiring change of antibiotics; Factor DS was initiated, and Pyridium  was added PRN for pain. Patient is reassured by parallels between her mother's mental health stability on Seroquel  and Paxil, and she is tolerating these medications well. No evidence of disorganization observed today. Patient continues to require this acute inpatient psychiatric setting for medical and psychiatric stabilization.       Unspecified mood disorder:         - Continue Lexapro  to 10 mg daily, discussed transition  to Cymbalta for chronic pain -  Continue nortriptyline  for sleep 10 mg at bedtime - Continue gabapentin  1200 mg nightly which patient reports for sleep and anxiety -Initiate Seroquel  100 mg PO QHS tonight to address disorganization, distress, and poor sleep; evaluate response and consider discontinuing Abilify  if effective   -- The risks/benefits/side-effects/alternatives to this medication were discussed in detail with the patient and time was given for questions. The patient consents to medication trial.                -- Metabolic profile and EKG monitoring obtained while on an atypical antipsychotic (BMI: Lipid Panel: HbgA1c: QTc:)              -- Encouraged patient to participate in unit milieu and in scheduled group therapies                            3. Medical Issues Being Addressed:   home meds reordered as such Atorvastatin  40 mg nightly Premarin  1 application Monday Wednesday Friday Vitamin B12 1000 mcg daily Florinef  100 mcg daily Indomethacin  50 mg twice daily Corlanor  5 mg twice daily Methenamine  1000 mg 4 times daily Metoprolol  12.5 mg daily Methotrexate  15 mg every Friday Topamax  50 mg 3 times daily  Patient was complaining of pain which she believes is a UTI urine was sent for culture.  Spoke with on-call OB GYN Dr. Beverli Schermerhorn sepsis management beneficial for interstitial cystitis includes hydroxyzine  amitriptyline and NSAIDs.  Patient is on hydroxyzine  will try scheduled dose for several days states currently taking nortriptyline  and they are on indomethacin .  If culture is positive we will treat for UTI. Culture pending pt prefers benadryl  to hydroxyzine .   4. Discharge Planning:   -- Social work and case management to assist with discharge planning and identification of hospital follow-up needs prior to discharge  -- Estimated LOS: 5-6  Hoy CHRISTELLA Pinal, NP 01/30/2024, 1:14 PM

## 2024-01-30 NOTE — Progress Notes (Signed)
   01/30/24 1000  Psych Admission Type (Psych Patients Only)  Admission Status Involuntary  Psychosocial Assessment  Patient Complaints None  Eye Contact Fair;Watchful  Facial Expression Anxious  Affect Appropriate to circumstance  Speech Logical/coherent  Interaction Assertive  Motor Activity Hyperactive  Appearance/Hygiene Layered clothes;Unremarkable  Behavior Characteristics Cooperative;Appropriate to situation  Mood Preoccupied;Pleasant (patient states I'm happy, just counting down the days.)  Aggressive Behavior  Effect No apparent injury  Thought Process  Coherency WDL  Content WDL  Delusions None reported or observed  Perception WDL  Hallucination None reported or observed  Judgment WDL  Confusion None  Danger to Self  Current suicidal ideation? Denies  Agreement Not to Harm Self Yes  Description of Agreement Verbal  Danger to Others  Danger to Others None reported or observed   Patient's goal for today, per her self-inventory is setting boundaries with certain family members, in which being hyper focused on those people (what they're thinking, saying, etc) vs. Being focused on me will help her achieve her goal.

## 2024-01-30 NOTE — Group Note (Signed)
 Date:  01/30/2024 Time:  8:37 PM  Group Topic/Focus:  Identifying Needs:   The focus of this group is to help patients identify their personal needs that have been historically problematic and identify healthy behaviors to address their needs.    Participation Level:  Did Not Attend  Participation Quality:  Did Not Attend  Affect:  Did Not Attend  Cognitive:  Did Not Attend  Insight: None  Engagement in Group:  None  Modes of Intervention:  Did Not Attend  Additional Comments:    Laymon ONEIDA Finder 01/30/2024, 8:37 PM

## 2024-01-31 LAB — LIPID PANEL
Cholesterol: 139 mg/dL (ref 0–200)
HDL: 60 mg/dL (ref >40)
LDL Cholesterol: 71 mg/dL (ref 0–99)
Total CHOL/HDL Ratio: 2.3 ratio
Triglycerides: 40 mg/dL (ref <150)
VLDL: 8 mg/dL (ref 0–40)

## 2024-01-31 LAB — HEMOGLOBIN A1C
Hgb A1c MFr Bld: 5 % (ref 4.8–5.6)
Mean Plasma Glucose: 96.8 mg/dL

## 2024-01-31 MED ORDER — PHENAZOPYRIDINE HCL 100 MG PO TABS: 100.0000 mg | ORAL_TABLET | Freq: Three times a day (TID) | ORAL | 0 refills | Status: AC | PRN

## 2024-01-31 MED ORDER — ACYCLOVIR 200 MG PO CAPS: 400.0000 mg | ORAL_CAPSULE | Freq: Three times a day (TID) | ORAL | 0 refills | Status: AC

## 2024-01-31 MED ORDER — ESCITALOPRAM OXALATE 10 MG PO TABS: 10.0000 mg | ORAL_TABLET | Freq: Every day | ORAL | 0 refills | Status: DC

## 2024-01-31 MED ORDER — QUETIAPINE FUMARATE 100 MG PO TABS: 100.0000 mg | ORAL_TABLET | Freq: Every day | ORAL | 0 refills | Status: AC

## 2024-01-31 NOTE — Progress Notes (Signed)
   01/30/24 1135  Spiritual Encounters  Type of Visit Initial  Care provided to: Patient  Referral source Chaplain assessment  Reason for visit Routine spiritual support  OnCall Visit No   While rounding on unit I visited with Lisa Clay to offer support.  Lisa Clay debriefed with me about life stressors, specifically serving as primary caregiver to mother in health decline. She also has a 53yo daughter. She processed around dynamics of these relationships. She shared her history with community of faith and sense that that has been less than accessible.  I provided compassionate, non-anxious presence. I offered active and reflective listening, drawing upon use of self and my own experience with mother needing care. I emphasized Lisa Clay's own self-care and appropriateness for seeking support. I invited her reflection on managing boundaries, possibility of over-functioning, and staying in the present. I offered possible alternatives to support her faith (eg, online worship) and affirmed her faith.  Lisa Orren L. Clay, M.Div [phone]

## 2024-01-31 NOTE — Discharge Summary (Signed)
 Physician Discharge Summary Note  Patient:  Lisa Clay is an 53 y.o., female MRN:  [id number] DOB:  04-Mar-1971 Patient phone:  [phone] (home)  Patient address:   [address] [place] [address],   Total time spent: 40 min Date of Admission:  01/23/2024 Date of Discharge: 01/31/24  Reason for Admission:  Patient is a 53 year old female who presents under involuntary commitment (IVC) via local law enforcement for evaluation of suicidal ideation and agitation. Per IVC documentation and law enforcement report, patient allegedly went to a neighbor's residence requesting a firearm to kill herself. On arrival to the ED, she was markedly agitated and required IM prns for agitation.  During initial ED evaluation, patient was noted to be hostile, repeatedly requesting that [name] be contacted, later stating her ex-husband is affiliated with [facility] PD. When questioned about the events leading to presentation, she initially reported that her son was not home and she went to her neighbor's house to obtain a key. She then gave inconsistent accounts, at one point saying she was at home and woke up unable to find her son, later stating her son had been gone since Friday. When confronted regarding the reported request for a gun, she became defensive and denied the allegation, and later indicates that she does not remember she does endorse writing a will however denies that it was a suicide note. She has poor insight into recent events. She was minimally engaged during the interview, demonstrated thought blocking, and appeared frustrated with the admission process. ED provider documented concern for possible bipolar disorder, though patient's limited participation precluded full diagnostic clarification. She was agreeable to increasing Lexapro  for depressive symptoms and initiating a low dose of Abilify  for mood stabilization. Notably, she sustained a fall in the ED; head CT was  negative for acute findings. She expressed concern for UTI, urinalysis was ordered.   Principal Problem: Bipolar 2 disorder [facility]) Discharge Diagnoses: Principal Problem:   Bipolar 2 disorder (HCC) Active Problems:   UTI (urinary tract infection)  Psychiatric History:  Information collected from patient   Prev Dx/Sx: Unknown Current Psych Provider: Denies Home Meds (current): Denies Previous Med Trials: Denies Therapy: Denies   Prior Psych Hospitalization: Denies Prior Self Harm: At age 24 Prior Violence: Denies   Family Psych History: Denies Family Hx suicide: Denies   Social History:   Educational Hx: Bachelor's degree in nursing Occupational Hx: Unemployed Legal Hx: Denies Living Situation: Lives with his 57 year old son Spiritual Hx: Denies Access to weapons/lethal means: Denies   Substance History Alcohol: Denies   Tobacco: Denies Illicit drugs: Denies Prescription drug abuse: Denies Rehab hx: Denies  Social History:  Social History   Substance and Sexual Activity  Alcohol Use No   Comment: rare     Social History   Substance and Sexual Activity  Drug Use No    Social History   Socioeconomic History   Marital status: Divorced    Spouse name: Not on file   Number of children: Not on file   Years of education: Not on file   Highest education level: Not on file  Occupational History   Not on file  Tobacco Use   Smoking status: Never   Smokeless tobacco: Never   Tobacco comments:    tobacco use - no   Vaping Use   Vaping status: Never Used  Substance and Sexual Activity   Alcohol use: No    Comment: rare   Drug use: No   Sexual activity:  Not on file  Other Topics Concern   Not on file  Social History Narrative   Single; full time; does not get regular exercise.       Patient is disabled   Social Drivers of Corporate investment banker Strain: Patient Declined (11/14/2022)   Received from [facility]   Overall  Financial Resource Strain (CARDIA)    Difficulty of Paying Living Expenses: Patient declined  Food Insecurity: Patient Declined (01/23/2024)   Hunger Vital Sign    Worried About Running Out of Food in the Last Year: Patient declined    Ran Out of Food in the Last Year: Patient declined  Transportation Needs: Patient Declined (01/23/2024)   PRAPARE - Administrator, Civil Service (Medical): Patient declined    Lack of Transportation (Non-Medical): Patient declined  Physical Activity: Not on file  Stress: Not on file  Social Connections: Socially Isolated (01/23/2024)   Social Connection and Isolation Panel    Frequency of Communication with Friends and Family: Three times a week    Frequency of Social Gatherings with Friends and Family: Three times a week    Attends Religious Services: Never    Active Member of Clubs or Organizations: No    Attends Banker Meetings: Never    Marital Status: Separated   Past Medical History:  Past Medical History:  Diagnosis Date   Atrial tachycardia (HCC)    Dysrhythmia    Hx: PSVT. Current: Postural Orthostatic Tachycardia Syndrome, Inappropriate Sinus Node Tachycardia   Headache    migraines, rare.   Kidney stones    Palpitations    [facility] 2/12   Shortness of breath dyspnea    on exertion, secondary to heart issues   Sinus tachycardia    [facility] 2/12   Syncope    secondary to heart issues    Past Surgical History:  Procedure Laterality Date   BREAST BIOPSY Left 05/11/2013   stereo biopsy   CARDIAC ELECTROPHYSIOLOGY STUDY AND ABLATION  04/24/11   [facility]   CLOSED MANIPULATION SHOULDER WITH STERIOD INJECTION Right 02/16/2015   Procedure: CLOSED MANIPULATION SHOULDER WITH STEROID INJECTION;  Surgeon: Norleen JINNY Maltos, MD;  Location: [facility];  Service: Orthopedics;  Laterality: Right;   FINGER SURGERY Right 12/25/2017   explorative surgery on R5th PIP    TONSILLECTOMY     Family History:  Family  History  Problem Relation Age of Onset   Heart attack Maternal Grandmother 50       deceased   Hypertension Father        and high cholesterol    Hypertension Mother        and high cholesterol    Breast cancer Neg Hx     Hospital Course:   Patient initially presented to this setting for psychiatric treatment voluntarily/per IVC petition following intentional overdose of Ativan  and another medication, accompanied by a suicidal post ("peace out") on [employer]. Inpatient psychiatric hospitalization was indicated related to suicide attempt, disorganized thought process, poor sleep, and psychosocial stressors including family discord and caregiver stress related to her mother's dementia.  Patient was admitted to the acute inpatient psychiatric unit and provided a comprehensive treatment program including medication management, supportive therapy, and safety monitoring.  On admission, she was disorganized, anxious, and experiencing significant psychosocial stressors including conflict with her children and caregiving burden. She initially minimized the overdose but later identified it as a suicide attempt.  Medication management: Continued Abilify  2 mg daily,  Lexapro  10 mg daily (with discussion to transition to Cymbalta for chronic pain), Nortriptyline  10 mg QHS for sleep, and Gabapentin  1200 mg nightly for sleep/anxiety. Initiated Seroquel  100 mg QHS to address disorganization, distress, and insomnia. Abilify  was ultimately discontinued as good benefit with Seroquel .     Patient reported improved anxiety with coping skills, better sleep, and resolution of suicidal ideation within the monitored setting. Disorganization decreased with treatment, though symptoms were noted to be conditional on continued structure and support. Safety monitoring: No unsafe or aggressive behaviors were noted following admission.  History was notable for family discord with both children, chronic caregiver stress related  to mother's dementia, and no significant prior psychiatric treatment history. Protective Factors: Treatment engagement, denial of SI/HI at discharge, improved sleep and anxiety, supportive environment, physically healthy, supportive neighbors, has son at home  At discharge, patient denied SI/HI, demonstrated improved anxiety with coping skills, and had stabilized mood and sleep. She acknowledged her overdose as a suicide attempt and participated in safety planning. Though residual disorganization remains, she is improved compared to admission and stable for transition.  Currently, all modifiable risk of harm to self/harm to others have been addressed and patient is no longer appropriate for the acute inpatient setting and is able to continue treatment for mental health needs in the community with the supports as indicated below.  Patient is educated and verbalized understanding of discharge plan of care including medications, follow-up appointments, mental health resources and further crisis services in the community.  He is instructed to call 911 or present to the nearest emergency room should he experience any decompensation in mood, disturbance of bowel or return of suicidal/homicidal ideations.  Patient verbalizes understanding of this education and agrees to this plan of care  - Continue Lexapro  to 10 mg daily, discussed transition to Cymbalta for chronic pain - Continue nortriptyline  for sleep 10 mg at bedtime - Continue gabapentin  1200 mg nightly which patient reports for sleep and anxiety -Initiate Seroquel  100 mg PO QHS tonight to address disorganization, distress, and poor sleep; discontinue Abilify      Psychiatric Specialty Exam: Appearance: Clean, casually dressed for the day Eye Contact: Good Behavior: Calm Speech: Normal rate, rhythm, tone, and volume Mood: appropriately bright Affect: Congruent Thought Process: linear Thought Content: No hallucinations, paranoia, or delusions;  denies SI/HI Attention: good Memory: good Concentration: good Recall: good Fund of Knowledge: Good Language: Good Psychomotor Activity: No agitation or retardation observed Orientation: Fully oriented 3 (person, place, time) Insight: fair Judgment: good   Psychomotor Activity  Psychomotor Activity:No data recorded Musculoskeletal: Strength & Muscle Tone: within normal limits Gait & Station: normal Assets  Assets: Housing   Sleep  Sleep:No data recorded   Physical Exam: Physical Exam ROS Blood pressure 100/63, pulse 70, temperature 98.1 F (36.7 C), resp. rate 12, height 5' 6 (1.676 m), weight 56.9 kg, SpO2 97%. Body mass index is 20.26 kg/m.   Social History   Tobacco Use  Smoking Status Never  Smokeless Tobacco Never  Tobacco Comments   tobacco use - no    Tobacco Cessation:  N/A, patient does not currently use tobacco products   Blood Alcohol level:  Lab Results  Component Value Date   [facility] <15 01/22/2024    Metabolic Disorder Labs:  No results found for: HGBA1C, MPG No results found for: PROLACTIN Lab Results  Component Value Date   CHOL 139 01/31/2024   TRIG 40 01/31/2024   HDL 60 01/31/2024   CHOLHDL 2.3 01/31/2024  VLDL 8 01/31/2024   LDLCALC 71 01/31/2024    See Psychiatric Specialty Exam and Suicide Risk Assessment completed by Attending Physician prior to discharge.  Discharge destination:  Home  Is patient on multiple antipsychotic therapies at discharge:  No   Has Patient had three or more failed trials of antipsychotic monotherapy by history:  No  Recommended Plan for Multiple Antipsychotic Therapies: NA  Discharge Instructions     Diet - low sodium heart healthy   Complete by: As directed    Increase activity slowly   Complete by: As directed         Follow-up Information     [facility] Follow up.   Why: Virtual assessment for therapy and medication management is 02/07/24 at 8 AM. Contact information: [address]  [address] [place] [address] [phone]         Devere MARLA Axe, [facility]. Go to.   Why: In person therapy appointment is 02/05/24 at 3 PM w/ Susan K. Axe, [facility]. Contact information: [address] [place], [place] [address]   Office: [phone] Fax:                In regards to pt UTI, symptoms improved/resolved during stay, Reviewed case with Dr. Sherre related to complex UTI history, reviewed treatment history as well as labs. Instructed appropriate course of treatment underway with no further recommendations. Patient should follow up with PCP soon after discharge.   Signed: Hoy CHRISTELLA Pinal, NP 01/31/2024, 9:18 AM

## 2024-01-31 NOTE — Progress Notes (Signed)
  [facility] Adult Case Management Discharge Plan :  Will you be returning to the same living situation after discharge:  Yes,  Patient to return home.  At discharge, do you have transportation home?: Yes,  Patient's friend to provide transportation.  Do you have the ability to pay for your medications: Yes,  [place] MEDICAID PREPAID HEALTH PLAN / [place] MEDICAID [place] COMPLETE HEALTH  Release of information consent forms completed and in the chart;  Patient's signature needed at discharge.  Patient to Follow up at:  Follow-up Information     [facility] Follow up.   Why: Virtual assessment for therapy and medication management is 02/07/24 at 8 AM. Contact information: [address]  [address] [place] [address] [phone]         Devere MARLA Axe, [facility]. Go to.   Why: In person therapy appointment is 02/05/24 at 3 PM w/ Susan K. Axe, [facility]. Contact information: [address] [place], [place] [address]   Office: [phone] Fax:                Next level of care provider has access to [facility] Link:yes  Safety Planning and Suicide Prevention discussed: Yes,  Education Completed;  Rosaline FURY [phone], Friend, has been identified by the patient as the family member/significant other with whom the patient will be residing, and identified as the person(s) who will aid the patient in the event of a mental health crisis (suicidal ideations/suicide attempt).  With written consent from the patient, the family member/significant other has been provided the following suicide prevention education, prior to the and/or following the discharge of the patient.     Has patient been referred to the Quitline?: Patient does not use tobacco/nicotine products  Patient has been referred for addiction treatment: No known substance use disorder.5  Alveta CHRISTELLA Kerns, LCSW 01/31/2024, 9:12 AM

## 2024-01-31 NOTE — Progress Notes (Signed)
 [facility] Discharge Suicide Risk Assessment   Principal Problem: Bipolar 2 disorder [facility]) Discharge Diagnoses: Principal Problem:   Bipolar 2 disorder (HCC) Active Problems:   UTI (urinary tract infection)  30 minutes Total Time spent with patient:   Musculoskeletal: Strength & Muscle Tone: within normal limits Gait & Station: normal   Psychiatric Specialty Exam Appearance: Clean, casually dressed for the day Eye Contact: Good Behavior: Calm Speech: Normal rate, rhythm, tone, and volume Mood: appropriately bright Affect: Congruent Thought Process: linear Thought Content: No hallucinations, paranoia, or delusions; denies SI/HI Attention: good Memory: good Concentration: good Recall: good Fund of Knowledge: Good Language: Good Psychomotor Activity: No agitation or retardation observed Orientation: Fully oriented 3 (person, place, time) Insight: fair Judgment: good  Sleep  Sleep:No data recorded Estimated Sleeping Duration (Last 24 Hours): 7.25-10.25 hours  Physical Exam: Physical Exam ROS Blood pressure 100/63, pulse 70, temperature 98.1 F (36.7 C), resp. rate 12, height 5' 6 (1.676 m), weight 56.9 kg, SpO2 97%. Body mass index is 20.26 kg/m.  Mental Status Per Nursing Assessment::   On Admission:  NA  Demographic Factors:  Caucasian and Living alone  Loss Factors: Decrease in vocational status  Historical Factors: Prior suicide attempts  Risk Reduction Factors:   Responsible for children under 44 years of age, Sense of responsibility to family, Positive social support, Positive therapeutic relationship, and Positive coping skills or problem solving skills  Continued Clinical Symptoms:  Previous Psychiatric Diagnoses and Treatments  Cognitive Features That Contribute To Risk:  None    Suicide Risk:  Mild:  Suicidal ideation of limited frequency, intensity, duration, and specificity.  There are no identifiable plans, no associated intent, mild dysphoria  and related symptoms, good self-control (both objective and subjective assessment), few other risk factors, and identifiable protective factors, including available and accessible social support.   Follow-up Information     [facility] Follow up.   Why: Virtual assessment for therapy and medication management is 02/07/24 at 8 AM. Contact information: [address]  [address] [place] [address] [phone]         Devere MARLA Axe, [facility]. Go to.   Why: In person therapy appointment is 02/05/24 at 3 PM w/ Susan K. Axe, [facility]. Contact information: [address] [place], [place] [address]   Office: [phone] Fax:                 Lisa Clay Lisa Pinal, NP 01/31/2024, 9:16 AM

## 2024-01-31 NOTE — Group Note (Signed)
 Date:  01/31/2024 Time:  10:56 AM  Group Topic/Focus:  Goals Group:   The focus of this group is to help patients establish daily goals to achieve during treatment and discuss how the patient can incorporate goal setting into their daily lives to aide in recovery.    Participation Level:  Active  Participation Quality:  Appropriate  Affect:  Appropriate  Cognitive:  Appropriate  Insight: Appropriate  Engagement in Group:  Engaged  Modes of Intervention:  Discussion, Education, and Support  Additional Comments:    Deitra Caron Mainland 01/31/2024, 10:56 AM

## 2024-01-31 NOTE — Plan of Care (Signed)
 Patient alert and oriented x 4.  Affect is pleasant and calm. Denies anxiety, SI/HI or AVH.  Patient states they will try to keep themselves safe when they return home.  Reviewed discharge instructions with patient including follow up appointment with provider, medication and prescriptions.  Questions answered and understanding verbalized.  Discharge packet given.  All belongings returned to patient after verification completed by staff.    Patient escorted by staff off unit at this time stable without complaint.    Problem: Education: Goal: Knowledge of [place] [facility] Education information/materials will improve Outcome: Adequate for Discharge Goal: Emotional status will improve Outcome: Adequate for Discharge Goal: Mental status will improve Outcome: Adequate for Discharge Goal: Verbalization of understanding the information provided will improve Outcome: Adequate for Discharge   Problem: Activity: Goal: Interest or engagement in activities will improve Outcome: Adequate for Discharge Goal: Sleeping patterns will improve Outcome: Adequate for Discharge   Problem: Coping: Goal: Ability to verbalize frustrations and anger appropriately will improve Outcome: Adequate for Discharge Goal: Ability to demonstrate self-control will improve Outcome: Adequate for Discharge   Problem: Health Behavior/Discharge Planning: Goal: Identification of resources available to assist in meeting health care needs will improve Outcome: Adequate for Discharge Goal: Compliance with treatment plan for underlying cause of condition will improve Outcome: Adequate for Discharge   Problem: Physical Regulation: Goal: Ability to maintain clinical measurements within normal limits will improve Outcome: Adequate for Discharge   Problem: Safety: Goal: Periods of time without injury will increase Outcome: Adequate for Discharge   Problem: Education: Goal: Ability to state activities that reduce stress  will improve Outcome: Adequate for Discharge   Problem: Coping: Goal: Ability to identify and develop effective coping behavior will improve Outcome: Adequate for Discharge   Problem: Self-Concept: Goal: Ability to identify factors that promote anxiety will improve Outcome: Adequate for Discharge Goal: Level of anxiety will decrease Outcome: Adequate for Discharge Goal: Ability to modify response to factors that promote anxiety will improve Outcome: Adequate for Discharge   Problem: Education: Goal: Utilization of techniques to improve thought processes will improve Outcome: Adequate for Discharge Goal: Knowledge of the prescribed therapeutic regimen will improve Outcome: Adequate for Discharge   Problem: Activity: Goal: Interest or engagement in leisure activities will improve Outcome: Adequate for Discharge Goal: Imbalance in normal sleep/wake cycle will improve Outcome: Adequate for Discharge   Problem: Coping: Goal: Coping ability will improve Outcome: Adequate for Discharge Goal: Will verbalize feelings Outcome: Adequate for Discharge   Problem: Health Behavior/Discharge Planning: Goal: Ability to make decisions will improve Outcome: Adequate for Discharge Goal: Compliance with therapeutic regimen will improve Outcome: Adequate for Discharge   Problem: Role Relationship: Goal: Will demonstrate positive changes in social behaviors and relationships Outcome: Adequate for Discharge   Problem: Safety: Goal: Ability to disclose and discuss suicidal ideas will improve Outcome: Adequate for Discharge Goal: Ability to identify and utilize support systems that promote safety will improve Outcome: Adequate for Discharge   Problem: Self-Concept: Goal: Will verbalize positive feelings about self Outcome: Adequate for Discharge Goal: Level of anxiety will decrease Outcome: Adequate for Discharge   Problem: Education: Goal: Ability to make informed decisions regarding  treatment will improve Outcome: Adequate for Discharge   Problem: Coping: Goal: Coping ability will improve Outcome: Adequate for Discharge   Problem: Health Behavior/Discharge Planning: Goal: Identification of resources available to assist in meeting health care needs will improve Outcome: Adequate for Discharge   Problem: Medication: Goal: Compliance with prescribed medication regimen will improve  Outcome: Adequate for Discharge   Problem: Self-Concept: Goal: Ability to disclose and discuss suicidal ideas will improve Outcome: Adequate for Discharge Goal: Will verbalize positive feelings about self Outcome: Adequate for Discharge

## 2024-03-12 ENCOUNTER — Ambulatory Visit (INDEPENDENT_AMBULATORY_CARE_PROVIDER_SITE_OTHER): Admitting: Nurse Practitioner

## 2024-03-12 VITALS — BP 119/73 | HR 76 | Ht 66.0 in | Wt 136.8 lb

## 2024-03-12 DIAGNOSIS — I4892 Unspecified atrial flutter: Secondary | ICD-10-CM | POA: Diagnosis not present

## 2024-03-12 DIAGNOSIS — Z7689 Persons encountering health services in other specified circumstances: Secondary | ICD-10-CM | POA: Diagnosis not present

## 2024-03-12 DIAGNOSIS — G43809 Other migraine, not intractable, without status migrainosus: Secondary | ICD-10-CM | POA: Diagnosis not present

## 2024-03-12 DIAGNOSIS — F3181 Bipolar II disorder: Secondary | ICD-10-CM

## 2024-03-12 DIAGNOSIS — M47819 Spondylosis without myelopathy or radiculopathy, site unspecified: Secondary | ICD-10-CM

## 2024-03-12 MED ORDER — ESCITALOPRAM OXALATE 20 MG PO TABS: 20.0000 mg | ORAL_TABLET | Freq: Every day | ORAL | Status: AC

## 2024-03-12 NOTE — Assessment & Plan Note (Signed)
 Chronic.  Controlled with Topamax  50mg  TID.  Continue with current regimen.

## 2024-03-12 NOTE — Assessment & Plan Note (Signed)
 Chronic.  Well controlled on Metoprolol .  She is transitioning to a new EP.

## 2024-03-12 NOTE — Progress Notes (Signed)
 BP 119/73 (BP Location: Left Arm, Patient Position: Sitting, Cuff Size: Normal)   Pulse 76   Ht 5' 6 (1.676 m)   Wt 136 lb 12.8 oz (62.1 kg)   SpO2 97%   BMI 22.08 kg/m    Subjective:    Patient ID: Lisa Clay, female    DOB: 01-Mar-1971, 53 y.o.   MRN: [id number]  HPI: Lisa Clay is a 53 y.o. female  Chief Complaint  Patient presents with   Establish Care   Annual Exam   Patient presents to clinic to establish care with new PCP.  Introduced to Publishing rights manager role and practice setting.  All questions answered.  Discussed provider/patient relationship and expectations.  Patient reports a history of bladder pain and recurrent UTI- see's Uro gynecology, Migraines, Bipolar, Aflutter, Bipolar and POTS. Patient was seen with [facility] and has an appointment on October 15.   Patient denies a history of: Hypertension, Elevated Cholesterol, Diabetes, Thyroid problems, and Abdominal problems.   MOOD Patient was recently hospital in August for behavioral health concern.  States she has been having dry mouth and ulcers on both sides of her tongue.  She is having a lot of brain fog.  She is starting to have some tremors.  She noticed it in her thumbs when she is holding her phone her thumb will shake. She was taking 100mg  but it wasn't helping her sleep.  She was told when she went home to increase the dose to 150mg .  When she had her first phone call with [facility] she told them she wasn't sleeping and changed the prescription to 150mg .  Her biggest issue at this point is her sleep.  The lack of sleep has been an ongoing issue for several years.   She has been on the Nortriptyline  from her Rheumatologist. She is having issues with her son that boiled over.  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    RHEUMATOLOGY She is on Flexeril, Methotrexate , Nortrptyline, and Cosentyx.  She has a gene indicative of a Rheumatology disorder.    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   Tendinitis of right shoulder 12/29/2014   Adhesive capsulitis 02/16/2015   Atrial flutter (HCC) 09/05/2012   CFIDS (chronic fatigue and immune dysfunction syndrome) 09/05/2012   Inappropriate sinus tachycardia 09/05/2012   Postural orthostatic tachycardia syndrome 08/30/2011   Supraventricular arrhythmia 05/28/2011   Episode of syncope 05/11/2014   Other shoulder lesions, right shoulder 12/29/2014   Encounter for gynecological examination without abnormal finding 04/06/2015   Mixed incontinence 04/06/2015   Fast heart beat 08/30/2011   Bipolar 2 disorder (HCC) 01/23/2024   UTI (urinary tract infection) 01/30/2024   Seronegative spondyloarthropathy 10/24/2021   Chronic venous insufficiency 03/21/2017   Hyperlipidemia 08/19/2019   Migraine without status migrainosus, not intractable 08/15/2018   Resolved Ambulatory Problems    Diagnosis Date Noted   No Resolved Ambulatory Problems   Past Medical History:  Diagnosis Date   Atrial tachycardia    Dysrhythmia    Headache    Kidney stones    Shortness of breath dyspnea    Sinus tachycardia    Syncope    Past Surgical History:  Procedure Laterality Date   BREAST BIOPSY Left 05/11/2013  stereo biopsy   CARDIAC ELECTROPHYSIOLOGY STUDY AND ABLATION  04/24/11   [facility]   CLOSED MANIPULATION SHOULDER WITH STERIOD INJECTION Right 02/16/2015   Procedure: CLOSED MANIPULATION SHOULDER WITH STEROID INJECTION;  Surgeon: Norleen JINNY Maltos, MD;  Location: [facility];  Service: Orthopedics;  Laterality: Right;   FINGER SURGERY Right 12/25/2017   explorative surgery on R5th PIP    TONSILLECTOMY     Family History  Problem Relation Age of Onset   Heart attack  Maternal Grandmother 50       deceased   Hypertension Father        and high cholesterol    Hypertension Mother        and high cholesterol    Breast cancer Neg Hx      Review of Systems  HENT:         Dry mouth and ulcers on tongue    Per HPI unless specifically indicated above     Objective:    BP 119/73 (BP Location: Left Arm, Patient Position: Sitting, Cuff Size: Normal)   Pulse 76   Ht 5' 6 (1.676 m)   Wt 136 lb 12.8 oz (62.1 kg)   SpO2 97%   BMI 22.08 kg/m   Wt Readings from Last 3 Encounters:  03/12/24 136 lb 12.8 oz (62.1 kg)  01/23/24 125 lb 8 oz (56.9 kg)  04/23/23 122 lb (55.3 kg)    Physical Exam Vitals and nursing note reviewed.  Constitutional:      General: She is not in acute distress.    Appearance: Normal appearance. She is normal weight. She is not ill-appearing, toxic-appearing or diaphoretic.  HENT:     Head: Normocephalic.     Right Ear: External ear normal.     Left Ear: External ear normal.     Nose: Nose normal.     Mouth/Throat:     Mouth: Mucous membranes are moist.     Pharynx: Oropharynx is clear.  Eyes:     General:        Right eye: No discharge.        Left eye: No discharge.     Extraocular Movements: Extraocular movements intact.     Conjunctiva/sclera: Conjunctivae normal.     Pupils: Pupils are equal, round, and reactive to light.  Cardiovascular:     Rate and Rhythm: Normal rate and regular rhythm.     Heart sounds: No murmur heard. Pulmonary:     Effort: Pulmonary effort is normal. No respiratory distress.     Breath sounds: Normal breath sounds. No wheezing or rales.  Musculoskeletal:     Cervical back: Normal range of motion and neck supple.  Skin:    General: Skin is warm and dry.     Capillary Refill: Capillary refill takes less than 2 seconds.  Neurological:     General: No focal deficit present.     Mental Status: She is alert and oriented to person, place, and time. Mental status is at baseline.   Psychiatric:        Mood and Affect: Mood normal.        Behavior: Behavior normal.        Thought Content: Thought content normal.        Judgment: Judgment normal.     Results for orders placed or performed during the hospital encounter of 01/23/24  Urinalysis, Routine w reflex microscopic -Urine, Unspecified Source   Collection Time: 01/24/24 11:30 AM  Result Value Ref  Range   Color, Urine YELLOW (A) YELLOW   APPearance HAZY (A) CLEAR   Specific Gravity, Urine 1.019 1.005 - 1.030   pH 5.0 5.0 - 8.0   Glucose, UA NEGATIVE NEGATIVE mg/dL   Hgb urine dipstick SMALL (A) NEGATIVE   Bilirubin Urine NEGATIVE NEGATIVE   Ketones, ur NEGATIVE NEGATIVE mg/dL   Protein, ur >=699 (A) NEGATIVE mg/dL   Nitrite NEGATIVE NEGATIVE   Leukocytes,Ua NEGATIVE NEGATIVE   RBC / HPF 21-50 0 - 5 RBC/hpf   WBC, UA >50 0 - 5 WBC/hpf   Bacteria, UA RARE (A) NONE SEEN   Squamous Epithelial / HPF 0 0 - 5 /HPF   Mucus PRESENT   Urine Culture (for pregnant, neutropenic or urologic patients or patients with an indwelling urinary catheter)   Collection Time: 01/25/24  1:37 PM   Specimen: Urine, Clean Catch  Result Value Ref Range   Specimen Description      URINE, CLEAN CATCH Performed at [facility], [address] [place], [place] [address]    Special Requests      NONE Performed at [facility], [address] [place], [place] [address]    Culture (A)     50,000 COLONIES/mL ESCHERICHIA COLI Confirmed Extended Spectrum Beta-Lactamase Producer (ESBL).  In bloodstream infections from ESBL organisms, carbapenems are preferred over piperacillin/tazobactam. They are shown to have a lower risk of mortality.    Report Status 01/28/2024 FINAL    Organism ID, Bacteria ESCHERICHIA COLI (A)       Susceptibility   Escherichia coli - MIC*    AMPICILLIN >=32 RESISTANT Resistant     CEFAZOLIN (URINE) Value in next row Resistant      >=32 RESISTANTThis is a modified FDA-approved test  that has been validated and its performance characteristics determined by the reporting laboratory.  This laboratory is certified under the Clinical Laboratory Improvement Amendments CLIA as qualified to perform high complexity clinical laboratory testing.    CEFEPIME Value in next row Sensitive      >=32 RESISTANTThis is a modified FDA-approved test that has been validated and its performance characteristics determined by the reporting laboratory.  This laboratory is certified under the Clinical Laboratory Improvement Amendments CLIA as qualified to perform high complexity clinical laboratory testing.    ERTAPENEM Value in next row Sensitive      >=32 RESISTANTThis is a modified FDA-approved test that has been validated and its performance characteristics determined by the reporting laboratory.  This laboratory is certified under the Clinical Laboratory Improvement Amendments CLIA as qualified to perform high complexity clinical laboratory testing.    CEFTRIAXONE Value in next row Sensitive      >=32 RESISTANTThis is a modified FDA-approved test that has been validated and its performance characteristics determined by the reporting laboratory.  This laboratory is certified under the Clinical Laboratory Improvement Amendments CLIA as qualified to perform high complexity clinical laboratory testing.    CIPROFLOXACIN  Value in next row Resistant      >=32 RESISTANTThis is a modified FDA-approved test that has been validated and its performance characteristics determined by the reporting laboratory.  This laboratory is certified under the Clinical Laboratory Improvement Amendments CLIA as qualified to perform high complexity clinical laboratory testing.    GENTAMICIN Value in next row Sensitive      >=32 RESISTANTThis is a modified FDA-approved test that has been validated and its performance characteristics determined by the reporting laboratory.  This laboratory is certified under the Clinical Laboratory  Improvement Amendments CLIA as qualified to perform high complexity clinical laboratory testing.    NITROFURANTOIN Value in next row Resistant      >=32 RESISTANTThis is a modified FDA-approved test that has been validated and its performance characteristics determined by the reporting laboratory.  This laboratory is certified under the Clinical Laboratory Improvement Amendments CLIA as qualified to perform high complexity clinical laboratory testing.    TRIMETH /SULFA  Value in next row Sensitive      >=32 RESISTANTThis is a modified FDA-approved test that has been validated and its performance characteristics determined by the reporting laboratory.  This laboratory is certified under the Clinical Laboratory Improvement Amendments CLIA as qualified to perform high complexity clinical laboratory testing.    AMPICILLIN/SULBACTAM Value in next row Sensitive      >=32 RESISTANTThis is a modified FDA-approved test that has been validated and its performance characteristics determined by the reporting laboratory.  This laboratory is certified under the Clinical Laboratory Improvement Amendments CLIA as qualified to perform high complexity clinical laboratory testing.    PIP/TAZO Value in next row Sensitive ug/mL     <=4 SENSITIVEThis is a modified FDA-approved test that has been validated and its performance characteristics determined by the reporting laboratory.  This laboratory is certified under the Clinical Laboratory Improvement Amendments CLIA as qualified to perform high complexity clinical laboratory testing.    MEROPENEM Value in next row Sensitive      <=4 SENSITIVEThis is a modified FDA-approved test that has been validated and its performance characteristics determined by the reporting laboratory.  This laboratory is certified under the Clinical Laboratory Improvement Amendments CLIA as qualified to perform high complexity clinical laboratory testing.    * 50,000 COLONIES/mL ESCHERICHIA COLI  Basic  metabolic panel with GFR   Collection Time: 01/27/24  7:09 AM  Result Value Ref Range   Sodium 144 135 - 145 mmol/L   Potassium 4.1 3.5 - 5.1 mmol/L   Chloride 111 98 - 111 mmol/L   CO2 25 22 - 32 mmol/L   Glucose, Bld 109 (H) 70 - 99 mg/dL   BUN 31 (H) 6 - 20 mg/dL   Creatinine, Ser 9.15 0.44 - 1.00 mg/dL   Calcium  8.9 8.9 - 10.3 mg/dL   GFR, Estimated >39 >39 mL/min   Anion gap 8 5 - 15  Hemoglobin A1c   Collection Time: 01/31/24  6:55 AM  Result Value Ref Range   Hgb A1c MFr Bld 5.0 4.8 - 5.6 %   Mean Plasma Glucose 96.8 mg/dL  Lipid panel   Collection Time: 01/31/24  6:55 AM  Result Value Ref Range   Cholesterol 139 0 - 200 mg/dL   Triglycerides 40 <849 mg/dL   HDL 60 >59 mg/dL   Total CHOL/HDL Ratio 2.3 RATIO   VLDL 8 0 - 40 mg/dL   LDL Cholesterol 71 0 - 99 mg/dL      Assessment & Plan:   Problem List Items Addressed This Visit       Cardiovascular and Mediastinum   Atrial flutter (HCC) - Primary   Chronic.  Well controlled on Metoprolol .  She is transitioning to a new EP.        Migraine without status migrainosus, not intractable   Chronic.  Controlled with Topamax  50mg  TID.  Continue with current regimen.       Relevant Medications   gabapentin  (NEURONTIN ) 400 MG capsule   cyclobenzaprine (FLEXERIL) 10 MG tablet   topiramate  (TOPAMAX ) 50 MG tablet   escitalopram  (LEXAPRO )  20 MG tablet     Musculoskeletal and Integument   Seronegative spondyloarthropathy   Chronic.  Followed by Rheumatology.  On Cosentyx, Nortriptyline , Methotrexate  and Flexeril.         Other   Bipolar 2 disorder (HCC)   New problem as of August.  She is on Seroquel  150mg  and lexapro  20mg .  Following up with [facility].  Having side effects from the Seroquel .  Encouraged patient to make a sooner appointment with Psychiatrist.      Other Visit Diagnoses       Encounter to establish care            Follow up plan: Return in about 3 months (around 06/12/2024) for Physical and  Fasting labs.   A total of 40 minutes were spent on this encounter today.  When total time is documented, this includes both the face-to-face and non-face-to-face time personally spent before, during and after the visit on the date of the encounter medications, symptoms, specialists and follow up.

## 2024-03-12 NOTE — Assessment & Plan Note (Signed)
 Chronic.  Followed by Rheumatology.  On Cosentyx, Nortriptyline , Methotrexate  and Flexeril.

## 2024-03-12 NOTE — Assessment & Plan Note (Signed)
 New problem as of August.  She is on Seroquel  150mg  and lexapro  20mg .  Following up with [facility].  Having side effects from the Seroquel .  Encouraged patient to make a sooner appointment with Psychiatrist.

## 2024-03-16 ENCOUNTER — Ambulatory Visit: Admitting: Nurse Practitioner

## 2024-04-16 ENCOUNTER — Other Ambulatory Visit: Payer: Self-pay | Admitting: Nurse Practitioner

## 2024-04-16 NOTE — Telephone Encounter (Signed)
 Copied from CRM [id number]. Topic: Clinical - Medication Refill >> Apr 16, 2024  1:16 PM Tysheama G wrote: Medication:   gabapentin  (NEURONTIN ) 400 MG capsule topiramate  (TOPAMAX ) 50 MG tablet   Has the patient contacted their pharmacy? Yes (Agent: If no, request that the patient contact the pharmacy for the refill. If patient does not wish to contact the pharmacy document the reason why and proceed with request.) (Agent: If yes, when and what did the pharmacy advise?)  This is the patient's preferred pharmacy:  [id number] - [place], [place] - [address] [place] [address] Phone: [phone] Fax: [phone]  Is this the correct pharmacy for this prescription? Yes If no, delete pharmacy and type the correct one.   Has the prescription been filled recently? No  Is the patient out of the medication? Yes  Has the patient been seen for an appointment in the last year OR does the patient have an upcoming appointment? Yes  Can we respond through MyChart? Yes  Agent: Please be advised that Rx refills may take up to 3 business days. We ask that you follow-up with your pharmacy.

## 2024-04-17 NOTE — Telephone Encounter (Signed)
 Requested medication (s) are due for refill today -unsure  Requested medication (s) are on the active medication list -yes  Future visit scheduled --yes  Last refill: 03/12/24  Notes to clinic: both Rx listed as historical provider  Requested Prescriptions  Pending Prescriptions Disp Refills   gabapentin  (NEURONTIN ) 400 MG capsule      Sig: Take 1 capsule (400 mg total) by mouth 3 (three) times daily.     Neurology: Anticonvulsants - gabapentin  Passed - 04/17/2024  3:23 PM      Passed - Cr in normal range and within 360 days    Creatinine  Date Value Ref Range Status  09/08/2011 0.79 0.60 - 1.30 mg/dL Final   Creatinine, Ser  Date Value Ref Range Status  01/27/2024 0.84 0.44 - 1.00 mg/dL Final         Passed - Completed PHQ-2 or PHQ-9 in the last 360 days      Passed - Valid encounter within last 12 months    Recent Outpatient Visits           1 month ago Atrial flutter, unspecified type [facility])   [place] [facility] Melvin Pao, NP               topiramate  (TOPAMAX ) 50 MG tablet      Sig: Take 1 tablet (50 mg total) by mouth in the morning, at noon, and at bedtime.     Neurology: Anticonvulsants - topiramate  & zonisamide Failed - 04/17/2024  3:23 PM      Failed - ALT in normal range and within 360 days    ALT  Date Value Ref Range Status  01/22/2024 88 (H) 0 - 44 U/L Final         Failed - AST in normal range and within 360 days    AST  Date Value Ref Range Status  01/22/2024 78 (H) 15 - 41 U/L Final         Passed - Cr in normal range and within 360 days    Creatinine  Date Value Ref Range Status  09/08/2011 0.79 0.60 - 1.30 mg/dL Final   Creatinine, Ser  Date Value Ref Range Status  01/27/2024 0.84 0.44 - 1.00 mg/dL Final         Passed - CO2 in normal range and within 360 days    CO2  Date Value Ref Range Status  01/27/2024 25 22 - 32 mmol/L Final   Co2  Date Value Ref Range Status  09/08/2011 26 21 - 32 mmol/L Final          Passed - Completed PHQ-2 or PHQ-9 in the last 360 days      Passed - Valid encounter within last 12 months    Recent Outpatient Visits           1 month ago Atrial flutter, unspecified type [facility])   [place] [facility] Melvin Pao, NP                 Requested Prescriptions  Pending Prescriptions Disp Refills   gabapentin  (NEURONTIN ) 400 MG capsule      Sig: Take 1 capsule (400 mg total) by mouth 3 (three) times daily.     Neurology: Anticonvulsants - gabapentin  Passed - 04/17/2024  3:23 PM      Passed - Cr in normal range and within 360 days    Creatinine  Date Value Ref Range Status  09/08/2011 0.79 0.60 - 1.30 mg/dL Final  Creatinine, Ser  Date Value Ref Range Status  01/27/2024 0.84 0.44 - 1.00 mg/dL Final         Passed - Completed PHQ-2 or PHQ-9 in the last 360 days      Passed - Valid encounter within last 12 months    Recent Outpatient Visits           1 month ago Atrial flutter, unspecified type [facility])   [place] [facility] Melvin Pao, NP               topiramate  (TOPAMAX ) 50 MG tablet      Sig: Take 1 tablet (50 mg total) by mouth in the morning, at noon, and at bedtime.     Neurology: Anticonvulsants - topiramate  & zonisamide Failed - 04/17/2024  3:23 PM      Failed - ALT in normal range and within 360 days    ALT  Date Value Ref Range Status  01/22/2024 88 (H) 0 - 44 U/L Final         Failed - AST in normal range and within 360 days    AST  Date Value Ref Range Status  01/22/2024 78 (H) 15 - 41 U/L Final         Passed - Cr in normal range and within 360 days    Creatinine  Date Value Ref Range Status  09/08/2011 0.79 0.60 - 1.30 mg/dL Final   Creatinine, Ser  Date Value Ref Range Status  01/27/2024 0.84 0.44 - 1.00 mg/dL Final         Passed - CO2 in normal range and within 360 days    CO2  Date Value Ref Range Status  01/27/2024 25 22 - 32 mmol/L Final   Co2  Date Value  Ref Range Status  09/08/2011 26 21 - 32 mmol/L Final         Passed - Completed PHQ-2 or PHQ-9 in the last 360 days      Passed - Valid encounter within last 12 months    Recent Outpatient Visits           1 month ago Atrial flutter, unspecified type [facility])   [place] [facility] Melvin Pao, NP

## 2024-04-20 MED ORDER — GABAPENTIN 400 MG PO CAPS: 400.0000 mg | ORAL_CAPSULE | Freq: Three times a day (TID) | ORAL | 1 refills | Status: AC

## 2024-04-20 MED ORDER — TOPIRAMATE 50 MG PO TABS: 50.0000 mg | ORAL_TABLET | Freq: Three times a day (TID) | ORAL | 1 refills | Status: AC

## 2024-04-30 ENCOUNTER — Ambulatory Visit (INDEPENDENT_AMBULATORY_CARE_PROVIDER_SITE_OTHER): Admitting: Pediatrics

## 2024-04-30 ENCOUNTER — Encounter: Payer: Self-pay | Admitting: Pediatrics

## 2024-04-30 VITALS — BP 133/82 | HR 101 | Ht 66.0 in | Wt 131.4 lb

## 2024-04-30 DIAGNOSIS — K120 Recurrent oral aphthae: Secondary | ICD-10-CM

## 2024-04-30 NOTE — Patient Instructions (Signed)
 Your ulcers are called aphthous ulcers otherwise known as canker sores

## 2024-04-30 NOTE — Progress Notes (Signed)
 Office Visit  BP 133/82   Pulse (!) 101   Ht 5' 6 (1.676 m)   Wt 131 lb 6.4 oz (59.6 kg)   SpO2 98%   BMI 21.21 kg/m    Subjective:    Patient ID: Lisa Clay, female    DOB: 1971-05-11, 53 y.o.   MRN: [id number]  HPI: Lisa Clay is a 53 y.o. female  Chief Complaint  Patient presents with   Mouth Lesions    Patient states she has been dealing with mouth ulcers. States she has been having swelling as well.     Discussed the use of AI scribe software for clinical note transcription with the patient, who gave verbal consent to proceed.  History of Present Illness   Lisa Clay is a 53 year old female who presents with dry mouth and oral ulcers following medication changes.  She has been experiencing significant dry mouth and oral ulcers after recent changes in her medication regimen. Her medication regimen was adjusted last month, reducing Lexapro  from 20 mg to 10 mg due to the rapid increase. She was also started on Seroquel . Following these changes, she developed dry mouth, brain fog, and oral ulcers.  The ulcers are located under her tongue, on the side of her tongue, and one at the top of her tongue in the very back, where the tongue is swollen. The ulcers are painful, and she reports heat and cold intolerance. She uses a prescription toothpaste and a 'magic mouthwash' prescribed by her dentist, which she uses three to four times a day. She has been using the mouthwash for a couple of days.  Her mouth remains very dry, especially during sleep, and sometimes her mouth 'sticks' to her teeth. She uses biotin gel and lemon drops to stimulate salivary glands and drinks primarily water. She has not noticed significant improvement yet, as she has only been using the mouthwash for a short period.  She mentions a previous allergy to steroids from an injection in her shoulder, and reports that her dentist told her the current mouthwash contains a  steroid.        Relevant past medical, surgical, family and social history reviewed and updated as indicated. Interim medical history since our last visit reviewed. Allergies and medications reviewed and updated.  ROS per HPI unless specifically indicated above     Objective:    BP 133/82   Pulse (!) 101   Ht 5' 6 (1.676 m)   Wt 131 lb 6.4 oz (59.6 kg)   SpO2 98%   BMI 21.21 kg/m   Wt Readings from Last 3 Encounters:  04/30/24 131 lb 6.4 oz (59.6 kg)  03/12/24 136 lb 12.8 oz (62.1 kg)  04/23/23 122 lb (55.3 kg)     Physical Exam Constitutional:      Appearance: Normal appearance.  HENT:     Mouth/Throat:     Mouth: Oral lesions present.     Comments: 1-3 aphthous ulcers throughout Pulmonary:     Effort: Pulmonary effort is normal.  Musculoskeletal:        General: Normal range of motion.  Skin:    Comments: Normal skin color  Neurological:     General: No focal deficit present.     Mental Status: She is alert. Mental status is at baseline.  Psychiatric:        Mood and Affect: Mood normal.        Behavior: Behavior normal.  Thought Content: Thought content normal.         03/12/2024   10:51 AM  Depression screen PHQ 2/9  Decreased Interest 1  Down, Depressed, Hopeless 1  PHQ - 2 Score 2  Altered sleeping 0  Tired, decreased energy 1  Change in appetite 0  Feeling bad or failure about yourself  2  Trouble concentrating 0  Moving slowly or fidgety/restless 0  Suicidal thoughts 0  PHQ-9 Score 5      Data saved with a previous flowsheet row definition       03/12/2024   10:50 AM  GAD 7 : Generalized Anxiety Score  Nervous, Anxious, on Edge 0  Control/stop worrying 0  Worry too much - different things 0  Trouble relaxing 0  Restless 0  Easily annoyed or irritable 0  Afraid - awful might happen 0  Total GAD 7 Score 0       Assessment & Plan:  Assessment & Plan   Aphthous ulcer Aphthous ulcers likely stress-related, not  medication-related from recent medication changes though I explained these can also be idiopathic. Xerostomia may exacerbate discomfort. - Continue prescription mouthwash for two weeks.  - Consider antibacterial mouthwash if no improvement in one week.    Follow up plan: Return if symptoms worsen or fail to improve.  Hadassah SHAUNNA Nett, MD

## 2024-05-11 ENCOUNTER — Encounter: Payer: Self-pay | Admitting: Pediatrics

## 2024-06-15 ENCOUNTER — Ambulatory Visit (INDEPENDENT_AMBULATORY_CARE_PROVIDER_SITE_OTHER): Admitting: Nurse Practitioner

## 2024-06-15 VITALS — BP 133/84 | HR 99 | Temp 97.9°F | Ht 66.0 in | Wt 136.8 lb

## 2024-06-15 DIAGNOSIS — Z136 Encounter for screening for cardiovascular disorders: Secondary | ICD-10-CM | POA: Diagnosis not present

## 2024-06-15 DIAGNOSIS — Z114 Encounter for screening for human immunodeficiency virus [HIV]: Secondary | ICD-10-CM

## 2024-06-15 DIAGNOSIS — Z1159 Encounter for screening for other viral diseases: Secondary | ICD-10-CM | POA: Diagnosis not present

## 2024-06-15 DIAGNOSIS — Z1231 Encounter for screening mammogram for malignant neoplasm of breast: Secondary | ICD-10-CM | POA: Diagnosis not present

## 2024-06-15 DIAGNOSIS — Z1211 Encounter for screening for malignant neoplasm of colon: Secondary | ICD-10-CM

## 2024-06-15 DIAGNOSIS — Z0000 Encounter for general adult medical examination without abnormal findings: Secondary | ICD-10-CM | POA: Diagnosis not present

## 2024-06-15 NOTE — Patient Instructions (Signed)
 Please call to schedule your mammogram and/or bone density: [facility] at [facility]  Address: [address], [place], [place] [address] Phone: [phone]  [facility] at [facility] [address] [place],  [place]  [address] Phone: [phone]

## 2024-06-15 NOTE — Progress Notes (Signed)
 "  BP 133/84 (BP Location: Left Arm, Cuff Size: Normal)   Pulse 99   Temp 97.9 F (36.6 C) (Oral)   Ht 5' 6 (1.676 m)   Wt 136 lb 12.8 oz (62.1 kg)   SpO2 99%   BMI 22.08 kg/m    Subjective:    Patient ID: Lisa Clay, female    DOB: 04-Feb-1971, 54 y.o.   MRN: [id number]  HPI: Lisa Clay is a 54 y.o. female presenting on 06/15/2024 for comprehensive medical examination. Current medical complaints include:none  She currently lives with: Menopausal Symptoms: no  Patient is seeing Gynecology.  She has an appointment tomorrow to discuss bladder options.  She has cut back on how much she is having to do the bladder instills and that has helped her a lot.    Depression Screen done today and results listed below:     06/15/2024   10:20 AM 03/12/2024   10:51 AM  Depression screen PHQ 2/9  Decreased Interest 1 1  Down, Depressed, Hopeless 1 1  PHQ - 2 Score 2 2  Altered sleeping 1 0  Tired, decreased energy 1 1  Change in appetite 0 0  Feeling bad or failure about yourself  2 2  Trouble concentrating 0 0  Moving slowly or fidgety/restless 0 0  Suicidal thoughts 0 0  PHQ-9 Score 6 5   Difficult doing work/chores Somewhat difficult      Data saved with a previous flowsheet row definition    The patient does not have a history of falls. I did complete a risk assessment for falls. A plan of care for falls was documented.   Past Medical History:  Past Medical History:  Diagnosis Date   Allergy    Arthritis    Atrial tachycardia    Depression    Dysrhythmia    Hx: PSVT. Current: Postural Orthostatic Tachycardia Syndrome, Inappropriate Sinus Node Tachycardia   Headache    migraines, rare.   Hyperlipidemia    Kidney stones    Palpitations    [facility] 2/12   Shortness of breath dyspnea    on exertion, secondary to heart issues   Sinus tachycardia    [facility] 2/12   Syncope    secondary to heart issues    Surgical History:  Past Surgical History:   Procedure Laterality Date   BREAST BIOPSY Left 05/11/2013   stereo biopsy   CARDIAC ELECTROPHYSIOLOGY STUDY AND ABLATION  04/24/11   [facility]   CLOSED MANIPULATION SHOULDER WITH STERIOD INJECTION Right 02/16/2015   Procedure: CLOSED MANIPULATION SHOULDER WITH STEROID INJECTION;  Surgeon: Norleen JINNY Maltos, MD;  Location: [facility];  Service: Orthopedics;  Laterality: Right;   FINGER SURGERY Right 12/25/2017   explorative surgery on R5th PIP    TONSILLECTOMY      Medications:  Current Outpatient Medications on File Prior to Visit  Medication Sig   atorvastatin  (LIPITOR) 40 MG tablet Take 1 tablet by mouth daily.   benztropine (COGENTIN) 0.5 MG tablet Take 0.5 mg by mouth 2 (two) times daily.   conjugated estrogens  (PREMARIN ) vaginal cream Discard applicator and Apply pea sized amount to tip of finger to urethra before bed. Wash hands well after application. Use Monday, Wednesday and Friday   CORLANOR  5 MG TABS tablet Take 5 mg by mouth 2 (two) times daily.   COSENTYX SENSOREADY PEN 150 MG/ML SOAJ Inject into the skin every 28 (twenty-eight) days.   cyclobenzaprine (FLEXERIL) 10 MG tablet Take 10 mg  by mouth at bedtime.   escitalopram  (LEXAPRO ) 20 MG tablet Take 1 tablet (20 mg total) by mouth daily. (Patient taking differently: Take 10 mg by mouth daily.)   fludrocortisone  (FLORINEF ) 0.1 MG tablet Take 100 mcg by mouth daily.   folic acid  (FOLVITE ) 1 MG tablet Take 1 mg by mouth 2 (two) times daily at 10 AM and 5 PM. (Patient taking differently: Take 1 mg by mouth daily.)   gabapentin  (NEURONTIN ) 400 MG capsule Take 1 capsule (400 mg total) by mouth 3 (three) times daily. (Patient taking differently: Take 1,200 mg by mouth at bedtime.)   indomethacin  (INDOCIN ) 50 MG capsule Take 50 mg by mouth 2 (two) times daily with a meal.   methenamine  (HIPREX ) 1 g tablet Take 1 g by mouth 2 (two) times daily with a meal.   methotrexate  (RHEUMATREX) 2.5 MG tablet Take 15 mg by mouth.    metoprolol  succinate (TOPROL -XL) 25 MG 24 hr tablet Take 12.5 mg by mouth daily.   nortriptyline  (PAMELOR ) 10 MG capsule Take 10 mg by mouth.   phenazopyridine  (PYRIDIUM ) 100 MG tablet Take 100 mg by mouth 2 (two) times daily as needed for pain.   QUEtiapine  (SEROQUEL ) 100 MG tablet Take 1 tablet (100 mg total) by mouth at bedtime. (Patient taking differently: Take 150 mg by mouth at bedtime.)   topiramate  (TOPAMAX ) 50 MG tablet Take 1 tablet (50 mg total) by mouth in the morning, at noon, and at bedtime.   [DISCONTINUED] levonorgestrel (MIRENA) 20 MCG/24HR IUD 1 each by Intrauterine route once.     [DISCONTINUED] propranolol (INDERAL) 20 MG tablet TAKE 1 TABLET (20 MG TOTAL) BY MOUTH 4 TIMES DAILY.   No current facility-administered medications on file prior to visit.    Allergies:  Allergies[1]  Social History:  Social History   Socioeconomic History   Marital status: Divorced    Spouse name: Not on file   Number of children: Not on file   Years of education: Not on file   Highest education level: Bachelor's degree (e.g., BA, AB, BS)  Occupational History   Not on file  Tobacco Use   Smoking status: Never   Smokeless tobacco: Never   Tobacco comments:    tobacco use - no   Vaping Use   Vaping status: Never Used  Substance and Sexual Activity   Alcohol use: Never    Comment: rare   Drug use: Never   Sexual activity: Not Currently    Birth control/protection: Post-menopausal  Other Topics Concern   Not on file  Social History Narrative   Single; full time; does not get regular exercise.       Patient is disabled   Social Drivers of Health   Tobacco Use: Low Risk (06/15/2024)   Patient History    Smoking Tobacco Use: Never    Smokeless Tobacco Use: Never    Passive Exposure: Not on file  Financial Resource Strain: Low Risk (06/15/2024)   Overall Financial Resource Strain (CARDIA)    Difficulty of Paying Living Expenses: Not hard at all  Food Insecurity: No Food  Insecurity (06/15/2024)   Epic    Worried About Programme Researcher, Broadcasting/film/video in the Last Year: Never true    Ran Out of Food in the Last Year: Never true  Transportation Needs: No Transportation Needs (06/15/2024)   Epic    Lack of Transportation (Medical): No    Lack of Transportation (Non-Medical): No  Physical Activity: Unknown (06/15/2024)   Exercise Vital Sign  Days of Exercise per Week: Patient declined    Minutes of Exercise per Session: Not on file  Stress: Stress Concern Present (06/15/2024)   [employer] of Occupational Health - Occupational Stress Questionnaire    Feeling of Stress: Rather much  Social Connections: Moderately Isolated (06/15/2024)   Social Connection and Isolation Panel    Frequency of Communication with Friends and Family: More than three times a week    Frequency of Social Gatherings with Friends and Family: Once a week    Attends Religious Services: 1 to 4 times per year    Active Member of [employer] or Organizations: No    Attends Engineer, Structural: Not on file    Marital Status: Divorced  Intimate Partner Violence: Patient Declined (01/23/2024)   Epic    Fear of Current or Ex-Partner: Patient declined    Emotionally Abused: Patient declined    Physically Abused: Patient declined    Sexually Abused: Patient declined  Depression (PHQ2-9): Medium Risk (06/15/2024)   Depression (PHQ2-9)    PHQ-2 Score: 6  Alcohol Screen: Low Risk (01/23/2024)   Alcohol Screen    Last Alcohol Screening Score (AUDIT): 0  Housing: Low Risk (06/15/2024)   Epic    Unable to Pay for Housing in the Last Year: No    Number of Times Moved in the Last Year: 0    Homeless in the Last Year: No  Utilities: Not At Risk (02/14/2024)   Received from [facility]   Epic    In the past 12 months has the electric, gas, oil, or water company threatened to shut off services in your home?: No  Health Literacy: Adequate Health Literacy (02/14/2024)   Received from [facility]   [phone] Health Literacy    How often do you need to have someone help you when you read instructions, pamphlets, or other written material from your doctor or pharmacy?: Never   Tobacco Use History[2] Social History   Substance and Sexual Activity  Alcohol Use Never   Comment: rare    Family History:  Family History  Problem Relation Age of Onset   Heart attack Maternal Grandmother 50       deceased   Stroke Maternal Grandmother    Hypertension Father        and high cholesterol    Hyperlipidemia Father    Hypertension Mother        and high cholesterol    ADD / ADHD Mother    Anxiety disorder Mother    Arthritis Mother    Depression Mother    Hyperlipidemia Mother    Kidney disease Mother    ADD / ADHD Daughter    Anxiety disorder Daughter    Breast cancer Neg Hx     Past medical history, surgical history, medications, allergies, family history and social history reviewed with patient today and changes made to appropriate areas of the chart.   Review of Systems  All other systems reviewed and are negative.  All other ROS negative except what is listed above and in the HPI.      Objective:    BP 133/84 (BP Location: Left Arm, Cuff Size: Normal)   Pulse 99   Temp 97.9 F (36.6 C) (Oral)   Ht 5' 6 (1.676 m)   Wt 136 lb 12.8 oz (62.1 kg)   SpO2 99%   BMI 22.08 kg/m   Wt Readings from Last 3 Encounters:  06/15/24 136 lb  12.8 oz (62.1 kg)  04/30/24 131 lb 6.4 oz (59.6 kg)  03/12/24 136 lb 12.8 oz (62.1 kg)    Physical Exam Vitals and nursing note reviewed.  Constitutional:      General: She is awake. She is not in acute distress.    Appearance: She is well-developed. She is not ill-appearing.  HENT:     Head: Normocephalic and atraumatic.     Right Ear: Hearing, tympanic membrane, ear canal and external ear normal. No drainage.     Left Ear: Hearing, tympanic membrane, ear canal and external ear normal. No drainage.     Nose:  Nose normal.     Right Sinus: No maxillary sinus tenderness or frontal sinus tenderness.     Left Sinus: No maxillary sinus tenderness or frontal sinus tenderness.     Mouth/Throat:     Mouth: Mucous membranes are moist.     Pharynx: Oropharynx is clear. Uvula midline. No pharyngeal swelling, oropharyngeal exudate or posterior oropharyngeal erythema.  Eyes:     General: Lids are normal.        Right eye: No discharge.        Left eye: No discharge.     Extraocular Movements: Extraocular movements intact.     Conjunctiva/sclera: Conjunctivae normal.     Pupils: Pupils are equal, round, and reactive to light.     Visual Fields: Right eye visual fields normal and left eye visual fields normal.  Neck:     Thyroid: No thyromegaly.     Vascular: No carotid bruit.     Trachea: Trachea normal.  Cardiovascular:     Rate and Rhythm: Normal rate and regular rhythm.     Heart sounds: Normal heart sounds. No murmur heard.    No gallop.  Pulmonary:     Effort: Pulmonary effort is normal. No accessory muscle usage or respiratory distress.     Breath sounds: Normal breath sounds.  Chest:  Breasts:    Right: Normal.     Left: Normal.  Abdominal:     General: Bowel sounds are normal.     Palpations: Abdomen is soft. There is no hepatomegaly or splenomegaly.     Tenderness: There is no abdominal tenderness.  Musculoskeletal:        General: Normal range of motion.     Cervical back: Normal range of motion and neck supple.     Right lower leg: No edema.     Left lower leg: No edema.  Lymphadenopathy:     Head:     Right side of head: No submental, submandibular, tonsillar, preauricular or posterior auricular adenopathy.     Left side of head: No submental, submandibular, tonsillar, preauricular or posterior auricular adenopathy.     Cervical: No cervical adenopathy.     Upper Body:     Right upper body: No supraclavicular, axillary or pectoral adenopathy.     Left upper body: No  supraclavicular, axillary or pectoral adenopathy.  Skin:    General: Skin is warm and dry.     Capillary Refill: Capillary refill takes less than 2 seconds.     Findings: No rash.  Neurological:     Mental Status: She is alert and oriented to person, place, and time.     Gait: Gait is intact.  Psychiatric:        Attention and Perception: Attention normal.        Mood and Affect: Mood normal.        Speech: Speech  normal.        Behavior: Behavior normal. Behavior is cooperative.        Thought Content: Thought content normal.        Judgment: Judgment normal.     Results for orders placed or performed during the hospital encounter of 01/22/24  Comprehensive metabolic panel   Collection Time: 01/22/24  6:22 AM  Result Value Ref Range   Sodium 140 135 - 145 mmol/L   Potassium 3.9 3.5 - 5.1 mmol/L   Chloride 105 98 - 111 mmol/L   CO2 27 22 - 32 mmol/L   Glucose, Bld 109 (H) 70 - 99 mg/dL   BUN 19 6 - 20 mg/dL   Creatinine, Ser 9.30 0.44 - 1.00 mg/dL   Calcium  9.0 8.9 - 10.3 mg/dL   Total Protein 7.0 6.5 - 8.1 g/dL   Albumin 3.7 3.5 - 5.0 g/dL   AST 78 (H) 15 - 41 U/L   ALT 88 (H) 0 - 44 U/L   Alkaline Phosphatase 73 38 - 126 U/L   Total Bilirubin 0.5 0.0 - 1.2 mg/dL   GFR, Estimated >39 >39 mL/min   Anion gap 8 5 - 15  Ethanol   Collection Time: 01/22/24  6:22 AM  Result Value Ref Range   Alcohol, Ethyl (B) <15 <15 mg/dL  CBC with Diff   Collection Time: 01/22/24  6:22 AM  Result Value Ref Range   WBC 4.2 4.0 - 10.5 K/uL   RBC 3.99 3.87 - 5.11 MIL/uL   Hemoglobin 11.7 (L) 12.0 - 15.0 g/dL   HCT 63.2 63.9 - 53.9 %   MCV 92.0 80.0 - 100.0 fL   MCH 29.3 26.0 - 34.0 pg   MCHC 31.9 30.0 - 36.0 g/dL   RDW 86.2 88.4 - 84.4 %   Platelets 432 (H) 150 - 400 K/uL   nRBC 0.0 0.0 - 0.2 %   Neutrophils Relative % 56 %   Neutro Abs 2.3 1.7 - 7.7 K/uL   Lymphocytes Relative 29 %   Lymphs Abs 1.2 0.7 - 4.0 K/uL   Monocytes Relative 8 %   Monocytes Absolute 0.4 0.1 - 1.0 K/uL    Eosinophils Relative 6 %   Eosinophils Absolute 0.3 0.0 - 0.5 K/uL   Basophils Relative 1 %   Basophils Absolute 0.1 0.0 - 0.1 K/uL   Immature Granulocytes 0 %   Abs Immature Granulocytes 0.01 0.00 - 0.07 K/uL  Acetaminophen  level   Collection Time: 01/22/24  6:22 AM  Result Value Ref Range   Acetaminophen  (Tylenol ), Serum <10 (L) 10 - 30 ug/mL  Salicylate level   Collection Time: 01/22/24  6:22 AM  Result Value Ref Range   Salicylate Lvl <7.0 (L) 7.0 - 30.0 mg/dL  Urine Drug Screen, Qualitative   Collection Time: 01/22/24  6:36 AM  Result Value Ref Range   Tricyclic, Ur Screen POSITIVE (A) NONE DETECTED   Amphetamines, Ur Screen NONE DETECTED NONE DETECTED   MDMA (Ecstasy)Ur Screen NONE DETECTED NONE DETECTED   Cocaine Metabolite,Ur [place] NONE DETECTED NONE DETECTED   Opiate, Ur Screen NONE DETECTED NONE DETECTED   Phencyclidine (PCP) Ur S NONE DETECTED NONE DETECTED   Cannabinoid 50 Ng, Ur [place] NONE DETECTED NONE DETECTED   Barbiturates, Ur Screen NONE DETECTED NONE DETECTED   Benzodiazepine, Ur Scrn POSITIVE (A) NONE DETECTED   Methadone Scn, Ur NONE DETECTED NONE DETECTED  Urinalysis, Routine w reflex microscopic -Urine, Clean Catch   Collection Time: 01/22/24  6:36 AM  Result Value Ref Range   Color, Urine YELLOW (A) YELLOW   APPearance HAZY (A) CLEAR   Specific Gravity, Urine 1.012 1.005 - 1.030   pH 7.0 5.0 - 8.0   Glucose, UA NEGATIVE NEGATIVE mg/dL   Hgb urine dipstick SMALL (A) NEGATIVE   Bilirubin Urine NEGATIVE NEGATIVE   Ketones, ur NEGATIVE NEGATIVE mg/dL   Protein, ur 899 (A) NEGATIVE mg/dL   Nitrite NEGATIVE NEGATIVE   Leukocytes,Ua NEGATIVE NEGATIVE   RBC / HPF 6-10 0 - 5 RBC/hpf   WBC, UA 6-10 0 - 5 WBC/hpf   Bacteria, UA FEW (A) NONE SEEN   Squamous Epithelial / HPF 0-5 0 - 5 /HPF   Mucus PRESENT   Pregnancy, urine   Collection Time: 01/22/24  6:36 AM  Result Value Ref Range   Preg Test, Ur NEGATIVE NEGATIVE  CBG monitoring, ED   Collection Time:  01/23/24  2:48 AM  Result Value Ref Range   Glucose-Capillary 129 (H) 70 - 99 mg/dL      Assessment & Plan:   Problem List Items Addressed This Visit   None Visit Diagnoses       Annual physical exam    -  Primary   Health maintenance reviewed during visit today.  Labs ordered.  Vaccines reviewed.  PAP will be done at Gynecology. Mammogram/Cologuard ordered.   Relevant Orders   CBC with Differential/Platelet   Comprehensive metabolic panel with GFR   Lipid panel   TSH   HIV Antibody (routine testing w rflx)   Hepatitis C antibody     Screening for ischemic heart disease       Relevant Orders   Lipid panel     Encounter for hepatitis C screening test for low risk patient       Relevant Orders   Hepatitis C antibody     Screening for HIV (human immunodeficiency virus)       Relevant Orders   HIV Antibody (routine testing w rflx)     Encounter for screening mammogram for malignant neoplasm of breast       Relevant Orders   MM 3D SCREENING MAMMOGRAM BILATERAL BREAST     Screening for colon cancer       Relevant Orders   Cologuard        Follow up plan: Return in about 3 months (around 09/13/2024) for Routine FOllow up.   LABORATORY TESTING:  - Pap smear: Will get at GYN  IMMUNIZATIONS:   - Tdap: Tetanus vaccination status reviewed: last tetanus booster within 10 years. - Influenza: Refused - Pneumovax: Refused - Prevnar: Refused - COVID: Refused - HPV: Not applicable - Shingrix vaccine: Refused  SCREENING: -Mammogram: Ordered today  - Colonoscopy: Ordered today  - Bone Density: Not applicable  -Hearing Test: Not applicable  -Spirometry: Not applicable   PATIENT COUNSELING:   Advised to take 1 mg of folate supplement per day if capable of pregnancy.   Sexuality: Discussed sexually transmitted diseases, partner selection, use of condoms, avoidance of unintended pregnancy  and contraceptive alternatives.   Advised to avoid cigarette smoking.  I discussed  with the patient that most people either abstain from alcohol or drink within safe limits (<=14/week and <=4 drinks/occasion for males, <=7/weeks and <= 3 drinks/occasion for females) and that the risk for alcohol disorders and other health effects rises proportionally with the number of drinks per week and how often a drinker exceeds daily limits.  Discussed cessation/primary prevention of drug use and availability of  treatment for abuse.   Diet: Encouraged to adjust caloric intake to maintain  or achieve ideal body weight, to reduce intake of dietary saturated fat and total fat, to limit sodium intake by avoiding high sodium foods and not adding table salt, and to maintain adequate dietary potassium and calcium  preferably from fresh fruits, vegetables, and low-fat dairy products.    stressed the importance of regular exercise  Injury prevention: Discussed safety belts, safety helmets, smoke detector, smoking near bedding or upholstery.   Dental health: Discussed importance of regular tooth brushing, flossing, and dental visits.    NEXT PREVENTATIVE PHYSICAL DUE IN 1 YEAR. Return in about 3 months (around 09/13/2024) for Routine FOllow up.             [1]  Allergies Allergen Reactions   Erythromycin Nausea And Vomiting    Other Reaction(s): GI Intolerance, Other (See Comments)   Corticosteroids Hives and Rash  [2]  Social History Tobacco Use  Smoking Status Never  Smokeless Tobacco Never  Tobacco Comments   tobacco use - no    "

## 2024-06-16 ENCOUNTER — Ambulatory Visit: Payer: Self-pay | Admitting: Nurse Practitioner

## 2024-06-16 DIAGNOSIS — R7989 Other specified abnormal findings of blood chemistry: Secondary | ICD-10-CM

## 2024-06-16 LAB — CBC WITH DIFFERENTIAL/PLATELET
Basophils Absolute: 0 x10E3/uL (ref 0.0–0.2)
Basos: 1 %
EOS (ABSOLUTE): 0.1 x10E3/uL (ref 0.0–0.4)
Eos: 4 %
Hematocrit: 38.8 % (ref 34.0–46.6)
Hemoglobin: 12.3 g/dL (ref 11.1–15.9)
Immature Grans (Abs): 0 x10E3/uL (ref 0.0–0.1)
Immature Granulocytes: 0 %
Lymphocytes Absolute: 1.2 x10E3/uL (ref 0.7–3.1)
Lymphs: 34 %
MCH: 30.4 pg (ref 26.6–33.0)
MCHC: 31.7 g/dL (ref 31.5–35.7)
MCV: 96 fL (ref 79–97)
Monocytes Absolute: 0.2 x10E3/uL (ref 0.1–0.9)
Monocytes: 6 %
Neutrophils Absolute: 1.9 x10E3/uL (ref 1.4–7.0)
Neutrophils: 55 %
Platelets: 287 x10E3/uL (ref 150–450)
RBC: 4.04 x10E6/uL (ref 3.77–5.28)
RDW: 14 % (ref 11.7–15.4)
WBC: 3.4 x10E3/uL (ref 3.4–10.8)

## 2024-06-16 LAB — LIPID PANEL
Chol/HDL Ratio: 2.1 ratio (ref 0.0–4.4)
Cholesterol, Total: 181 mg/dL (ref 100–199)
HDL: 87 mg/dL
LDL Chol Calc (NIH): 84 mg/dL (ref 0–99)
Triglycerides: 47 mg/dL (ref 0–149)
VLDL Cholesterol Cal: 10 mg/dL (ref 5–40)

## 2024-06-16 LAB — HEPATITIS C ANTIBODY: Hep C Virus Ab: NONREACTIVE

## 2024-06-16 LAB — COMPREHENSIVE METABOLIC PANEL WITH GFR
ALT: 45 IU/L — ABNORMAL HIGH (ref 0–32)
AST: 37 IU/L (ref 0–40)
Albumin: 4.5 g/dL (ref 3.8–4.9)
Alkaline Phosphatase: 110 IU/L (ref 49–135)
BUN/Creatinine Ratio: 20 (ref 9–23)
BUN: 17 mg/dL (ref 6–24)
Bilirubin Total: 0.7 mg/dL (ref 0.0–1.2)
CO2: 22 mmol/L (ref 20–29)
Calcium: 9.4 mg/dL (ref 8.7–10.2)
Chloride: 103 mmol/L (ref 96–106)
Creatinine, Ser: 0.84 mg/dL (ref 0.57–1.00)
Globulin, Total: 2.8 g/dL (ref 1.5–4.5)
Glucose: 101 mg/dL — ABNORMAL HIGH (ref 70–99)
Potassium: 3.8 mmol/L (ref 3.5–5.2)
Sodium: 141 mmol/L (ref 134–144)
Total Protein: 7.3 g/dL (ref 6.0–8.5)
eGFR: 83 mL/min/1.73

## 2024-06-16 LAB — HIV ANTIBODY (ROUTINE TESTING W REFLEX): HIV Screen 4th Generation wRfx: NONREACTIVE

## 2024-06-16 LAB — TSH: TSH: 5.2 u[IU]/mL — ABNORMAL HIGH (ref 0.450–4.500)

## 2024-06-30 ENCOUNTER — Other Ambulatory Visit

## 2024-06-30 DIAGNOSIS — R7989 Other specified abnormal findings of blood chemistry: Secondary | ICD-10-CM

## 2024-07-01 ENCOUNTER — Ambulatory Visit: Payer: Self-pay | Admitting: Nurse Practitioner

## 2024-07-01 LAB — TSH: TSH: 2.76 u[IU]/mL (ref 0.450–4.500)

## 2024-07-01 LAB — T4, FREE: Free T4: 0.9 ng/dL (ref 0.82–1.77)

## 2024-07-29 ENCOUNTER — Encounter

## 2024-09-15 ENCOUNTER — Ambulatory Visit: Admitting: Nurse Practitioner
